# Patient Record
Sex: Female | Born: 1990 | Race: Black or African American | Hispanic: No | Marital: Married | Smoking: Current every day smoker
Health system: Southern US, Community
[De-identification: ages and names within clinical notes are randomized; demographics above are authoritative.]

## PROBLEM LIST (undated history)

## (undated) ENCOUNTER — Inpatient Hospital Stay (HOSPITAL_COMMUNITY): Payer: Self-pay

## (undated) DIAGNOSIS — R7689 Other specified abnormal immunological findings in serum: Secondary | ICD-10-CM

## (undated) DIAGNOSIS — O24419 Gestational diabetes mellitus in pregnancy, unspecified control: Secondary | ICD-10-CM

## (undated) DIAGNOSIS — Z8759 Personal history of other complications of pregnancy, childbirth and the puerperium: Secondary | ICD-10-CM

## (undated) DIAGNOSIS — R51 Headache: Secondary | ICD-10-CM

## (undated) DIAGNOSIS — Z8619 Personal history of other infectious and parasitic diseases: Secondary | ICD-10-CM

## (undated) HISTORY — PX: WISDOM TOOTH EXTRACTION: SHX21

---
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
## 2012-03-23 ENCOUNTER — Encounter (HOSPITAL_COMMUNITY): Payer: Self-pay

## 2012-03-23 ENCOUNTER — Inpatient Hospital Stay (HOSPITAL_COMMUNITY)
Admission: AD | Admit: 2012-03-23 | Discharge: 2012-03-23 | Disposition: A | Discharge status: Home / Self Care | Payer: Medicaid Other | Source: Ambulatory Visit | Attending: Obstetrics & Gynecology | Admitting: Obstetrics & Gynecology

## 2012-03-23 DIAGNOSIS — O99891 Other specified diseases and conditions complicating pregnancy: Secondary | ICD-10-CM | POA: Insufficient documentation

## 2012-03-23 DIAGNOSIS — Z3201 Encounter for pregnancy test, result positive: Secondary | ICD-10-CM

## 2012-03-23 NOTE — Discharge Instructions (Signed)
Prenatal Care  WHAT IS PRENATAL CARE?  Prenatal care means health care during your pregnancy, before your baby is born. Take care of yourself and your baby by:   Getting early prenatal care. If you know you are pregnant, or think you might be pregnant, call your caregiver as soon as possible. Schedule a visit for a general/prenatal examination.   Getting regular prenatal care. Follow your caregiver's schedule for blood and other necessary tests. Do not miss appointments.   Do everything you can to keep yourself and your baby healthy during your pregnancy.   Prenatal care should include evaluation of medical, dietary, educational, psychological, and social needs for the couple and the medical, surgical, and genetic history of the family of the mother and father.   Discuss with your caregiver:   Your medicines, prescription, over-the-counter, and herbal medicines.   Substance abuse, alcohol, smoking, and illegal drugs.   Domestic abuse and violence, if present.   Your immunizations.   Nutrition and diet.   Exercising.   Environment and occupational hazards, at home and at work.   History of sexually transmitted disease, both you and your partner.   Previous pregnancies.  WHY IS PRENATAL CARE SO IMPORTANT?  By seeing you regularly, your caregiver has the chance to find problems early, so that they can be treated as soon as possible. Other problems might be prevented. Many studies have shown that early and regular prenatal care is important for the health of both mothers and their babies.  I AM THINKING ABOUT GETTING PREGNANT. HOW CAN I TAKE CARE OF MYSELF?  Taking care of yourself before you get pregnant helps you to have a healthy pregnancy. It also lowers your chances of having a baby born with a birth defect. Here are ways to take care of yourself before you get pregnant:   Eat healthy foods, exercise regularly (30 minutes per day for most days of the week is best), and get enough  rest and sleep. Talk to your caregiver about what kinds of foods and exercises are best for you.   Take 400 micrograms (mcg) of folic acid (one of the B vitamins) every day. The best way to do this is to take a daily multivitamin pill that contains this amount of folic acid. Getting enough of the synthetic (manufactured) form of folic acid every day before you get pregnant and during early pregnancy can help prevent certain birth defects. Many breakfast cereals and other grain products have folic acid added to them, but only certain cereals contain 400 mcg of folic acid per serving. Check the label on your multivitamin or cereal to find the amount of folic acid in the food.   See your caregiver for a complete check up before getting pregnant. Make sure that you have had all your immunization shots, especially for rubella (German measles). Rubella can cause serious birth defects. Chickenpox is another illness you want to avoid during pregnancy. If you have had chickenpox and rubella in the past, you should be immune to them.   Tell your caregiver about any prescription or non-prescription medicines (including herbal remedies) you are taking. Some medicines are not safe to take during pregnancy.   Stop smoking cigarettes, drinking alcohol, or taking illegal drugs. Ask your caregiver for help, if you need it. You can also get help with alcohol and drugs by talking with a member of your faith community, a counselor, or a trusted friend.   Discuss and treat any medical, social, or psychological   problems before getting pregnant.   Discuss any history of genetic problems in the mother, father, and their families. Do genetic testing before getting pregnant, when possible.   Discuss any physical or emotional abuse with your caregiver.   Discuss with your caregiver if you might be exposed to harmful chemicals on your job or where you live.   Discuss with your caregiver if you think your job or the hours you  work may be harmful and should be changed.   The father should be involved with the decision making and with all aspects of the pregnancy, labor, and delivery.   If you have medical insurance, make sure you are covered for pregnancy.  I JUST FOUND OUT THAT I AM PREGNANT. HOW CAN I TAKE CARE OF MYSELF?  Here are ways to take care of yourself and the precious new life growing inside you:   Continue taking your multivitamin with 400 micrograms (mcg) of folic acid every day.   Get early and regular prenatal care. It does not matter if this is your first pregnancy or if you already have children. It is very important to see a caregiver during your pregnancy. Your caregiver will check at each visit to make sure that you and the baby are healthy. If there are any problems, action can be taken right away to help you and the baby.   Eat a healthy diet that includes:   Fruits.   Vegetables.   Foods low in saturated fat.   Grains.   Calcium-rich foods.   Drink 6 to 8 glasses of liquids a day.   Unless your caregiver tells you not to, try to be physically active for 30 minutes, most days of the week. If you are pressed for time, you can get your activity in through 10 minute segments, three times a day.   If you smoke, drink alcohol, or use drugs, STOP. These can cause long-term damage to your baby. Talk with your caregiver about steps to take to stop smoking. Talk with a member of your faith community, a counselor, a trusted friend, or your caregiver if you are concerned about your alcohol or drug use.   Ask your caregiver before taking any medicine, even over-the-counter medicines. Some medicines are not safe to take during pregnancy.   Get plenty of rest and sleep.   Avoid hot tubs and saunas during pregnancy.   Do not have X-rays taken, unless absolutely necessary and with the recommendation of your caregiver. A lead shield can be placed on your abdomen, to protect the baby when X-rays  are taken in other parts of the body.   Do not empty the cat litter when you are pregnant. It may contain a parasite that causes an infection called toxoplasmosis, which can cause birth defects. Also, use gloves when working in garden areas used by cats.   Do not eat uncooked or undercooked cheese, meats, or fish.   Stay away from toxic chemicals like:   Insecticides.   Solvents (some cleaners or paint thinners).   Lead.   Mercury.   Sexual relations may continue until the end of the pregnancy, unless you have a medical problem or there is a problem with the pregnancy and your caregiver tells you not to.   Do not wear high heel shoes, especially during the second half of the pregnancy. You can lose your balance and fall.   Do not take long trips, unless absolutely necessary. Be sure to see your caregiver before   going on the trip.   Do not sit in one position for more than 2 hours, when on a trip.   Take a copy of your medical records when going on a trip.   Know where there is a hospital in the city you are visiting, in case of an emergency.   Most dangerous household products will have pregnancy warnings on their labels. Ask your caregiver about products if you are unsure.   Limit or eliminate your caffeine intake from coffee, tea, sodas, medicines, and chocolate.   Many women continue working through pregnancy. Staying active might help you stay healthier. If you have a question about the safety or the hours you work at your particular job, talk with your caregiver.   Get informed:   Read books.   Watch videos.   Go to childbirth classes for you and the father.   Talk with experienced moms.   Ask your caregiver about childbirth education classes for you and your partner. Classes can help you and your partner prepare for the birth of your baby.   Ask about a pediatrician (baby doctor) and methods and pain medicine for labor, delivery, and possible Cesarean delivery  (C-section).  I AM NOT THINKING ABOUT GETTING PREGNANT RIGHT NOW, BUT HEARD THAT ALL WOMEN SHOULD TAKE FOLIC ACID EVERY DAY?  All women of childbearing age, with even a remote chance of getting pregnant, should try to make sure they get enough folic acid. Many pregnancies are not planned. Many women do not know they are actually pregnant early in their pregnancies, and certain birth defects happen in the very early part of pregnancy. Taking 400 micrograms (mcg) of folic acid every day will help prevent certain birth defects that happen in the early part of pregnancy. If a woman begins taking vitamin pills in the second or third month of pregnancy, it may be too late to prevent birth defects. Folic acid may also have other health benefits for women, besides preventing birth defects.  HOW OFTEN SHOULD I SEE MY CAREGIVER DURING PREGNANCY?  Your caregiver will give you a schedule for your prenatal visits. You will have visits more often as you get closer to the end of your pregnancy. An average pregnancy lasts about 40 weeks.  A typical schedule includes visiting your caregiver:   About once each month, during your first 6 months of pregnancy.   Every 2 weeks, during the next 2 months.   Weekly in the last month, until the delivery date.  Your caregiver will probably want to see you more often if:  You are over 35.   Your pregnancy is high risk, because you have certain health problems or problems with the pregnancy, such as:   Diabetes.   High blood pressure.   The baby is not growing on schedule, according to the dates of the pregnancy.  Your caregiver will do special tests, to make sure you and the baby are not having any serious problems. WHAT HAPPENS DURING PRENATAL VISITS?   At your first prenatal visit, your caregiver will talk to you about you and your partner's health history and your family's health history, and will do a physical exam.   On your first visit, a physical exam will  include checks of your blood pressure, height and weight, and an exam of your pelvic organs. Your caregiver will do a Pap test if you have not had one recently, and will do cultures of your cervix to make sure there is no   infection.   At each visit, there will be tests of your blood, urine, blood pressure, weight, and checking the progress of the baby.   Your caregiver will be able to tell you when to expect that your baby will be born.   Each visit is also a chance for you to learn about staying healthy during pregnancy and for asking questions.   Discuss whether you will be breastfeeding.   At your later prenatal visits, your caregiver will check how you are doing and how the baby is developing. You may have a number of tests done as your pregnancy progresses.   Ultrasound exams are often used to check on the baby's growth and health.   You may have more urine and blood tests, as well as special tests, if needed. These may include amniocentesis (examine fluid in the pregnancy sac), stress tests (check how baby responds to contractions), biophysical profile (measures fetus well-being). Your caregiver will explain the tests and why they are necessary.  I AM IN MY LATE THIRTIES, AND I WANT TO HAVE A CHILD NOW. SHOULD I DO ANYTHING SPECIAL?  As you get older, there is more chance of having a medical problem (high blood pressure), pregnancy problem (preeclampsia, problems with the placenta), miscarriage, or a baby born with a birth defect. However, most women in their late thirties and early forties have healthy babies. See your caregiver on a regular basis before you get pregnant and be sure to go for exams throughout your pregnancy. Your caregiver probably will want to do some special tests to check on you and your baby's health when you are pregnant.  Women today are often delaying having children until later in life, when they are in their thirties and forties. While many women in their thirties  and forties have no difficulty getting pregnant, fertility does decline with age. For women over 40 who cannot get pregnant after 6 months of trying, it is recommended that they see their caregiver for a fertility evaluation. It is not uncommon to have trouble becoming pregnant or experience infertility (inability to become pregnant after trying for one year). If you think that you or your partner may be infertile, you can discuss this with your caregiver. He or she can recommend treatments such as drugs, surgery, or assisted reproductive technology.  Document Released: 10/13/2003 Document Revised: 09/29/2011 Document Reviewed: 09/09/2009 [employer] Patient Information 2012 [employer] 

## 2012-03-23 NOTE — ED Provider Notes (Signed)
This is a 21 y.o. female at [redacted]w[redacted]d who presents for Proof of Pregnancy letter, in order to get her Medicaid.  She has no complaints other than breast tenderness.   She has not chosen a prenatal provider yet.   Letter was provided.

## 2012-03-23 NOTE — MAU Note (Signed)
Patient states she has done a home pregnancy test that was positive. Wants proof of pregnancy. Denies any pain or bleeding. Does have tender breasts.

## 2012-04-28 ENCOUNTER — Emergency Department (HOSPITAL_COMMUNITY)
Admission: EM | Admit: 2012-04-28 | Discharge: 2012-04-28 | Disposition: A | Discharge status: Home / Self Care | Payer: Medicaid Other | Attending: Emergency Medicine | Admitting: Emergency Medicine

## 2012-04-28 ENCOUNTER — Encounter (HOSPITAL_COMMUNITY): Payer: Self-pay | Admitting: *Deleted

## 2012-04-28 DIAGNOSIS — O99891 Other specified diseases and conditions complicating pregnancy: Secondary | ICD-10-CM | POA: Insufficient documentation

## 2012-04-28 DIAGNOSIS — L03317 Cellulitis of buttock: Secondary | ICD-10-CM | POA: Insufficient documentation

## 2012-04-28 DIAGNOSIS — L0231 Cutaneous abscess of buttock: Secondary | ICD-10-CM | POA: Insufficient documentation

## 2012-04-28 DIAGNOSIS — O99330 Smoking (tobacco) complicating pregnancy, unspecified trimester: Secondary | ICD-10-CM | POA: Insufficient documentation

## 2012-04-28 DIAGNOSIS — Z3490 Encounter for supervision of normal pregnancy, unspecified, unspecified trimester: Secondary | ICD-10-CM

## 2012-04-28 MED ORDER — LIDOCAINE HCL (PF) 1 % IJ SOLN
5.0000 mL | Freq: Once | INTRAMUSCULAR | Status: AC
  Administered 2012-04-28: 5 mL
  Filled 2012-04-28: qty 5

## 2012-04-28 MED ORDER — OXYCODONE-ACETAMINOPHEN 5-325 MG PO TABS
1.0000 | ORAL_TABLET | Freq: Once | ORAL | Status: AC
  Administered 2012-04-28: 1 via ORAL
  Filled 2012-04-28: qty 1

## 2012-04-28 MED ORDER — SULFAMETHOXAZOLE-TMP DS 800-160 MG PO TABS
1.0000 | ORAL_TABLET | Freq: Once | ORAL | Status: AC
  Administered 2012-04-28: 1 via ORAL
  Filled 2012-04-28: qty 1

## 2012-04-28 MED ORDER — SULFAMETHOXAZOLE-TRIMETHOPRIM 800-160 MG PO TABS: 1.0000 | ORAL_TABLET | Freq: Two times a day (BID) | ORAL | Status: DC

## 2012-04-28 MED ORDER — OXYCODONE-ACETAMINOPHEN 5-325 MG PO TABS: 1.0000 | ORAL_TABLET | ORAL | Status: DC | PRN

## 2012-04-28 NOTE — ED Provider Notes (Signed)
History     CSN: [id number]  Arrival date & time 04/28/12  1305   First MD Initiated Contact with Patient 04/28/12 1511      Chief Complaint  Patient presents with  . Recurrent Skin Infections    (Consider location/radiation/quality/duration/timing/severity/associated sxs/prior treatment) The history is provided by the patient.   old Kokesh female is currently pregnant and has noted an area of pain in her right buttock. Pain is has been getting progressively worse. It is worse with palpation worse with sitting on it. Pain is severe and rated at 10/10. She denies fever, chills, sweats. She thinks it is a boil and she has been applying  Boil-Eze with no relief.  Past Medical History  Diagnosis Date  . No pertinent past medical history     Past Surgical History  Procedure Date  . Wisdom tooth extraction     Family History  Problem Relation Age of Onset  . Hypotension Neg Hx     History  Substance Use Topics  . Smoking status: Current Everyday Smoker -- 0.5 packs/day    Types: Cigarettes  . Smokeless tobacco: Not on file  . Alcohol Use: No    OB History    Grav Para Term Preterm Abortions TAB SAB Ect Mult Living   1               Review of Systems  All other systems reviewed and are negative.    Allergies  Review of patient's allergies indicates no known allergies.  Home Medications   Current Outpatient Rx  Name Route Sig Dispense Refill  . ONDANSETRON HCL 8 MG PO TABS Oral Take 8 mg by mouth every 8 (eight) hours as needed. For nausea    . PRENATAL MULTIVITAMIN CH Oral Take 1 tablet by mouth daily.      BP 119/67  Pulse 118  Temp 99 F (37.2 C)  Resp 12  SpO2 100%  LMP 02/16/2012  Physical Exam  Nursing note and vitals reviewed.  Demir female appears uncomfortable. Vital signs are significant for tachycardia with heart rate 118. Oxygen saturation is 100% which is normal. Head is normocephalic and atraumatic. PERRLA, EOMI. Oropharynx is clear. Neck  is nontender. Back is nontender. Lungs are clear without rales, wheezes, rhonchi. Heart has regular rhythm without murmur. Abdomen is soft, flat, nontender without masses or hepatosplenomegaly. Extremities have no cyanosis or edema, full range of motion is present. Skin: There is mild erythema of the skin over the inferior aspect of the right buttock. Area is very tender, but I cannot detect any induration or fluctuance. Neurologic: Mental status is normal, cranial nerves are intact, there are no motor or sensory deficits.  ED Course  Procedures (including critical care time)    1. Abscess of right buttock   2. Pregnancy       MDM  Buttock pain which may represent a gluteal abscess. Limited bedside ultrasound will be done to see if there is an abscess cavity that can be incised and drained.    Ultrasound confirms abscess cavity and I will proceed with incision and drainage. Images were saved on ultrasound machine.   INCISION AND DRAINAGE Performed by: NWGNF,AOZHY Consent: Verbal consent obtained. Risks and benefits: risks, benefits and alternatives were discussed Type: abscess  Body area: right buttock  Anesthesia: local infiltration  Local anesthetic: lidocaine 2% without epinephrine  Anesthetic total: 5 ml  Complexity: complex Blunt dissection to break up loculations  Drainage: purulent  Drainage amount: moderate  Packing material: 1/4 in iodoform gauze  Patient tolerance: Patient tolerated the procedure well with no immediate complications.    She is sent home with prescriptions for Percocet and Bactrim DS. Bactrim is acceptable for use in first trimester of pregnancy, and doxycycline is contraindicated.   Dione Booze, MD 04/28/12 [phone]

## 2012-04-28 NOTE — ED Notes (Signed)
Patient with abcess to her right buttock area.  Wound is closed and not draining

## 2012-04-30 ENCOUNTER — Encounter (HOSPITAL_COMMUNITY): Payer: Self-pay | Admitting: Emergency Medicine

## 2012-04-30 ENCOUNTER — Emergency Department (HOSPITAL_COMMUNITY)
Admission: EM | Admit: 2012-04-30 | Discharge: 2012-04-30 | Disposition: A | Discharge status: Home / Self Care | Payer: Medicaid Other | Attending: Emergency Medicine | Admitting: Emergency Medicine

## 2012-04-30 DIAGNOSIS — Z4801 Encounter for change or removal of surgical wound dressing: Secondary | ICD-10-CM | POA: Insufficient documentation

## 2012-04-30 DIAGNOSIS — Z4800 Encounter for change or removal of nonsurgical wound dressing: Secondary | ICD-10-CM

## 2012-04-30 DIAGNOSIS — F17200 Nicotine dependence, unspecified, uncomplicated: Secondary | ICD-10-CM | POA: Insufficient documentation

## 2012-04-30 LAB — OB RESULTS CONSOLE HIV ANTIBODY (ROUTINE TESTING): HIV: NONREACTIVE

## 2012-04-30 LAB — OB RESULTS CONSOLE RUBELLA ANTIBODY, IGM: Rubella: IMMUNE

## 2012-04-30 LAB — OB RESULTS CONSOLE RPR: RPR: NONREACTIVE

## 2012-04-30 LAB — OB RESULTS CONSOLE HEPATITIS B SURFACE ANTIGEN: Hepatitis B Surface Ag: NEGATIVE

## 2012-04-30 NOTE — ED Notes (Signed)
Pt here for wound recheck; pt had I/D of abscess to buttocks 2 days ago

## 2012-04-30 NOTE — ED Provider Notes (Signed)
History     CSN: [id number]  Arrival date & time 04/30/12  1240   First MD Initiated Contact with Patient 04/30/12 1556      Chief Complaint  Patient presents with  . Wound Check    (Consider location/radiation/quality/duration/timing/severity/associated sxs/prior treatment) HPI 21 year old female returns for removal of packing from a gluteal abscess. Abscess was incised and drained 2 days ago. She states pain is significantly improved. Past Medical History  Diagnosis Date  . No pertinent past medical history     Past Surgical History  Procedure Date  . Wisdom tooth extraction     Family History  Problem Relation Age of Onset  . Hypotension Neg Hx     History  Substance Use Topics  . Smoking status: Current Everyday Smoker -- 0.5 packs/day    Types: Cigarettes  . Smokeless tobacco: Not on file  . Alcohol Use: No    OB History    Grav Para Term Preterm Abortions TAB SAB Ect Mult Living   1               Review of Systems  Allergies  Review of patient's allergies indicates no known allergies.  Home Medications   Current Outpatient Rx  Name Route Sig Dispense Refill  . ONDANSETRON HCL 8 MG PO TABS Oral Take 8 mg by mouth every 8 (eight) hours as needed. For nausea    . PRENATAL MULTIVITAMIN CH Oral Take 1 tablet by mouth daily.      BP 108/62  Pulse 102  Temp 99.1 F (37.3 C) (Oral)  SpO2 100%  LMP 02/16/2012  Physical Exam 21 oh female resting comfortably in no acute distress. Vital signs are significant for borderline tachycardia with heart rate 102. Oxygen saturation is 100% which is normal. Head is normocephalic and atraumatic. PERRLA, EOMI. Her Chalmers Guest is clear. Neck is nontender and supple. Back is nontender. Lungs are clear. Heart has regular rate rhythm without murmur. Abdomen is soft, flat, nontender. The abscess in the right buttock appears to be healing appropriately. There is no erythema and no active drainage. Packing is removed without  difficulty. Extremities have no cyanosis or edema. Skin is warm and dry without rash. Neurologic: Mental status is normal, cranial nerves are intact, there are no motor or sensory deficits. ED Course  Procedures (including critical care time)  Labs Reviewed - No data to display No results found.   1. Abscess packing removal       MDM  Right gluteal abscess with packing removed today. Incision site is remaining patent so it should continue to train. She is advised to start using warm soaks. She is to followup with her PCP in 4 days.        Dione Booze, MD 04/30/12 [phone]

## 2012-04-30 NOTE — ED Notes (Signed)
MD at bedside. 

## 2012-07-08 ENCOUNTER — Encounter (HOSPITAL_COMMUNITY): Payer: Self-pay | Admitting: *Deleted

## 2012-07-08 ENCOUNTER — Inpatient Hospital Stay (HOSPITAL_COMMUNITY)
Admission: AD | Admit: 2012-07-08 | Discharge: 2012-07-08 | Disposition: A | Discharge status: Home / Self Care | Payer: Medicaid Other | Source: Ambulatory Visit | Attending: Obstetrics and Gynecology | Admitting: Obstetrics and Gynecology

## 2012-07-08 DIAGNOSIS — M7918 Myalgia, other site: Secondary | ICD-10-CM

## 2012-07-08 DIAGNOSIS — O99891 Other specified diseases and conditions complicating pregnancy: Secondary | ICD-10-CM | POA: Insufficient documentation

## 2012-07-08 DIAGNOSIS — IMO0001 Reserved for inherently not codable concepts without codable children: Secondary | ICD-10-CM

## 2012-07-08 DIAGNOSIS — M549 Dorsalgia, unspecified: Secondary | ICD-10-CM | POA: Insufficient documentation

## 2012-07-08 NOTE — MAU Provider Note (Signed)
  History     CSN: [id number]  Arrival date and time: 07/08/12 1155   None     Chief Complaint  Patient presents with  . Back Pain   HPI Angela Henson 21 y.o. [redacted]w[redacted]d  After walking yesterday in the park, had pain in upper right back.  Noticed this morning when turning to the right side in bed, had pain again in back just under her shoulder blade.  After walking around at home, the pain went away.  Came in to make sure everything is OK.  Did not call MD before coming to hospital.  OB History    Grav Para Term Preterm Abortions TAB SAB Ect Mult Living   1               Past Medical History  Diagnosis Date  . No pertinent past medical history     Past Surgical History  Procedure Date  . Wisdom tooth extraction     Family History  Problem Relation Age of Onset  . Hypotension Neg Hx     History  Substance Use Topics  . Smoking status: Current Every Day Smoker -- 0.5 packs/day    Types: Cigarettes  . Smokeless tobacco: Not on file  . Alcohol Use: No    Allergies: No Known Allergies  Prescriptions prior to admission  Medication Sig Dispense Refill  . acetaminophen (TYLENOL) 500 MG tablet Take 500 mg by mouth daily as needed. For headache pain      . Prenatal Vit-Fe Fumarate-FA (PRENATAL MULTIVITAMIN) TABS Take 1 tablet by mouth daily.        Review of Systems  Constitutional: Negative for fever.  Gastrointestinal: Negative for nausea, vomiting, abdominal pain, diarrhea and constipation.  Genitourinary:       No vaginal discharge. No vaginal bleeding. No dysuria.   Physical Exam   Blood pressure 99/53, pulse 110, temperature 98.6 F (37 C), temperature source Oral, resp. rate 18, height 5\' 2"  (1.575 m), weight 46.72 kg (103 lb), last menstrual period 02/16/2012.  Physical Exam  Nursing note and vitals reviewed. Constitutional: She is oriented to person, place, and time. She appears well-developed and well-nourished.  HENT:  Head: Normocephalic.  Eyes: EOM  are normal.  Neck: Neck supple.  GI: Soft. There is no tenderness.       FHT 148 by doppler  Musculoskeletal: Normal range of motion.       No pain in back at present.  Nontender to palpation.  Pain previously was in upper right back under shoulder blade.  No CVA tenderness bilaterally.  Neurological: She is alert and oriented to person, place, and time.  Skin: Skin is warm and dry.  Psychiatric: She has a normal mood and affect.    MAU Course  Procedures  MDM 1255  Reviewed plan of care with Dr. Ambrose Mantle  Assessment and Plan  Musculoskeletal pain  Plan Take Tylenol 325 mg 2 tablets by mouth every 4 hours if needed for pain. Drink at least 8 8-oz glasses of water every day. Call your doctor if you have questions or concerns. Joram Venson 07/08/2012, 12:51 PM

## 2012-07-08 NOTE — MAU Note (Signed)
Pt present for back pain that started yesterday and is "off and on" that started yesterday.  She describes the pain as a muscle spasm.

## 2012-07-10 LAB — URINE CULTURE

## 2012-08-23 LAB — OB RESULTS CONSOLE GBS: GBS: NEGATIVE

## 2012-10-17 ENCOUNTER — Encounter (HOSPITAL_COMMUNITY): Payer: Self-pay

## 2012-10-17 ENCOUNTER — Inpatient Hospital Stay (HOSPITAL_COMMUNITY)
Admission: AD | Admit: 2012-10-17 | Discharge: 2012-10-17 | Disposition: A | Discharge status: Home / Self Care | Payer: Medicaid Other | Source: Ambulatory Visit | Attending: Obstetrics and Gynecology | Admitting: Obstetrics and Gynecology

## 2012-10-17 DIAGNOSIS — O479 False labor, unspecified: Secondary | ICD-10-CM | POA: Insufficient documentation

## 2012-10-17 DIAGNOSIS — O4700 False labor before 37 completed weeks of gestation, unspecified trimester: Secondary | ICD-10-CM

## 2012-10-17 DIAGNOSIS — O4702 False labor before 37 completed weeks of gestation, second trimester: Secondary | ICD-10-CM

## 2012-10-17 DIAGNOSIS — R109 Unspecified abdominal pain: Secondary | ICD-10-CM | POA: Insufficient documentation

## 2012-10-17 DIAGNOSIS — M545 Low back pain, unspecified: Secondary | ICD-10-CM | POA: Insufficient documentation

## 2012-10-17 LAB — URINALYSIS, ROUTINE W REFLEX MICROSCOPIC
Ketones, ur: NEGATIVE mg/dL
Nitrite: NEGATIVE
Specific Gravity, Urine: 1.015 (ref 1.005–1.030)
pH: 8 (ref 5.0–8.0)

## 2012-10-17 LAB — URINE MICROSCOPIC-ADD ON

## 2012-10-17 NOTE — MAU Provider Note (Signed)
History     CSN: [id number]  Arrival date and time: 10/17/12 1737   First Provider Initiated Contact with Patient 10/17/12 1820      Chief Complaint  Patient presents with  . Back Pain  . Abdominal Pain   HPI Angela Henson is a 21 y.o. @ [redacted]w[redacted]d gestation who presents to MAU with abdominal pain. The pain radiates to the lower back. The pain started today. The history was provided by the patient.  OB History    Grav Para Term Preterm Abortions TAB SAB Ect Mult Living   1               Past Medical History  Diagnosis Date  . No pertinent past medical history     Past Surgical History  Procedure Date  . Wisdom tooth extraction     Family History  Problem Relation Age of Onset  . Hypotension Neg Hx     History  Substance Use Topics  . Smoking status: Former Smoker -- 0.5 packs/day    Types: Cigarettes  . Smokeless tobacco: Not on file  . Alcohol Use: No    Allergies: No Known Allergies  Prescriptions prior to admission  Medication Sig Dispense Refill  . acetaminophen (TYLENOL) 500 MG tablet Take 500 mg by mouth daily as needed. For headache pain      . benzocaine-resorcinol (VAGISIL) 5-2 % vaginal cream Place 1 application vaginally as needed. For itchiness      . Prenatal Vit-Fe Fumarate-FA (PRENATAL MULTIVITAMIN) TABS Take 1 tablet by mouth daily.        Review of Systems  Constitutional: Negative for fever and chills.  Eyes: Negative for blurred vision.  Gastrointestinal: Positive for abdominal pain (cramping). Negative for nausea and vomiting.  Genitourinary: Positive for frequency. Negative for dysuria and urgency.  Musculoskeletal: Positive for back pain.  Neurological: Negative for headaches.  Psychiatric/Behavioral: Negative for depression. The patient is not nervous/anxious.    Physical Exam   Blood pressure 128/73, pulse 108, temperature 98.1 F (36.7 C), temperature source Oral, resp. rate 16, height 5\' 1"  (1.549 m), weight 121 lb 6.4 oz  (55.067 kg), last menstrual period 02/16/2012, SpO2 100.00%.  Physical Exam  Nursing note and vitals reviewed. Constitutional: She is oriented to person, place, and time. She appears well-developed and well-nourished. No distress.  HENT:  Head: Normocephalic and atraumatic.  Eyes: EOM are normal.  Neck: Neck supple.  Cardiovascular: Normal rate.   Respiratory: Effort normal.  GI:       Gravid consistent with dates  Genitourinary:       Dilation: Closed Effacement (%): Thick Cervical Position: Posterior Exam by:: Peace, rn and Hope, np   Musculoskeletal: Normal range of motion.  Neurological: She is alert and oriented to person, place, and time.  Skin: Skin is warm and dry.  Psychiatric: She has a normal mood and affect. Her behavior is normal. Judgment and thought content normal.    Results for orders placed during the hospital encounter of 10/17/12 (from the past 24 hour(s))  URINALYSIS, ROUTINE W REFLEX MICROSCOPIC     Status: Abnormal   Collection Time   10/17/12  5:55 PM      Component Value Range   Color, Urine YELLOW  YELLOW   APPearance CLEAR  CLEAR   Specific Gravity, Urine 1.015  1.005 - 1.030   pH 8.0  5.0 - 8.0   Glucose, UA 100 (*) NEGATIVE mg/dL   Hgb urine dipstick NEGATIVE  NEGATIVE  Bilirubin Urine NEGATIVE  NEGATIVE   Ketones, ur NEGATIVE  NEGATIVE mg/dL   Protein, ur NEGATIVE  NEGATIVE mg/dL   Urobilinogen, UA 0.2  0.0 - 1.0 mg/dL   Nitrite NEGATIVE  NEGATIVE   Leukocytes, UA SMALL (*) NEGATIVE  URINE MICROSCOPIC-ADD ON     Status: Abnormal   Collection Time   10/17/12  5:55 PM      Component Value Range   Squamous Epithelial / LPF FEW (*) RARE   WBC, UA 3-6  <3 WBC/hpf   Bacteria, UA FEW (*) RARE   MAU Course: discussed with Dr. Jackelyn Knife and will d/c home to follow up in the office  Procedures EFM baseline FH 140, reactive, 2 contractions in 30 minutes  Assessment: 21 y.o. female @ [redacted]w[redacted]d with Braxton Hicks contractions  Plan:  PO  hydration   Follow up in the office, return as needed Discussed with the patient and all questioned fully answered. She will return if any problems arise. NEESE,HOPE, RN, FNP, [facility] 10/17/2012, 6:20 PM

## 2012-10-17 NOTE — MAU Note (Signed)
Patient is in with c/o intermittent of lower back and abdominal tightening since morning that have gotten more painful. She denies any vaginal bleeding or lof. She reports good fetal movement.

## 2012-10-17 NOTE — MAU Note (Signed)
Patient states she has been having lower back pain and lower abdominal pain since this morning, getting worse around 1400. Denies any bleeding or leaking and reports good fetal movement.

## 2012-10-19 LAB — URINE CULTURE

## 2012-10-19 NOTE — Progress Notes (Signed)
FHT from 12-25 reviewed.  Reactive NST, occ ctx.

## 2012-10-23 ENCOUNTER — Inpatient Hospital Stay (HOSPITAL_COMMUNITY)
Admission: AD | Admit: 2012-10-23 | Discharge: 2012-10-23 | Disposition: A | Discharge status: Home / Self Care | Payer: Medicaid Other | Source: Ambulatory Visit | Attending: Obstetrics and Gynecology | Admitting: Obstetrics and Gynecology

## 2012-10-23 ENCOUNTER — Encounter (HOSPITAL_COMMUNITY): Payer: Self-pay

## 2012-10-23 DIAGNOSIS — O2390 Unspecified genitourinary tract infection in pregnancy, unspecified trimester: Secondary | ICD-10-CM | POA: Insufficient documentation

## 2012-10-23 DIAGNOSIS — O4700 False labor before 37 completed weeks of gestation, unspecified trimester: Secondary | ICD-10-CM | POA: Insufficient documentation

## 2012-10-23 DIAGNOSIS — B3731 Acute candidiasis of vulva and vagina: Secondary | ICD-10-CM | POA: Insufficient documentation

## 2012-10-23 DIAGNOSIS — B373 Candidiasis of vulva and vagina: Secondary | ICD-10-CM

## 2012-10-23 DIAGNOSIS — O479 False labor, unspecified: Secondary | ICD-10-CM

## 2012-10-23 LAB — URINALYSIS, ROUTINE W REFLEX MICROSCOPIC
Glucose, UA: 100 mg/dL — AB
Nitrite: NEGATIVE
Protein, ur: NEGATIVE mg/dL
Urobilinogen, UA: 0.2 mg/dL (ref 0.0–1.0)

## 2012-10-23 LAB — WET PREP, GENITAL: Yeast Wet Prep HPF POC: NONE SEEN

## 2012-10-23 LAB — URINE MICROSCOPIC-ADD ON

## 2012-10-23 MED ORDER — FLUCONAZOLE 150 MG PO TABS
150.0000 mg | ORAL_TABLET | Freq: Once | ORAL | Status: AC
  Administered 2012-10-23: 150 mg via ORAL
  Filled 2012-10-23: qty 1

## 2012-10-23 NOTE — MAU Provider Note (Signed)
History     CSN: [id number]  Arrival date and time: 10/23/12 0230   None     Chief Complaint  Patient presents with  . Contractions   HPI 21 y.o. G1P0 at [redacted]w[redacted]d with ctx since 7:30 PM - tightening of abdomen - able to walk and talk through these comfortably. Woke at 2 PM with pressure - felt like had to have a BM.  Seen in MAU on 12/25 with same, but not as frequent.  Were back to back last evening now q 7 minutes or so. No vaginal bleeding, no loss of fluid. Baby moving well. Pt does state that she has thick Reiswig discharge like yeast and vaginal itching.  PNC with Dr. Jackelyn Knife or Ambrose Mantle, last visit a week ago. No complications with this pregnancy.   OB History    Grav Para Term Preterm Abortions TAB SAB Ect Mult Living   1               Past Medical History  Diagnosis Date  . No pertinent past medical history     Past Surgical History  Procedure Date  . Wisdom tooth extraction     Family History  Problem Relation Age of Onset  . Hypotension Neg Hx     History  Substance Use Topics  . Smoking status: Former Smoker -- 0.5 packs/day    Types: Cigarettes  . Smokeless tobacco: Not on file  . Alcohol Use: No    Allergies: No Known Allergies  Prescriptions prior to admission  Medication Sig Dispense Refill  . acetaminophen (TYLENOL) 500 MG tablet Take 500 mg by mouth daily as needed. For headache pain      . benzocaine-resorcinol (VAGISIL) 5-2 % vaginal cream Place 1 application vaginally as needed. For itchiness      . Prenatal Vit-Fe Fumarate-FA (PRENATAL MULTIVITAMIN) TABS Take 1 tablet by mouth daily.        Review of Systems  Constitutional: Negative for fever and chills.  Eyes: Negative for blurred vision and double vision.  Gastrointestinal: Negative for nausea, vomiting, diarrhea and constipation.  Genitourinary: Negative for dysuria and urgency.  Neurological: Negative for headaches.   Physical Exam   Blood pressure 129/81, pulse 115, temperature  98.4 F (36.9 C), temperature source Oral, resp. rate 20, height 5\' 1"  (1.549 m), weight 54.704 kg (120 lb 9.6 oz), last menstrual period 02/16/2012, SpO2 100.00%.  Physical Exam  Constitutional: She is oriented to person, place, and time. She appears well-developed and well-nourished. No distress.  HENT:  Head: Normocephalic and atraumatic.  Eyes: Conjunctivae normal and EOM are normal.  Neck: Normal range of motion. Neck supple.  Cardiovascular: Regular rhythm and normal heart sounds.        tachycardic  Respiratory: Effort normal and breath sounds normal. No respiratory distress.  GI: Soft. There is no tenderness. There is no rebound and no guarding.  Genitourinary:       Exam limited by patient anxiety - very tense, unable to relax.  External genitalia normal. Normal vagina. Thick, curdy Clary discharge in vaginal vault. Difficult to visualize cervix. Digital cervical exam limited by pt lack of cooperation. Cervix feels long, high, fingertip external os.   Musculoskeletal: Normal range of motion. She exhibits no edema and no tenderness.  Neurological: She is alert and oriented to person, place, and time.  Skin: Skin is warm and dry.  Psychiatric: She has a normal mood and affect.   Results for orders placed during the hospital encounter of  10/23/12 (from the past 24 hour(s))  URINALYSIS, ROUTINE W REFLEX MICROSCOPIC     Status: Abnormal   Collection Time   10/23/12  3:30 AM      Component Value Range   Color, Urine YELLOW  YELLOW   APPearance CLEAR  CLEAR   Specific Gravity, Urine 1.015  1.005 - 1.030   pH 8.5 (*) 5.0 - 8.0   Glucose, UA 100 (*) NEGATIVE mg/dL   Hgb urine dipstick NEGATIVE  NEGATIVE   Bilirubin Urine NEGATIVE  NEGATIVE   Ketones, ur NEGATIVE  NEGATIVE mg/dL   Protein, ur NEGATIVE  NEGATIVE mg/dL   Urobilinogen, UA 0.2  0.0 - 1.0 mg/dL   Nitrite NEGATIVE  NEGATIVE   Leukocytes, UA TRACE (*) NEGATIVE  WET PREP, GENITAL     Status: Abnormal   Collection Time    10/23/12  3:30 AM      Component Value Range   Yeast Wet Prep HPF POC NONE SEEN  NONE SEEN   Trich, Wet Prep NONE SEEN  NONE SEEN   Clue Cells Wet Prep HPF POC NONE SEEN  NONE SEEN   WBC, Wet Prep HPF POC FEW (*) NONE SEEN  URINE MICROSCOPIC-ADD ON     Status: Abnormal   Collection Time   10/23/12  3:30 AM      Component Value Range   Squamous Epithelial / LPF FEW (*) RARE   WBC, UA 3-6  <3 WBC/hpf   Bacteria, UA RARE  RARE    FHTs: 135, moderate variability, accels present, no decels TOCO:  Ctx q 6-7 minutes  MAU Course  Procedures   Assessment and Plan  21 y.o. G1P0 at [redacted]w[redacted]d with Braxton Hicks contractions - Contractions are regular but mild, no cervical dilation - Yeast by clinical exam with symptoms (itching) though negative on wet prep. Will treat with diflucan x 1 given symptoms. - Encouraged good hydration. F/U with OB as scheduled. - Discussed with Dr. Jackelyn Knife - home with labor precautions.  Napoleon Form 10/23/2012, 3:18 AM

## 2012-10-23 NOTE — MAU Note (Signed)
Pt states that the pressure and contractions started around 730pm. Pt states it felt as if she had to have a bowel movement but could not go.

## 2012-10-23 NOTE — Progress Notes (Signed)
FHT from this am reviewed.  Reactive NST, irreg ctx. 

## 2012-10-31 ENCOUNTER — Inpatient Hospital Stay (HOSPITAL_COMMUNITY)
Admission: AD | Admit: 2012-10-31 | Discharge: 2012-11-01 | Disposition: A | Discharge status: Home / Self Care | Payer: Medicaid Other | Source: Ambulatory Visit | Attending: Obstetrics and Gynecology | Admitting: Obstetrics and Gynecology

## 2012-10-31 ENCOUNTER — Encounter (HOSPITAL_COMMUNITY): Payer: Self-pay

## 2012-10-31 DIAGNOSIS — O99891 Other specified diseases and conditions complicating pregnancy: Secondary | ICD-10-CM | POA: Insufficient documentation

## 2012-10-31 DIAGNOSIS — M545 Low back pain, unspecified: Secondary | ICD-10-CM | POA: Insufficient documentation

## 2012-10-31 DIAGNOSIS — R109 Unspecified abdominal pain: Secondary | ICD-10-CM | POA: Insufficient documentation

## 2012-10-31 NOTE — MAU Note (Signed)
Pt reports pain in lower abd and lower back x 21 hours, denies ROM, brownish discharge.

## 2012-11-01 ENCOUNTER — Encounter (HOSPITAL_COMMUNITY): Payer: Self-pay | Admitting: *Deleted

## 2012-11-01 ENCOUNTER — Inpatient Hospital Stay (HOSPITAL_COMMUNITY)
Admission: AD | Admit: 2012-11-01 | Discharge: 2012-11-05 | DRG: 765 | Disposition: A | Discharge status: Home / Self Care | Payer: Medicaid Other | Source: Ambulatory Visit | Attending: Obstetrics and Gynecology | Admitting: Obstetrics and Gynecology

## 2012-11-01 DIAGNOSIS — O42919 Preterm premature rupture of membranes, unspecified as to length of time between rupture and onset of labor, unspecified trimester: Secondary | ICD-10-CM | POA: Diagnosis present

## 2012-11-01 DIAGNOSIS — O4290 Premature rupture of membranes, unspecified as to length of time between rupture and onset of labor, unspecified weeks of gestation: Principal | ICD-10-CM | POA: Diagnosis present

## 2012-11-01 DIAGNOSIS — IMO0002 Reserved for concepts with insufficient information to code with codable children: Secondary | ICD-10-CM | POA: Diagnosis present

## 2012-11-01 HISTORY — DX: Headache: R51

## 2012-11-01 LAB — CBC
MCHC: 33.2 g/dL (ref 30.0–36.0)
RDW: 12.9 % (ref 11.5–15.5)

## 2012-11-01 LAB — RPR: RPR Ser Ql: NONREACTIVE

## 2012-11-01 MED ORDER — TERBUTALINE SULFATE 1 MG/ML IJ SOLN: 0.2500 mg | Freq: Once | INTRAMUSCULAR | Status: AC | PRN

## 2012-11-01 MED ORDER — IBUPROFEN 600 MG PO TABS: 600.0000 mg | ORAL_TABLET | Freq: Four times a day (QID) | ORAL | Status: DC | PRN

## 2012-11-01 MED ORDER — EPHEDRINE 5 MG/ML INJ
10.0000 mg | INTRAVENOUS | Status: DC | PRN
  Filled 2012-11-01: qty 4

## 2012-11-01 MED ORDER — PHENYLEPHRINE 40 MCG/ML (10ML) SYRINGE FOR IV PUSH (FOR BLOOD PRESSURE SUPPORT): 80.0000 ug | PREFILLED_SYRINGE | INTRAVENOUS | Status: DC | PRN

## 2012-11-01 MED ORDER — EPHEDRINE 5 MG/ML INJ: 10.0000 mg | INTRAVENOUS | Status: DC | PRN

## 2012-11-01 MED ORDER — OXYCODONE-ACETAMINOPHEN 5-325 MG PO TABS: 1.0000 | ORAL_TABLET | ORAL | Status: DC | PRN

## 2012-11-01 MED ORDER — DIPHENHYDRAMINE HCL 50 MG/ML IJ SOLN: 12.5000 mg | INTRAMUSCULAR | Status: DC | PRN

## 2012-11-01 MED ORDER — OXYTOCIN BOLUS FROM INFUSION: 500.0000 mL | INTRAVENOUS | Status: DC

## 2012-11-01 MED ORDER — LACTATED RINGERS IV SOLN: 500.0000 mL | Freq: Once | INTRAVENOUS | Status: DC

## 2012-11-01 MED ORDER — FENTANYL 2.5 MCG/ML BUPIVACAINE 1/10 % EPIDURAL INFUSION (WH - ANES)
14.0000 mL/h | INTRAMUSCULAR | Status: DC
  Administered 2012-11-02 – 2012-11-03 (×2): 14 mL/h via EPIDURAL
  Filled 2012-11-01 (×3): qty 125

## 2012-11-01 MED ORDER — LIDOCAINE HCL (PF) 1 % IJ SOLN: 30.0000 mL | INTRAMUSCULAR | Status: DC | PRN

## 2012-11-01 MED ORDER — OXYTOCIN 40 UNITS IN LACTATED RINGERS INFUSION - SIMPLE MED
1.0000 m[IU]/min | INTRAVENOUS | Status: DC
  Administered 2012-11-01: 1 m[IU]/min via INTRAVENOUS
  Administered 2012-11-02: 9 m[IU]/min via INTRAVENOUS
  Administered 2012-11-02: 7 m[IU]/min via INTRAVENOUS
  Filled 2012-11-01: qty 1000

## 2012-11-01 MED ORDER — ACETAMINOPHEN 325 MG PO TABS: 650.0000 mg | ORAL_TABLET | ORAL | Status: DC | PRN

## 2012-11-01 MED ORDER — LACTATED RINGERS IV SOLN
INTRAVENOUS | Status: DC
  Administered 2012-11-01: 125 mL/h via INTRAVENOUS
  Administered 2012-11-02 – 2012-11-03 (×4): via INTRAVENOUS

## 2012-11-01 MED ORDER — CITRIC ACID-SODIUM CITRATE 334-500 MG/5ML PO SOLN: 30.0000 mL | ORAL | Status: DC | PRN

## 2012-11-01 MED ORDER — PHENYLEPHRINE 40 MCG/ML (10ML) SYRINGE FOR IV PUSH (FOR BLOOD PRESSURE SUPPORT)
80.0000 ug | PREFILLED_SYRINGE | INTRAVENOUS | Status: DC | PRN
  Filled 2012-11-01: qty 5

## 2012-11-01 MED ORDER — LACTATED RINGERS IV SOLN
500.0000 mL | INTRAVENOUS | Status: DC | PRN
  Administered 2012-11-02: 500 mL via INTRAVENOUS

## 2012-11-01 MED ORDER — ONDANSETRON HCL 4 MG/2ML IJ SOLN: 4.0000 mg | Freq: Four times a day (QID) | INTRAMUSCULAR | Status: DC | PRN

## 2012-11-01 MED ORDER — OXYTOCIN 40 UNITS IN LACTATED RINGERS INFUSION - SIMPLE MED: 62.5000 mL/h | INTRAVENOUS | Status: DC

## 2012-11-01 NOTE — H&P (Signed)
Angela Henson is a 22 y.o. female, G1 P0, EGA 36+ weeks with EDC 2-5 presenting for PPROM.  Leaking fluid this am, seen in the office and ruptured membranes confirmed.  Prenatal care essentially uncomplicated, see prenatal records for complete history.  Maternal Medical History:  Reason for admission: Reason for admission: rupture of membranes.  Contractions: Frequency: irregular.   Perceived severity is moderate.    Fetal activity: Perceived fetal activity is normal.    Prenatal complications: no prenatal complications   OB History    Grav Para Term Preterm Abortions TAB SAB Ect Mult Living   1              Past Medical History  Diagnosis Date  . No pertinent past medical history    Past Surgical History  Procedure Date  . Wisdom tooth extraction    Family History: family history is negative for Hypotension. Social History:  reports that she has quit smoking. Her smoking use included Cigarettes. She smoked .5 packs per day. She does not have any smokeless tobacco history on file. She reports that she uses illicit drugs (Marijuana). She reports that she does not drink alcohol.   Prenatal Transfer Tool  Maternal Diabetes: No Genetic Screening: Declined Maternal Ultrasounds/Referrals: Normal Fetal Ultrasounds or other Referrals:  None Maternal Substance Abuse:  No Significant Maternal Medications:  None Significant Maternal Lab Results:  Lab values include: Group B Strep negative Other Comments:  PPROM  Review of Systems  Respiratory: Negative.   Cardiovascular: Negative.       Blood pressure 140/81, pulse 110, temperature 98.3 F (36.8 C), temperature source Oral, resp. rate 18, height 5\' 1"  (1.549 m), weight 58.514 kg (129 lb), last menstrual period 02/16/2012. Maternal Exam:  Uterine Assessment: Contraction strength is moderate.  Contraction frequency is irregular.   Abdomen: Patient reports no abdominal tenderness. Estimated fetal weight is 6 lbs.   Fetal  presentation: vertex  Introitus: Normal vulva. Normal vagina.  Ferning test: positive.  Nitrazine test: positive. Amniotic fluid character: clear.  Pelvis: adequate for delivery.   Cervix: Cervix evaluated by digital exam.    VE- closed, 50/-2, vtx in the office Physical Exam  Constitutional: She appears well-developed and well-nourished.  Cardiovascular: Normal rate, regular rhythm and normal heart sounds.   No murmur heard. Respiratory: Effort normal and breath sounds normal. No respiratory distress. She has no wheezes.  GI: Soft.       Gravid     Prenatal labs: ABO, Rh:  B pos Antibody:  neg Rubella:  Imm RPR:   NR HBsAg:   Neg HIV:   Neg  GBS:   neg  Assessment/Plan: IUP at 36+ weeks with PPROM, cervix closed.  Will admit, start pitocin augmentation, monitor progress.   Kojo Liby D 11/01/2012, 1:19 PM

## 2012-11-02 MED ORDER — LIDOCAINE HCL (PF) 1 % IJ SOLN
INTRAMUSCULAR | Status: DC | PRN
  Administered 2012-11-02 (×2): 5 mL

## 2012-11-02 MED ORDER — BUTORPHANOL TARTRATE 1 MG/ML IJ SOLN
1.0000 mg | INTRAMUSCULAR | Status: DC | PRN
  Administered 2012-11-02 (×3): 1 mg via INTRAVENOUS
  Filled 2012-11-02 (×3): qty 1

## 2012-11-02 NOTE — Anesthesia Procedure Notes (Signed)
Epidural Patient location during procedure: OB Start time: 11/02/2012 5:14 PM  Staffing Anesthesiologist: Angus Seller., Harrell Gave. Performed by: anesthesiologist   Preanesthetic Checklist Completed: patient identified, site marked, surgical consent, pre-op evaluation, timeout performed, IV checked, risks and benefits discussed and monitors and equipment checked  Epidural Patient position: sitting Prep: site prepped and draped and DuraPrep Patient monitoring: continuous pulse ox and blood pressure Approach: midline Injection technique: LOR air and LOR saline  Needle:  Needle type: Tuohy  Needle gauge: 17 G Needle length: 9 cm and 9 Needle insertion depth: 4 cm Catheter type: closed end flexible Catheter size: 19 Gauge Catheter at skin depth: 10 cm Test dose: negative  Assessment Events: blood not aspirated, injection not painful, no injection resistance, negative IV test and no paresthesia  Additional Notes Patient identified.  Risk benefits discussed including failed block, incomplete pain control, headache, nerve damage, paralysis, blood pressure changes, nausea, vomiting, reactions to medication both toxic or allergic, and postpartum back pain.  Patient expressed understanding and wished to proceed.  All questions were answered.  Sterile technique used throughout procedure and epidural site dressed with sterile barrier dressing. No paresthesia or other complications noted.The patient did not experience any signs of intravascular injection such as tinnitus or metallic taste in mouth nor signs of intrathecal spread such as rapid motor block. Please see nursing notes for vital signs.

## 2012-11-02 NOTE — Progress Notes (Signed)
Feeling ctx Afeb, VSS FHT- Cat I, ctx q 1-2 min VE- closed/70/-2, vtx Continue pitocin, monitor progress

## 2012-11-02 NOTE — Progress Notes (Signed)
Patient ID: Angela Henson, female   DOB: 05/21/1991, 22 y.o.   MRN: [id number] The pitocin is at 8 mu/ minute. The pt has minimal pain with her contractions. The cervix is almost closed and the vertex is high. I spoke to Dr. Richardean Sale from MFM and he feels it is adviseable to insert a Foley into the lower uterine segment. That was done under sterile conditions and 60 cc's was placed in the balloon.

## 2012-11-02 NOTE — Progress Notes (Signed)
Patient ID: Angela Henson, female   DOB: 1990/11/18, 22 y.o.   MRN: [id number] Pt states she had SROM at 10AM yesterday. She has been on pitocin since admission but at present is on 5 mu/ minute and the cervix has remained closed, long and the vertex is at - 4 station. Her contractions are q 3 and 3/4 minutes apart. Will try to increase pitocin

## 2012-11-02 NOTE — Progress Notes (Signed)
Patient ID: Angela Henson, female   DOB: 1991-01-22, 22 y.o.   MRN: [id number] Pt has received an epidural and is on 9 mu/ minute of pitocin The contractions are q 2-3 minutes. The cervix is 1 cm dilated Because of the foley balloon I cannot feel the vertex and cannot determine effacement.

## 2012-11-02 NOTE — Anesthesia Preprocedure Evaluation (Addendum)
Anesthesia Evaluation  Patient identified by MRN, date of birth, ID band Patient awake    Reviewed: Allergy & Precautions, H&P , Patient's Chart, lab work & pertinent test results  Airway Mallampati: II TM Distance: >3 FB Neck ROM: full    Dental No notable dental hx.    Pulmonary neg pulmonary ROS,  breath sounds clear to auscultation  Pulmonary exam normal       Cardiovascular negative cardio ROS  Rhythm:regular Rate:Normal     Neuro/Psych negative neurological ROS  negative psych ROS   GI/Hepatic negative GI ROS, Neg liver ROS,   Endo/Other  negative endocrine ROS  Renal/GU negative Renal ROS     Musculoskeletal   Abdominal   Peds  Hematology negative hematology ROS (+)   Anesthesia Other Findings   Reproductive/Obstetrics (+) Pregnancy                           Anesthesia Physical Anesthesia Plan  ASA: II and emergent  Anesthesia Plan: Epidural   Post-op Pain Management:    Induction:   Airway Management Planned:   Additional Equipment:   Intra-op Plan:   Post-operative Plan:   Informed Consent: I have reviewed the patients History and Physical, chart, labs and discussed the procedure including the risks, benefits and alternatives for the proposed anesthesia with the patient or authorized representative who has indicated his/her understanding and acceptance.     Plan Discussed with: Anesthesiologist, Surgeon and CRNA  Anesthesia Plan Comments: (Patient for C/Section for failure to progress.)       Anesthesia Quick Evaluation

## 2012-11-02 NOTE — Progress Notes (Signed)
Patient ID: Angela Henson, female   DOB: 01/15/1991, 22 y.o.   MRN: [id number] The pitocin is at 14 mu/ minute and the contractions are regular The RN has examined the pt and states the cervix is dilated only the diameter of the catheter tubing. She was unable to determine the effacement because of the bulb.

## 2012-11-03 ENCOUNTER — Encounter (HOSPITAL_COMMUNITY): Payer: Self-pay | Admitting: Anesthesiology

## 2012-11-03 ENCOUNTER — Encounter (HOSPITAL_COMMUNITY)
Admission: AD | Disposition: A | Discharge status: Home / Self Care | Payer: Self-pay | Source: Ambulatory Visit | Attending: Obstetrics and Gynecology

## 2012-11-03 ENCOUNTER — Inpatient Hospital Stay (HOSPITAL_COMMUNITY): Payer: Medicaid Other | Admitting: Anesthesiology

## 2012-11-03 ENCOUNTER — Encounter (HOSPITAL_COMMUNITY): Payer: Self-pay | Admitting: *Deleted

## 2012-11-03 LAB — CBC WITH DIFFERENTIAL/PLATELET
Basophils Absolute: 0 10*3/uL (ref 0.0–0.1)
Basophils Absolute: 0 10*3/uL (ref 0.0–0.1)
Basophils Relative: 0 % (ref 0–1)
Basophils Relative: 0 % (ref 0–1)
Eosinophils Absolute: 0 10*3/uL (ref 0.0–0.7)
Hemoglobin: 9.7 g/dL — ABNORMAL LOW (ref 12.0–15.0)
Lymphocytes Relative: 14 % (ref 12–46)
Lymphs Abs: 2 10*3/uL (ref 0.7–4.0)
MCH: 26 pg (ref 26.0–34.0)
MCHC: 32 g/dL (ref 30.0–36.0)
MCHC: 31.8 g/dL (ref 30.0–36.0)
Monocytes Absolute: 1.4 10*3/uL — ABNORMAL HIGH (ref 0.1–1.0)
Neutro Abs: 11.7 10*3/uL — ABNORMAL HIGH (ref 1.7–7.7)
Neutrophils Relative %: 77 % (ref 43–77)
Neutrophils Relative %: 77 % (ref 43–77)
Platelets: 198 10*3/uL (ref 150–400)
RBC: 3.62 MIL/uL — ABNORMAL LOW (ref 3.87–5.11)
RBC: 3.76 MIL/uL — ABNORMAL LOW (ref 3.87–5.11)
RDW: 12.9 % (ref 11.5–15.5)
RDW: 12.9 % (ref 11.5–15.5)

## 2012-11-03 LAB — COMPREHENSIVE METABOLIC PANEL
ALT: 9 U/L (ref 0–35)
ALT: 9 U/L (ref 0–35)
AST: 19 U/L (ref 0–37)
Albumin: 2.1 g/dL — ABNORMAL LOW (ref 3.5–5.2)
Albumin: 2.2 g/dL — ABNORMAL LOW (ref 3.5–5.2)
Alkaline Phosphatase: 231 U/L — ABNORMAL HIGH (ref 39–117)
Alkaline Phosphatase: 251 U/L — ABNORMAL HIGH (ref 39–117)
CO2: 22 mEq/L (ref 19–32)
Calcium: 8.2 mg/dL — ABNORMAL LOW (ref 8.4–10.5)
Chloride: 103 mEq/L (ref 96–112)
Creatinine, Ser: 1.32 mg/dL — ABNORMAL HIGH (ref 0.50–1.10)
GFR calc non Af Amer: 58 mL/min — ABNORMAL LOW (ref >90)
Potassium: 4 mEq/L (ref 3.5–5.1)
Potassium: 4.3 mEq/L (ref 3.5–5.1)
Sodium: 137 mEq/L (ref 135–145)
Sodium: 135 mEq/L (ref 135–145)
Total Bilirubin: 0.4 mg/dL (ref 0.3–1.2)
Total Protein: 5.2 g/dL — ABNORMAL LOW (ref 6.0–8.3)

## 2012-11-03 LAB — MAGNESIUM
Magnesium: 2.3 mg/dL (ref 1.5–2.5)
Magnesium: 4.1 mg/dL — ABNORMAL HIGH (ref 1.5–2.5)

## 2012-11-03 LAB — PREPARE RBC (CROSSMATCH)

## 2012-11-03 LAB — URIC ACID: Uric Acid, Serum: 6.2 mg/dL (ref 2.4–7.0)

## 2012-11-03 SURGERY — Surgical Case
Anesthesia: Epidural | Site: Abdomen | Wound class: Clean Contaminated

## 2012-11-03 MED ORDER — TETANUS-DIPHTH-ACELL PERTUSSIS 5-2.5-18.5 LF-MCG/0.5 IM SUSP
0.5000 mL | Freq: Once | INTRAMUSCULAR | Status: DC
  Filled 2012-11-03: qty 0.5

## 2012-11-03 MED ORDER — DIPHENHYDRAMINE HCL 50 MG/ML IJ SOLN: 25.0000 mg | INTRAMUSCULAR | Status: DC | PRN

## 2012-11-03 MED ORDER — METOCLOPRAMIDE HCL 5 MG/ML IJ SOLN: 10.0000 mg | Freq: Three times a day (TID) | INTRAMUSCULAR | Status: DC | PRN

## 2012-11-03 MED ORDER — SIMETHICONE 80 MG PO CHEW: 80.0000 mg | CHEWABLE_TABLET | ORAL | Status: DC | PRN

## 2012-11-03 MED ORDER — ONDANSETRON HCL 4 MG/2ML IJ SOLN: 4.0000 mg | INTRAMUSCULAR | Status: DC | PRN

## 2012-11-03 MED ORDER — LANOLIN HYDROUS EX OINT: 1.0000 "application " | TOPICAL_OINTMENT | CUTANEOUS | Status: DC | PRN

## 2012-11-03 MED ORDER — CEFAZOLIN SODIUM-DEXTROSE 2-3 GM-% IV SOLR
INTRAVENOUS | Status: AC
  Filled 2012-11-03: qty 50

## 2012-11-03 MED ORDER — MEPERIDINE HCL 25 MG/ML IJ SOLN: 6.2500 mg | INTRAMUSCULAR | Status: DC | PRN

## 2012-11-03 MED ORDER — SODIUM BICARBONATE 8.4 % IV SOLN
INTRAVENOUS | Status: AC
  Filled 2012-11-03: qty 50

## 2012-11-03 MED ORDER — KETOROLAC TROMETHAMINE 30 MG/ML IJ SOLN
30.0000 mg | Freq: Four times a day (QID) | INTRAMUSCULAR | Status: AC | PRN
  Administered 2012-11-03: 30 mg via INTRAVENOUS

## 2012-11-03 MED ORDER — ZOLPIDEM TARTRATE 5 MG PO TABS: 5.0000 mg | ORAL_TABLET | Freq: Every evening | ORAL | Status: DC | PRN

## 2012-11-03 MED ORDER — MORPHINE SULFATE (PF) 0.5 MG/ML IJ SOLN
INTRAMUSCULAR | Status: DC | PRN
  Administered 2012-11-03: 4 mg via EPIDURAL

## 2012-11-03 MED ORDER — ONDANSETRON HCL 4 MG/2ML IJ SOLN
INTRAMUSCULAR | Status: DC | PRN
  Administered 2012-11-03: 4 mg via INTRAVENOUS

## 2012-11-03 MED ORDER — OXYTOCIN 10 UNIT/ML IJ SOLN
40.0000 [IU] | INTRAVENOUS | Status: DC | PRN
  Administered 2012-11-03: 40 [IU] via INTRAVENOUS

## 2012-11-03 MED ORDER — MEPERIDINE HCL 25 MG/ML IJ SOLN
INTRAMUSCULAR | Status: AC
  Filled 2012-11-03: qty 1

## 2012-11-03 MED ORDER — CHLOROPROCAINE HCL 3 % IJ SOLN
INTRAMUSCULAR | Status: AC
  Filled 2012-11-03: qty 20

## 2012-11-03 MED ORDER — IBUPROFEN 600 MG PO TABS: 600.0000 mg | ORAL_TABLET | Freq: Four times a day (QID) | ORAL | Status: DC | PRN

## 2012-11-03 MED ORDER — NALOXONE HCL 1 MG/ML IJ SOLN: 1.0000 ug/kg/h | INTRAVENOUS | Status: DC | PRN

## 2012-11-03 MED ORDER — SCOPOLAMINE 1 MG/3DAYS TD PT72
MEDICATED_PATCH | TRANSDERMAL | Status: AC
  Administered 2012-11-03: 1.5 mg via TRANSDERMAL
  Filled 2012-11-03: qty 1

## 2012-11-03 MED ORDER — MAGNESIUM SULFATE 40 MG/ML IJ SOLN: 2.0000 g | Freq: Once | INTRAMUSCULAR | Status: DC

## 2012-11-03 MED ORDER — FENTANYL CITRATE 0.05 MG/ML IJ SOLN
25.0000 ug | INTRAMUSCULAR | Status: DC | PRN
  Administered 2012-11-03: 25 ug via INTRAVENOUS
  Administered 2012-11-03: 50 ug via INTRAVENOUS
  Administered 2012-11-03: 25 ug via INTRAVENOUS

## 2012-11-03 MED ORDER — CITRIC ACID-SODIUM CITRATE 334-500 MG/5ML PO SOLN
ORAL | Status: AC
  Administered 2012-11-03: 30 mL
  Filled 2012-11-03: qty 15

## 2012-11-03 MED ORDER — FENTANYL CITRATE 0.05 MG/ML IJ SOLN
INTRAMUSCULAR | Status: AC
  Administered 2012-11-03: 25 ug via INTRAVENOUS
  Filled 2012-11-03: qty 2

## 2012-11-03 MED ORDER — DIPHENHYDRAMINE HCL 50 MG/ML IJ SOLN: 12.5000 mg | INTRAMUSCULAR | Status: DC | PRN

## 2012-11-03 MED ORDER — SENNOSIDES-DOCUSATE SODIUM 8.6-50 MG PO TABS
2.0000 | ORAL_TABLET | Freq: Every day | ORAL | Status: DC
  Administered 2012-11-03 – 2012-11-04 (×2): 2 via ORAL

## 2012-11-03 MED ORDER — MAGNESIUM SULFATE 40 G IN LACTATED RINGERS - SIMPLE
2.0000 g/h | INTRAVENOUS | Status: AC
  Administered 2012-11-03: 1 g/h via INTRAVENOUS
  Administered 2012-11-03 (×2): 2 g/h via INTRAVENOUS
  Filled 2012-11-03: qty 500

## 2012-11-03 MED ORDER — SODIUM CHLORIDE 0.9 % IJ SOLN: 3.0000 mL | INTRAMUSCULAR | Status: DC | PRN

## 2012-11-03 MED ORDER — MORPHINE SULFATE 0.5 MG/ML IJ SOLN
INTRAMUSCULAR | Status: AC
  Filled 2012-11-03: qty 10

## 2012-11-03 MED ORDER — CEFAZOLIN SODIUM 1-5 GM-% IV SOLN
1.0000 g | Freq: Three times a day (TID) | INTRAVENOUS | Status: AC
  Administered 2012-11-03 – 2012-11-04 (×2): 1 g via INTRAVENOUS
  Filled 2012-11-03 (×2): qty 50

## 2012-11-03 MED ORDER — MORPHINE SULFATE (PF) 0.5 MG/ML IJ SOLN
INTRAMUSCULAR | Status: DC | PRN
  Administered 2012-11-03: 1 mg via INTRAVENOUS

## 2012-11-03 MED ORDER — OXYTOCIN 10 UNIT/ML IJ SOLN
INTRAMUSCULAR | Status: AC
  Filled 2012-11-03: qty 4

## 2012-11-03 MED ORDER — SCOPOLAMINE 1 MG/3DAYS TD PT72
1.0000 | MEDICATED_PATCH | Freq: Once | TRANSDERMAL | Status: DC
  Administered 2012-11-03: 1.5 mg via TRANSDERMAL

## 2012-11-03 MED ORDER — IBUPROFEN 600 MG PO TABS
600.0000 mg | ORAL_TABLET | Freq: Four times a day (QID) | ORAL | Status: DC
  Administered 2012-11-03 – 2012-11-05 (×7): 600 mg via ORAL
  Filled 2012-11-03 (×6): qty 1

## 2012-11-03 MED ORDER — DIBUCAINE 1 % RE OINT: 1.0000 "application " | TOPICAL_OINTMENT | RECTAL | Status: DC | PRN

## 2012-11-03 MED ORDER — NALOXONE HCL 0.4 MG/ML IJ SOLN: 0.4000 mg | INTRAMUSCULAR | Status: DC | PRN

## 2012-11-03 MED ORDER — OXYTOCIN 40 UNITS IN LACTATED RINGERS INFUSION - SIMPLE MED
62.5000 mL/h | INTRAVENOUS | Status: DC
  Administered 2012-11-03: 62.5 mL/h via INTRAVENOUS
  Filled 2012-11-03: qty 1000

## 2012-11-03 MED ORDER — MENTHOL 3 MG MT LOZG: 1.0000 | LOZENGE | OROMUCOSAL | Status: DC | PRN

## 2012-11-03 MED ORDER — SODIUM CHLORIDE 0.9 % IR SOLN
Status: DC | PRN
  Administered 2012-11-03: 1000 mL

## 2012-11-03 MED ORDER — SIMETHICONE 80 MG PO CHEW
80.0000 mg | CHEWABLE_TABLET | Freq: Three times a day (TID) | ORAL | Status: DC
  Administered 2012-11-03 – 2012-11-05 (×6): 80 mg via ORAL

## 2012-11-03 MED ORDER — DIPHENHYDRAMINE HCL 25 MG PO CAPS: 25.0000 mg | ORAL_CAPSULE | ORAL | Status: DC | PRN

## 2012-11-03 MED ORDER — CEFAZOLIN SODIUM 1-5 GM-% IV SOLN
INTRAVENOUS | Status: DC | PRN
  Administered 2012-11-03: 2 g via INTRAVENOUS

## 2012-11-03 MED ORDER — MAGNESIUM SULFATE BOLUS VIA INFUSION
2.0000 g | Freq: Once | INTRAVENOUS | Status: DC
  Filled 2012-11-03: qty 500

## 2012-11-03 MED ORDER — DIPHENHYDRAMINE HCL 25 MG PO CAPS: 25.0000 mg | ORAL_CAPSULE | Freq: Four times a day (QID) | ORAL | Status: DC | PRN

## 2012-11-03 MED ORDER — FENTANYL CITRATE 0.05 MG/ML IJ SOLN
INTRAMUSCULAR | Status: AC
  Filled 2012-11-03: qty 2

## 2012-11-03 MED ORDER — LIDOCAINE-EPINEPHRINE (PF) 2 %-1:200000 IJ SOLN
INTRAMUSCULAR | Status: AC
  Filled 2012-11-03: qty 20

## 2012-11-03 MED ORDER — WITCH HAZEL-GLYCERIN EX PADS: 1.0000 "application " | MEDICATED_PAD | CUTANEOUS | Status: DC | PRN

## 2012-11-03 MED ORDER — ONDANSETRON HCL 4 MG/2ML IJ SOLN: 4.0000 mg | Freq: Three times a day (TID) | INTRAMUSCULAR | Status: DC | PRN

## 2012-11-03 MED ORDER — ONDANSETRON HCL 4 MG PO TABS: 4.0000 mg | ORAL_TABLET | ORAL | Status: DC | PRN

## 2012-11-03 MED ORDER — NALBUPHINE HCL 10 MG/ML IJ SOLN: 5.0000 mg | INTRAMUSCULAR | Status: DC | PRN

## 2012-11-03 MED ORDER — LACTATED RINGERS IV SOLN
INTRAVENOUS | Status: AC
  Administered 2012-11-04: 06:00:00 via INTRAVENOUS

## 2012-11-03 MED ORDER — KETOROLAC TROMETHAMINE 30 MG/ML IJ SOLN
INTRAMUSCULAR | Status: AC
  Administered 2012-11-03: 30 mg via INTRAVENOUS
  Filled 2012-11-03: qty 1

## 2012-11-03 MED ORDER — FENTANYL CITRATE 0.05 MG/ML IJ SOLN
INTRAMUSCULAR | Status: DC | PRN
  Administered 2012-11-03 (×2): 50 ug via INTRAVENOUS

## 2012-11-03 MED ORDER — CEFAZOLIN SODIUM-DEXTROSE 2-3 GM-% IV SOLR: 2.0000 g | Freq: Once | INTRAVENOUS | Status: DC

## 2012-11-03 MED ORDER — SODIUM BICARBONATE 8.4 % IV SOLN
INTRAVENOUS | Status: DC | PRN
  Administered 2012-11-03: 5 mL via EPIDURAL

## 2012-11-03 MED ORDER — KETOROLAC TROMETHAMINE 30 MG/ML IJ SOLN: 30.0000 mg | Freq: Four times a day (QID) | INTRAMUSCULAR | Status: AC | PRN

## 2012-11-03 MED ORDER — OXYTOCIN 40 UNITS IN LACTATED RINGERS INFUSION - SIMPLE MED: 1.0000 m[IU]/min | INTRAVENOUS | Status: DC

## 2012-11-03 MED ORDER — MEASLES, MUMPS & RUBELLA VAC ~~LOC~~ INJ: 0.5000 mL | INJECTION | Freq: Once | SUBCUTANEOUS | Status: DC

## 2012-11-03 MED ORDER — LACTATED RINGERS IV SOLN
INTRAVENOUS | Status: DC | PRN
  Administered 2012-11-03 (×2): via INTRAVENOUS

## 2012-11-03 MED ORDER — ONDANSETRON HCL 4 MG/2ML IJ SOLN
INTRAMUSCULAR | Status: AC
  Filled 2012-11-03: qty 2

## 2012-11-03 MED ORDER — NALBUPHINE HCL 10 MG/ML IJ SOLN
5.0000 mg | INTRAMUSCULAR | Status: DC | PRN
  Administered 2012-11-03: 5 mg via INTRAVENOUS
  Filled 2012-11-03: qty 1

## 2012-11-03 MED ORDER — MEPERIDINE HCL 25 MG/ML IJ SOLN
INTRAMUSCULAR | Status: DC | PRN
  Administered 2012-11-03 (×2): 12.5 mg via INTRAVENOUS

## 2012-11-03 MED ORDER — OXYCODONE-ACETAMINOPHEN 5-325 MG PO TABS
1.0000 | ORAL_TABLET | ORAL | Status: DC | PRN
Start: 2012-11-03 — End: 2012-11-05
  Administered 2012-11-04: 1 via ORAL
  Administered 2012-11-04: 2 via ORAL
  Administered 2012-11-04 (×2): 1 via ORAL
  Administered 2012-11-04: 2 via ORAL
  Administered 2012-11-05 (×3): 1 via ORAL
  Filled 2012-11-03 (×2): qty 2
  Filled 2012-11-03 (×2): qty 1
  Filled 2012-11-03: qty 2
  Filled 2012-11-03 (×3): qty 1

## 2012-11-03 SURGICAL SUPPLY — 38 items
CLOTH BEACON ORANGE TIMEOUT ST (SAFETY) ×2 IMPLANT
CONTAINER PREFILL 10% NBF 15ML (MISCELLANEOUS) IMPLANT
DRAPE LG THREE QUARTER DISP (DRAPES) ×2 IMPLANT
DRESSING TELFA 8X3 (GAUZE/BANDAGES/DRESSINGS) IMPLANT
DRSG OPSITE POSTOP 4X10 (GAUZE/BANDAGES/DRESSINGS) ×2 IMPLANT
DRSG VASELINE 3X18 (GAUZE/BANDAGES/DRESSINGS) ×2 IMPLANT
DURAPREP 26ML APPLICATOR (WOUND CARE) ×2 IMPLANT
ELECT REM PT RETURN 9FT ADLT (ELECTROSURGICAL) ×2
ELECTRODE REM PT RTRN 9FT ADLT (ELECTROSURGICAL) ×1 IMPLANT
EXTRACTOR VACUUM KIWI (MISCELLANEOUS) IMPLANT
EXTRACTOR VACUUM M CUP 4 TUBE (SUCTIONS) IMPLANT
GAUZE SPONGE 4X4 12PLY STRL LF (GAUZE/BANDAGES/DRESSINGS) ×2 IMPLANT
GAUZE VASELINE 3X9 (GAUZE/BANDAGES/DRESSINGS) ×1 IMPLANT
GLOVE BIO SURGEON STRL SZ7.5 (GLOVE) ×4 IMPLANT
GLOVE SKINSENSE NS SZ6.5 (GLOVE) ×3
GLOVE SKINSENSE STRL SZ6.5 (GLOVE) IMPLANT
GOWN PREVENTION PLUS LG XLONG (DISPOSABLE) ×4 IMPLANT
GOWN PREVENTION PLUS XLARGE (GOWN DISPOSABLE) ×2 IMPLANT
KIT ABG SYR 3ML LUER SLIP (SYRINGE) IMPLANT
NDL HYPO 25X5/8 SAFETYGLIDE (NEEDLE) IMPLANT
NEEDLE HYPO 25X5/8 SAFETYGLIDE (NEEDLE) IMPLANT
NS IRRIG 1000ML POUR BTL (IV SOLUTION) ×2 IMPLANT
PACK C SECTION WH (CUSTOM PROCEDURE TRAY) ×2 IMPLANT
PAD ABD 7.5X8 STRL (GAUZE/BANDAGES/DRESSINGS) IMPLANT
PAD OB MATERNITY 4.3X12.25 (PERSONAL CARE ITEMS) ×2 IMPLANT
RTRCTR C-SECT PINK 25CM LRG (MISCELLANEOUS) IMPLANT
SLEEVE SCD COMPRESS KNEE MED (MISCELLANEOUS) IMPLANT
STAPLER VISISTAT 35W (STAPLE) IMPLANT
SUT PLAIN 0 NONE (SUTURE) IMPLANT
SUT VIC AB 0 CT1 36 (SUTURE) ×15 IMPLANT
SUT VIC AB 3-0 CTX 36 (SUTURE) ×2 IMPLANT
SUT VIC AB 3-0 SH 27 (SUTURE)
SUT VIC AB 3-0 SH 27X BRD (SUTURE) IMPLANT
SUT VIC AB 4-0 KS 27 (SUTURE) IMPLANT
SUT VICRYL 0 TIES 12 18 (SUTURE) IMPLANT
TOWEL OR 17X24 6PK STRL BLUE (TOWEL DISPOSABLE) ×6 IMPLANT
TRAY FOLEY CATH 14FR (SET/KITS/TRAYS/PACK) ×1 IMPLANT
WATER STERILE IRR 1000ML POUR (IV SOLUTION) ×2 IMPLANT

## 2012-11-03 NOTE — Progress Notes (Signed)
Patient ID: Angela Henson, female   DOB: 01-19-1991, 22 y.o.   MRN: [id number] Pt declines restarting pitocin She wants a c section. The cervix is unchanged.

## 2012-11-03 NOTE — Progress Notes (Signed)
Patient ID: Angela Henson, female   DOB: 04-Mar-1991, 22 y.o.   MRN: [id number] I was called to see the pt because of tenderness in her abdomen and markedly decreased variability of the FHR. I had spoken to Dr. Vincenza Hews and she had advised stopping the pitocin for 2 hours and repeating her labs to check on the creatinine. She felt if the creatinine was the same or higher she should be given Magnesium sulfate. I had asked her if there was a time limit on how long I should try to effect vaginal delivery and she stated there was no absolute rule. The cervix is unchanged

## 2012-11-03 NOTE — Progress Notes (Signed)
Patient ID: Angela Henson, female   DOB: 02-Aug-1991, 22 y.o.   MRN: [id number] Contractions are q 2+ minutes. The cervix is 2 cm 80 % effaced and the vertex is at 0 station.

## 2012-11-03 NOTE — Progress Notes (Signed)
Patient ID: Angela Henson, female   DOB: [birth year], 22 y.o.   MRN: [id number] Contractions continue to occur too frequently so will cut pitocin back again. Per the RN she can get 1 FT in the cervix beside the catheter. She feels the cervix is 90% effaced. The pt is afebrile but the BP is 144/91. Will check PIH labs

## 2012-11-03 NOTE — Op Note (Signed)
Angela Henson, Angela Henson                 ACCOUNT NO.:  [id number]  MEDICAL RECORD NO.:  [id number]  LOCATION:  9373                          FACILITY:  [facility]  PHYSICIAN:  Malachi Pro. Ambrose Mantle, M.D. DATE OF BIRTH:  Jun 09, 1991  DATE OF PROCEDURE:  11/03/2012 DATE OF DISCHARGE:                              OPERATIVE REPORT   PREOPERATIVE DIAGNOSIS:  Intrauterine pregnancy, 36 weeks and 3 days, preterm premature rupture of the membranes, failed induction of labor, probable preeclampsia.  POSTOPERATIVE DIAGNOSIS:  Intrauterine pregnancy, 36 weeks and 3 days, preterm premature rupture of the membranes, failed induction of labor, probable preeclampsia.  OPERATION:  Low-transverse cervical C-section.  SURGEON:  Malachi Pro. Ambrose Mantle, M.D.  ANESTHESIA:  Epidural anesthesia, Dr. Malen Gauze.  PROCEDURE IN DETAIL:  The patient was brought to the operating room and the epidural anesthetic was boosted.  She had a Foley catheter indwelling.  The abdomen was prepped with a DuraPrep and draped as a sterile field.  A time-out was done.  After anesthesia was confirmed, I made a transverse incision through the skin, subcutaneous tissue, and fascia 2 fingerbreadths suprapubically.  Separated the fascia from the rectus muscles superiorly and inferiorly, split the rectus muscle in the midline, opened the peritoneum vertically, exposed the lower uterine segment with an Teacher, early years/pre.  Made a short transverse incision through the superficial layers of the myometrium in the lower uterine segment, went into the amniotic sac with my finger, enlarged the incision by pulling superiorly and inferiorly.  Delivered the vertex into the incisional opening.  Suction the nose and mouth with a bulb and then delivered the rest of the baby, clamped the cord and gave the infant to Dr. Ruben Gottron, who was in attendance.  He assigned the infant Apgars of 8 and 9 at 1 and 5 minutes.  There was vigorous bleeder on the lower edge of the  incision.  The placenta was removed intact. The inside of the uterus was inspected and found to be free of debris. The uterine incision was then closed in 2 layers using a running lock suture of 0 Vicryl and the 1st layer nonlocking suture of the same material on the second layer.  The gutters were blotted free of blood. Both tubes and ovaries and the uterus appeared normal, although the uterus had been somewhat boggy just after delivery and I used manual massage along with Pitocin to help restore the tone.  The gutters were blotted free of blood, liberal irrigation confirmed hemostasis.  The Alexis retractor was removed and the abdominal wall was closed in layers using interrupted sutures of 0 Vicryl to close the rectus muscle and peritoneum in 1 layer, 2 running sutures of 0 Vicryl on the fascia, running 3-0 Vicryl on the subcutaneous tissue, and staples on the skin.  The patient seemed to tolerate the procedure well. Blood loss is about a 1000 mL.  Sponge and needle counts were correct. She returned to recovery in satisfactory condition.     Malachi Pro. Ambrose Mantle, M.D.     TFH/MEDQ  D:  11/03/2012  T:  11/03/2012  Job:  [id number]

## 2012-11-03 NOTE — Anesthesia Postprocedure Evaluation (Signed)
  Anesthesia Post-op Note  Patient: Angela Henson  Procedure(s) Performed: Procedure(s) (LRB) with comments: CESAREAN SECTION (N/A) - Primary cesarean section with delivery of baby boy at 35. Apgars 8/9.  Patient Location: A-ICU  Anesthesia Type:Epidural  Level of Consciousness: awake, alert  and oriented  Airway and Oxygen Therapy: Patient Spontanous Breathing  Post-op Pain: 4 /10  Post-op Assessment: Post-op Vital signs reviewed, Patient's Cardiovascular Status Stable, No headache, No backache, No residual numbness and No residual motor weakness  Post-op Vital Signs: Reviewed and stable  Complications: No apparent anesthesia complications

## 2012-11-03 NOTE — OR Nursing (Addendum)
Uterus massaged by S. Nuria Phebus Charity fundraiser. Two tubes of cord blood sent to lab. Foley catheter in place upon arrival to O.R. Urine color-yellow.  5cc of cord  blood evacuated from uterus during uterine massage.

## 2012-11-03 NOTE — Transfer of Care (Signed)
Immediate Anesthesia Transfer of Care Note  Patient: Angela Henson  Procedure(s) Performed: Procedure(s) (LRB) with comments: CESAREAN SECTION (N/A) - Primary cesarean section with delivery of baby boy at 80. Apgars 8/9.  Patient Location: PACU  Anesthesia Type:Epidural  Level of Consciousness: awake, alert  and oriented  Airway & Oxygen Therapy: Patient Spontanous Breathing  Post-op Assessment: Report given to PACU RN and Post -op Vital signs reviewed and stable  Post vital signs: Reviewed and stable  Complications: No apparent anesthesia complications

## 2012-11-03 NOTE — Addendum Note (Signed)
Addendum  created 11/03/12 1932 by Graciela Husbands, CRNA   Modules edited:Notes Section

## 2012-11-03 NOTE — Progress Notes (Signed)
Patient ID: JOHNNI WUNSCHEL, female   DOB: 02/27/1991, 22 y.o.   MRN: [id number] Contractions are q 1-3 minutes. The foley catheter was removed and the cervix is 2 cm 90% effaced and there seemed to be an intact sac in front of the baby's head. AROM produced clear fluid, a large amount. Will observe for progress now that the forebag has been ruptured. The labs showed normal PIH results but her creatinine had risen so will repeat later.

## 2012-11-03 NOTE — Anesthesia Postprocedure Evaluation (Signed)
  Anesthesia Post-op Note  Patient: Angela Henson  Procedure(s) Performed: Procedure(s) (LRB) with comments: CESAREAN SECTION (N/A) - Primary cesarean section with delivery of baby boy at 33. Apgars 8/9.  Patient Location: PACU  Anesthesia Type:Epidural  Level of Consciousness: awake, alert  and oriented  Airway and Oxygen Therapy: Patient Spontanous Breathing  Post-op Pain: mild  Post-op Assessment: Post-op Vital signs reviewed, Patient's Cardiovascular Status Stable, Respiratory Function Stable, Patent Airway, No signs of Nausea or vomiting, Pain level controlled, No headache, No backache, No residual numbness and No residual motor weakness  Post-op Vital Signs: Reviewed and stable  Complications: No apparent anesthesia complications

## 2012-11-03 NOTE — Progress Notes (Signed)
Patient ID: Angela Henson, female   DOB: Oct 04, 1991, 22 y.o.   MRN: [id number] Creatinine has risen so will begin Mag SO4 I will consult with nephrology

## 2012-11-04 LAB — CBC
Platelets: 189 10*3/uL (ref 150–400)
RBC: 3.05 MIL/uL — ABNORMAL LOW (ref 3.87–5.11)
RDW: 12.8 % (ref 11.5–15.5)
WBC: 18.2 10*3/uL — ABNORMAL HIGH (ref 4.0–10.5)

## 2012-11-04 LAB — MAGNESIUM: Magnesium: 6.5 mg/dL (ref 1.5–2.5)

## 2012-11-04 LAB — URIC ACID: Uric Acid, Serum: 6.5 mg/dL (ref 2.4–7.0)

## 2012-11-04 LAB — COMPREHENSIVE METABOLIC PANEL
AST: 23 U/L (ref 0–37)
Albumin: 1.7 g/dL — ABNORMAL LOW (ref 3.5–5.2)
Alkaline Phosphatase: 182 U/L — ABNORMAL HIGH (ref 39–117)
BUN: 9 mg/dL (ref 6–23)
Chloride: 103 mEq/L (ref 96–112)
Potassium: 4.2 mEq/L (ref 3.5–5.1)
Sodium: 135 mEq/L (ref 135–145)
Total Bilirubin: 0.2 mg/dL — ABNORMAL LOW (ref 0.3–1.2)
Total Protein: 5 g/dL — ABNORMAL LOW (ref 6.0–8.3)

## 2012-11-04 MED ORDER — SODIUM CHLORIDE 0.9 % IJ SOLN
3.0000 mL | Freq: Two times a day (BID) | INTRAMUSCULAR | Status: DC
  Administered 2012-11-04 (×2): 3 mL via INTRAVENOUS

## 2012-11-04 NOTE — Progress Notes (Signed)
Patient ID: Angela Henson, female   DOB: [birth year], 22 y.o.   MRN: [id number] #1 afebrile BP normal Output good. Weight 132. All PIH labs OK and creatinine down slightly. Mg level in range. Will d/c Mag at [facility] Pt is tolerating a regular diet and passing flatus. Has not been ambulated yet.

## 2012-11-05 ENCOUNTER — Encounter (HOSPITAL_COMMUNITY): Payer: Self-pay | Admitting: Obstetrics and Gynecology

## 2012-11-05 LAB — BASIC METABOLIC PANEL
CO2: 26 mEq/L (ref 19–32)
Chloride: 105 mEq/L (ref 96–112)
Creatinine, Ser: 1.02 mg/dL (ref 0.50–1.10)
Glucose, Bld: 93 mg/dL (ref 70–99)

## 2012-11-05 LAB — TYPE AND SCREEN

## 2012-11-05 MED ORDER — CHLOROPROCAINE HCL 1 % IJ SOLN
INTRAMUSCULAR | Status: AC
  Filled 2012-11-05: qty 30

## 2012-11-05 MED ORDER — OXYCODONE-ACETAMINOPHEN 5-325 MG PO TABS: 1.0000 | ORAL_TABLET | ORAL | Status: DC | PRN

## 2012-11-05 MED ORDER — FERROUS SULFATE 300 (60 FE) MG PO TABS: 300.0000 mg | ORAL_TABLET | Freq: Two times a day (BID) | ORAL | Status: DC

## 2012-11-05 NOTE — Progress Notes (Signed)
Patient ID: Angela Henson, female   DOB: [birth year], 22 y.o.   MRN: [id number] #2 afebrile BP acceptable Creatinine improved EGF rate > 90 For d/c

## 2012-11-05 NOTE — Progress Notes (Signed)
Ur chart review completed.  

## 2012-11-05 NOTE — Clinical Social Work Maternal (Signed)
    Clinical Social Work Department PSYCHOSOCIAL ASSESSMENT - MATERNAL/CHILD 11/05/2012  Patient:  CAMELA, WICH  Account Number:  [id number]  Admit Date:  11/01/2012  Marjo Bicker Name:   Hope Pigeon    Clinical Social Worker:  Lulu Riding, LCSW   Date/Time:  11/05/2012 10:00 AM  Date Referred:  11/05/2012   Referral source  CN     Referred reason  Substance Abuse   Other referral source:    I:  FAMILY / HOME ENVIRONMENT Child's legal guardian:  PARENT  Guardian - Name Guardian - Age Guardian - Address  Harryette Shuart [address], [place], [place] [address]  Estevan Ryder     Other household support members/support persons Name Relationship DOB  Baker Janus MOTHER    Other support:   [name] reports good supports    II  PSYCHOSOCIAL DATA Information Source:  Family Interview  Surveyor, quantity and [employer] Employment:   Surveyor, quantity resources:  [employer] If Medicaid - County:  [employer] / Grade:   Maternity Care Coordinator / Child Services Coordination / Early Interventions:  Cultural issues impacting care:   None identified    III  STRENGTHS Strengths  Adequate Resources  Compliance with medical plan  Home prepared for Child (including basic supplies)  Supportive family/friends   Strength comment:    IV  RISK FACTORS AND CURRENT PROBLEMS Current Problem:  YES   Risk Factor & Current Problem Patient Issue Family Issue Risk Factor / Current Problem Comment  Substance Abuse [place] [name]-hx of marijuana    V  SOCIAL WORK ASSESSMENT CSW met with [name] in her first floor room/107 to complete assessment for hx of marijuana use, but [name] had numerous family members present so CSW offered to return at a later time.  [name] stated CSW could stay and two family members stepped out.  MGM stayed and [name] stated we could talk about anything with her present.  CSW asked how she is feeling since she spent some time in AICU.  She states she is feeling much better and reports baby  is doing well.  CSW asked about [name]'s hx of marijuana use.  She states this is was in the past.  CSW asked if she could remember her last use and she said she last used at approximately 3-4 months pregnant.  CSW explained hospital drug screen policy and [name] and MGM were very understanding.  They report having everything they need at home and has no questions or needs at this time.  Baby's UDS is negative.      VI SOCIAL WORK PLAN Social Work Plan  No Further Intervention Required / No Barriers to Discharge   Type of pt/family education:   Hospital drug screen policy   If child protective services report - county:   If child protective services report - date:   Information/referral to community resources comment:   no needs identified at this time.   Other social work plan:   CSW will monitor meconium

## 2012-11-06 NOTE — Discharge Summary (Signed)
Angela Henson, Angela Henson                 ACCOUNT NO.:  [id number]  MEDICAL RECORD NO.:  [id number]  LOCATION:  9107                          FACILITY:  [facility]  PHYSICIAN:  Malachi Pro. Ambrose Mantle, M.D. DATE OF BIRTH:  29-Dec-1990  DATE OF ADMISSION:  11/01/2012 DATE OF DISCHARGE:  11/05/2012                              DISCHARGE SUMMARY   HISTORY OF PRESENT ILLNESS:  This is a 22 year old black female, para 0, gravida 1, EDC November 28, 2012, presented to our office with preterm premature rupture of the membranes.  The patient was leaking fluid on the morning of November 01, 2012, came to our office, was seen by Dr. Jackelyn Knife, he confirmed rupture of membranes.  He admitted the patient to the hospital for induction of labor.  Blood group and type B positive, negative antibody, rubella immune, RPR nonreactive, hepatitis B surface antigen negative, HIV negative, group B strep negative.  On admission, her cervix was closed.  She was placed on Pitocin.  On the morning after admission, her cervix was still closed, vertex is at a -4 station.  Initially, we tried to increase the Pitocin.  At about 1:12 p.m., the cervix had not opened any.  I spoke to Dr. Otho Perl from Maternal Fetal Medicine.  He said it was advisable to go ahead and insert a Foley into the lower uterine segment which was done at 1:12 p.m.  The patient then began to complain of a lot of pain, so she was given an epidural and by 6:10 p.m., her contractions every 2-3 minutes, but the patient's cervix remained no more than a cm dilated.  By 3 a.m. on November 02, 2012, the nurse felt she could get a finger tip beside the catheter.  With the patient's blood pressure had risen to 144/91, we did PIH labs and the [facility] labs were normal, but the creatinine had doubled since admission.  By 7:23 am, I was able to take the Foley out and rupture her membranes with clear fluid.  By 9:23 a.m., the cervix remained 2 cm, 80%, vertex at a 0 station.  At 10:54  a.m., I came to see the patient at the nurse's request because of decreased variability and tenderness in her abdomen.  We had stopped the Pitocin at Dr. Estanislado Pandy suggestion, aiming to restart it after a couple of hours.  At her suggestion, we repeated the creatinine.  It had increased a little more, so she advised giving magnesium sulfate.  The patient then decided that she had had enough and was ready for C-section.  She underwent a low- transverse cervical C-section under epidural anesthesia with delivery of a 6-pound 1-ounce infant with Apgars of 8 and 9 in 1 and 5 minutes.  She was kept in the ICU for 24 hours on magnesium sulfate.  The magnesium sulfate was then discontinued.  Her blood pressures have remained acceptable.  Her creatinine has continued to come down slightly, still 1.02, but estimated glomerular filtration rate is greater than 90.  On the second postpartum day, the patient was ready for discharge.  LABORATORY DATA:  Initial hemoglobin was 9+ and postpartum was 7.9.  A couple of PIH labs were normal.  The creatinine even though is not in the patient's chart, began at 0.57, rose to a high of 1.32, came down to 1.18 and 1.02.  FINAL DIAGNOSES: 1. Intrauterine pregnancy at 36+ weeks with preterm premature rupture     of the membranes. 2. Failed induction of labor. 3. Preeclampsia. 4. Mild renal dysfunction.  OPERATION:  Low-transverse cervical cesarean section.  FINAL CONDITION:  Improved.  DISCHARGE INSTRUCTIONS:  Instructions include our regular discharge instruction booklet and after visit summary, and Percocet 5/325, 30 tablets, 1 every 4-6 hours as needed for pain.  The patient is also advised to take ferrous sulfate 325 mg twice daily.  She is to return within 1 week to have her staples removed.     Malachi Pro. Ambrose Mantle, M.D.     TFH/MEDQ  D:  11/05/2012  T:  11/06/2012  Job:  [id number]

## 2013-01-31 ENCOUNTER — Emergency Department (HOSPITAL_COMMUNITY)
Admission: EM | Admit: 2013-01-31 | Discharge: 2013-01-31 | Disposition: A | Discharge status: Home / Self Care | Payer: Medicaid Other | Attending: Emergency Medicine | Admitting: Emergency Medicine

## 2013-01-31 ENCOUNTER — Encounter (HOSPITAL_COMMUNITY): Payer: Self-pay

## 2013-01-31 DIAGNOSIS — N10 Acute tubulo-interstitial nephritis: Secondary | ICD-10-CM

## 2013-01-31 DIAGNOSIS — Z87891 Personal history of nicotine dependence: Secondary | ICD-10-CM | POA: Insufficient documentation

## 2013-01-31 DIAGNOSIS — Z3202 Encounter for pregnancy test, result negative: Secondary | ICD-10-CM | POA: Insufficient documentation

## 2013-01-31 DIAGNOSIS — R3915 Urgency of urination: Secondary | ICD-10-CM | POA: Insufficient documentation

## 2013-01-31 DIAGNOSIS — Z8679 Personal history of other diseases of the circulatory system: Secondary | ICD-10-CM | POA: Insufficient documentation

## 2013-01-31 DIAGNOSIS — M549 Dorsalgia, unspecified: Secondary | ICD-10-CM | POA: Insufficient documentation

## 2013-01-31 DIAGNOSIS — R509 Fever, unspecified: Secondary | ICD-10-CM | POA: Insufficient documentation

## 2013-01-31 DIAGNOSIS — N12 Tubulo-interstitial nephritis, not specified as acute or chronic: Secondary | ICD-10-CM | POA: Insufficient documentation

## 2013-01-31 LAB — POCT I-STAT, CHEM 8
BUN: 15 mg/dL (ref 6–23)
Chloride: 104 mEq/L (ref 96–112)
Potassium: 3.7 mEq/L (ref 3.5–5.1)
Sodium: 140 mEq/L (ref 135–145)

## 2013-01-31 LAB — URINE MICROSCOPIC-ADD ON

## 2013-01-31 LAB — URINALYSIS, ROUTINE W REFLEX MICROSCOPIC
Glucose, UA: NEGATIVE mg/dL
Ketones, ur: 15 mg/dL — AB
pH: 7 (ref 5.0–8.0)

## 2013-01-31 LAB — CBC WITH DIFFERENTIAL/PLATELET
Basophils Relative: 0 % (ref 0–1)
HCT: 33.2 % — ABNORMAL LOW (ref 36.0–46.0)
Hemoglobin: 10.9 g/dL — ABNORMAL LOW (ref 12.0–15.0)
Lymphocytes Relative: 15 % (ref 12–46)
MCV: 70 fL — ABNORMAL LOW (ref 78.0–100.0)
Monocytes Relative: 8 % (ref 3–12)
Neutro Abs: 10.8 10*3/uL — ABNORMAL HIGH (ref 1.7–7.7)
WBC: 14 10*3/uL — ABNORMAL HIGH (ref 4.0–10.5)

## 2013-01-31 LAB — POCT PREGNANCY, URINE: Preg Test, Ur: NEGATIVE

## 2013-01-31 MED ORDER — ONDANSETRON HCL 8 MG PO TABS: 8.0000 mg | ORAL_TABLET | Freq: Three times a day (TID) | ORAL | Status: DC | PRN

## 2013-01-31 MED ORDER — CEPHALEXIN 500 MG PO CAPS: 500.0000 mg | ORAL_CAPSULE | Freq: Four times a day (QID) | ORAL | Status: DC

## 2013-01-31 MED ORDER — DEXTROSE 5 % IV SOLN
1.0000 g | INTRAVENOUS | Status: DC
  Administered 2013-01-31: 1 g via INTRAVENOUS
  Filled 2013-01-31: qty 10

## 2013-01-31 MED ORDER — HYDROCODONE-ACETAMINOPHEN 5-325 MG PO TABS: 1.0000 | ORAL_TABLET | Freq: Four times a day (QID) | ORAL | Status: DC | PRN

## 2013-01-31 MED ORDER — SODIUM CHLORIDE 0.9 % IV BOLUS (SEPSIS)
1000.0000 mL | Freq: Once | INTRAVENOUS | Status: AC
  Administered 2013-01-31: 1000 mL via INTRAVENOUS

## 2013-01-31 MED ORDER — ACETAMINOPHEN 325 MG PO TABS
650.0000 mg | ORAL_TABLET | Freq: Once | ORAL | Status: AC
  Administered 2013-01-31: 650 mg via ORAL
  Filled 2013-01-31: qty 2

## 2013-01-31 MED ORDER — IBUPROFEN 200 MG PO TABS
400.0000 mg | ORAL_TABLET | Freq: Once | ORAL | Status: AC
  Administered 2013-01-31: 400 mg via ORAL
  Filled 2013-01-31: qty 2

## 2013-01-31 MED ORDER — ONDANSETRON HCL 4 MG/2ML IJ SOLN
4.0000 mg | Freq: Once | INTRAMUSCULAR | Status: AC
  Administered 2013-01-31: 4 mg via INTRAVENOUS
  Filled 2013-01-31: qty 2

## 2013-01-31 MED ORDER — MORPHINE SULFATE 4 MG/ML IJ SOLN
4.0000 mg | Freq: Once | INTRAMUSCULAR | Status: AC
  Administered 2013-01-31: 4 mg via INTRAVENOUS
  Filled 2013-01-31: qty 1

## 2013-01-31 NOTE — ED Notes (Signed)
Discharge instructions reviewed. Pt verbalized understanding.  

## 2013-01-31 NOTE — ED Notes (Signed)
Pt requested apple juice w/ ice

## 2013-01-31 NOTE — ED Notes (Signed)
Pt ambulated to restroom without difficulty

## 2013-01-31 NOTE — ED Provider Notes (Signed)
History     CSN: [id number]  Arrival date & time 01/31/13  1317   First MD Initiated Contact with Patient 01/31/13 1639      Chief Complaint  Patient presents with  . Flank Pain    (Consider location/radiation/quality/duration/timing/severity/associated sxs/prior treatment) Patient is a 22 y.o. female presenting with flank pain. The history is provided by the patient.  Flank Pain Pertinent negatives include no chest pain, no abdominal pain, no headaches and no shortness of breath.  pt c/o urinary urgency, freq onset a week ago, developed right flank pain since, constant, dull, moderate, non radiating, worse w palpation. No hx same. Remote hx uncomplicated uti. On depo, no regular periods. No current vaginal discharge or bleeding. No anterior/abd pain. No nv. Fever in ed. No chills or sweats. No back trauma or fall. No hx kidney stones.     Past Medical History  Diagnosis Date  . No pertinent past medical history   . Headache     Past Surgical History  Procedure Laterality Date  . Wisdom tooth extraction    . Incision and drainage abscess anal  2013    I&D of right buttock  . Cesarean section  11/03/2012    Procedure: CESAREAN SECTION;  Surgeon: Bing Plume, MD;  Location: [facility];  Service: Obstetrics;  Laterality: N/A;  Primary cesarean section with delivery of baby boy at 37. Apgars 8/9.    Family History  Problem Relation Age of Onset  . Hypotension Neg Hx     History  Substance Use Topics  . Smoking status: Former Smoker -- 0.50 packs/day    Types: Cigarettes  . Smokeless tobacco: Not on file  . Alcohol Use: No    OB History   Grav Para Term Preterm Abortions TAB SAB Ect Mult Living   1 1 0 1 0 0 0 0 0 1       Review of Systems  Constitutional: Positive for fever. Negative for chills.  HENT: Negative for neck pain.   Eyes: Negative for redness.  Respiratory: Negative for shortness of breath.   Cardiovascular: Negative for chest pain.   Gastrointestinal: Negative for vomiting and abdominal pain.  Genitourinary: Positive for urgency and flank pain. Negative for vaginal bleeding and vaginal discharge.  Musculoskeletal: Positive for back pain.  Skin: Negative for rash.  Neurological: Negative for headaches.  Hematological: Does not bruise/bleed easily.  Psychiatric/Behavioral: Negative for confusion.    Allergies  Review of patient's allergies indicates no known allergies.  Home Medications   Current Outpatient Rx  Name  Route  Sig  Dispense  Refill  . naproxen sodium (ANAPROX) 220 MG tablet   Oral   Take 440 mg by mouth 2 (two) times daily as needed (for pain).           BP 112/54  Pulse 132  Temp(Src) 100.6 F (38.1 C) (Oral)  SpO2 100%  Breastfeeding? No  Physical Exam  Nursing note and vitals reviewed. Constitutional: She is oriented to person, place, and time. She appears well-developed and well-nourished. No distress.  HENT:  Mouth/Throat: Oropharynx is clear and moist.  Eyes: Conjunctivae are normal. No scleral icterus.  Neck: Neck supple. No tracheal deviation present.  Cardiovascular: Normal rate, regular rhythm, normal heart sounds and intact distal pulses.   Pulmonary/Chest: Effort normal and breath sounds normal. No respiratory distress.  Abdominal: Soft. Normal appearance and bowel sounds are normal. She exhibits no distension and no mass. There is no tenderness. There is no rebound and  no guarding.  Genitourinary:  Right cva tenderness  Musculoskeletal: She exhibits no edema.  Neurological: She is alert and oriented to person, place, and time.  Skin: Skin is warm and dry. No rash noted. She is not diaphoretic.  Psychiatric: She has a normal mood and affect.    ED Course  Procedures (including critical care time)  Results for orders placed during the hospital encounter of 01/31/13  URINALYSIS, ROUTINE W REFLEX MICROSCOPIC      Result Value Range   Color, Urine YELLOW  YELLOW    APPearance TURBID (*) CLEAR   Specific Gravity, Urine 1.020  1.005 - 1.030   pH 7.0  5.0 - 8.0   Glucose, UA NEGATIVE  NEGATIVE mg/dL   Hgb urine dipstick MODERATE (*) NEGATIVE   Bilirubin Urine NEGATIVE  NEGATIVE   Ketones, ur 15 (*) NEGATIVE mg/dL   Protein, ur >161 (*) NEGATIVE mg/dL   Urobilinogen, UA 1.0  0.0 - 1.0 mg/dL   Nitrite POSITIVE (*) NEGATIVE   Leukocytes, UA LARGE (*) NEGATIVE  CBC WITH DIFFERENTIAL      Result Value Range   WBC 14.0 (*) 4.0 - 10.5 K/uL   RBC 4.74  3.87 - 5.11 MIL/uL   Hemoglobin 10.9 (*) 12.0 - 15.0 g/dL   HCT 09.6 (*) 04.5 - 40.9 %   MCV 70.0 (*) 78.0 - 100.0 fL   MCH 23.0 (*) 26.0 - 34.0 pg   MCHC 32.8  30.0 - 36.0 g/dL   RDW 81.1 (*) 91.4 - 78.2 %   Platelets 323  150 - 400 K/uL   Neutrophils Relative 77  43 - 77 %   Lymphocytes Relative 15  12 - 46 %   Monocytes Relative 8  3 - 12 %   Eosinophils Relative 0  0 - 5 %   Basophils Relative 0  0 - 1 %   Neutro Abs 10.8 (*) 1.7 - 7.7 K/uL   Lymphs Abs 2.1  0.7 - 4.0 K/uL   Monocytes Absolute 1.1 (*) 0.1 - 1.0 K/uL   Eosinophils Absolute 0.0  0.0 - 0.7 K/uL   Basophils Absolute 0.0  0.0 - 0.1 K/uL   RBC Morphology ELLIPTOCYTES     WBC Morphology ATYPICAL LYMPHOCYTES    URINE MICROSCOPIC-ADD ON      Result Value Range   Squamous Epithelial / LPF RARE  RARE   WBC, UA TOO NUMEROUS TO COUNT  <3 WBC/hpf   RBC / HPF 7-10  <3 RBC/hpf   Bacteria, UA MANY (*) RARE  POCT PREGNANCY, URINE      Result Value Range   Preg Test, Ur NEGATIVE  NEGATIVE  POCT I-STAT, CHEM 8      Result Value Range   Sodium 140  135 - 145 mEq/L   Potassium 3.7  3.5 - 5.1 mEq/L   Chloride 104  96 - 112 mEq/L   BUN 15  6 - 23 mg/dL   Creatinine, Ser 9.56  0.50 - 1.10 mg/dL   Glucose, Bld 85  70 - 99 mg/dL   Calcium, Ion 2.13  1.12 - 1.23 mmol/L   TCO2 26  0 - 100 mmol/L   Hemoglobin 12.6  12.0 - 15.0 g/dL   HCT 08.6  57.8 - 46.9 %        MDM  Labs/hx/exam c/w pyelo.  Iv ns bolus. Rocephin iv.   Pt took bus  here. Morphine iv for pain. zofran iv.   Pt eating/drinking, feels much improved. No  nv. Pain improved.  Appears stable for d/c.           Suzi Roots, MD 01/31/13 [phone]

## 2013-01-31 NOTE — ED Notes (Signed)
Pt able to keep down sandwich and liquids with no problem. C/o pain rates pain 7/10.

## 2013-01-31 NOTE — ED Notes (Signed)
Pt. Having rt. Flank pain  Began a few days ago.  Pt. Is 3 months post-partum and reported that she had pre-eclampsia.  She denies any n/v/d.  She denies any hematuria or dysuria.  She denies any vaginal bleeding or discharge

## 2013-02-02 LAB — URINE CULTURE

## 2013-02-03 ENCOUNTER — Telehealth (HOSPITAL_COMMUNITY): Payer: Self-pay | Admitting: Emergency Medicine

## 2013-02-03 NOTE — ED Notes (Signed)
Patient has +Urine culture. °

## 2013-02-03 NOTE — ED Notes (Signed)
+  Urine. Patient treated with Keflex. Sensitive to same. Per protocol MD. °

## 2013-03-11 ENCOUNTER — Emergency Department (HOSPITAL_COMMUNITY)
Admission: EM | Admit: 2013-03-11 | Discharge: 2013-03-11 | Disposition: A | Discharge status: Home / Self Care | Payer: Medicaid Other | Attending: Emergency Medicine | Admitting: Emergency Medicine

## 2013-03-11 ENCOUNTER — Encounter (HOSPITAL_COMMUNITY): Payer: Self-pay | Admitting: *Deleted

## 2013-03-11 DIAGNOSIS — G43909 Migraine, unspecified, not intractable, without status migrainosus: Secondary | ICD-10-CM

## 2013-03-11 DIAGNOSIS — M7989 Other specified soft tissue disorders: Secondary | ICD-10-CM | POA: Insufficient documentation

## 2013-03-11 DIAGNOSIS — Z8679 Personal history of other diseases of the circulatory system: Secondary | ICD-10-CM | POA: Insufficient documentation

## 2013-03-11 DIAGNOSIS — Z87448 Personal history of other diseases of urinary system: Secondary | ICD-10-CM | POA: Insufficient documentation

## 2013-03-11 DIAGNOSIS — Z87891 Personal history of nicotine dependence: Secondary | ICD-10-CM | POA: Insufficient documentation

## 2013-03-11 MED ORDER — IBUPROFEN 600 MG PO TABS: 600.0000 mg | ORAL_TABLET | Freq: Four times a day (QID) | ORAL | Status: DC | PRN

## 2013-03-11 MED ORDER — METOCLOPRAMIDE HCL 10 MG PO TABS
10.0000 mg | ORAL_TABLET | Freq: Once | ORAL | Status: AC
  Administered 2013-03-11: 10 mg via ORAL
  Filled 2013-03-11: qty 1

## 2013-03-11 MED ORDER — METOCLOPRAMIDE HCL 10 MG PO TABS: 10.0000 mg | ORAL_TABLET | Freq: Four times a day (QID) | ORAL | Status: DC | PRN

## 2013-03-11 MED ORDER — TRAMADOL HCL 50 MG PO TABS: 50.0000 mg | ORAL_TABLET | Freq: Four times a day (QID) | ORAL | Status: DC | PRN

## 2013-03-11 MED ORDER — DIPHENHYDRAMINE HCL 50 MG/ML IJ SOLN: 25.0000 mg | Freq: Once | INTRAMUSCULAR | Status: DC

## 2013-03-11 MED ORDER — DIPHENHYDRAMINE HCL 25 MG PO CAPS
25.0000 mg | ORAL_CAPSULE | Freq: Once | ORAL | Status: AC
  Administered 2013-03-11: 25 mg via ORAL
  Filled 2013-03-11: qty 1

## 2013-03-11 MED ORDER — KETOROLAC TROMETHAMINE 60 MG/2ML IM SOLN
60.0000 mg | Freq: Once | INTRAMUSCULAR | Status: AC
  Administered 2013-03-11: 60 mg via INTRAMUSCULAR
  Filled 2013-03-11: qty 2

## 2013-03-11 NOTE — ED Notes (Signed)
Here last month for kidney infection and hypotension; after d/c pt. Has been having h/a's and swelling in fingers.

## 2013-03-11 NOTE — ED Provider Notes (Signed)
History    This chart was scribed for Loren Racer, MD by Leone Payor, ED Scribe. This patient was seen in room TR06C/TR06C and the patient's care was started 10:17 PM.   CSN: [id number]  Arrival date & time 03/11/13  2109   None     Chief Complaint  Patient presents with  . Arm Swelling  . Headache     The history is provided by the patient. No language interpreter was used.    HPI Comments: Angela Henson is a 22 y.o. female who presents to the Emergency Department complaining of ongoing HAs and swelling in the fingers starting last month. Pt states she was seen in the ED last month for a kidney infection and hypotension. Since she has been discharged, pt has been having these HA and swelling symptoms. Pt states the latest HA has been ongoing and intermittent for 3 days. Describes the pain as throbbing and is localized to the temples. She has associated photophobia, phonophobia. She has taken aleve for the HA with minimal relief. She denies any nausea.    Past Medical History  Diagnosis Date  . No pertinent past medical history   . Headache     Past Surgical History  Procedure Laterality Date  . Wisdom tooth extraction    . Incision and drainage abscess anal  2013    I&D of right buttock  . Cesarean section  11/03/2012    Procedure: CESAREAN SECTION;  Surgeon: Bing Plume, MD;  Location: [facility];  Service: Obstetrics;  Laterality: N/A;  Primary cesarean section with delivery of baby boy at 6. Apgars 8/9.    Family History  Problem Relation Age of Onset  . Hypotension Neg Hx     History  Substance Use Topics  . Smoking status: Former Smoker -- 0.50 packs/day    Types: Cigarettes  . Smokeless tobacco: Not on file  . Alcohol Use: No    OB History   Grav Para Term Preterm Abortions TAB SAB Ect Mult Living   1 1 0 1 0 0 0 0 0 1       Review of Systems  Eyes: Positive for photophobia.  Gastrointestinal: Negative for nausea.  Musculoskeletal: Positive for  joint swelling (fingers).  Neurological: Positive for headaches.  All other systems reviewed and are negative.    Allergies  Review of patient's allergies indicates no known allergies.  Home Medications   Current Outpatient Rx  Name  Route  Sig  Dispense  Refill  . ibuprofen (ADVIL,MOTRIN) 600 MG tablet   Oral   Take 1 tablet (600 mg total) by mouth every 6 (six) hours as needed for pain.   30 tablet   0   . metoCLOPramide (REGLAN) 10 MG tablet   Oral   Take 1 tablet (10 mg total) by mouth every 6 (six) hours as needed (nausea/headache).   6 tablet   0   . traMADol (ULTRAM) 50 MG tablet   Oral   Take 1 tablet (50 mg total) by mouth every 6 (six) hours as needed for pain.   15 tablet   0     BP 107/72  Pulse 93  Temp(Src) 98.4 F (36.9 C) (Oral)  SpO2 100%  LMP 02/01/2013  Physical Exam  Nursing note and vitals reviewed. Constitutional: She is oriented to person, place, and time. She appears well-developed and well-nourished. No distress.  HENT:  Head: Normocephalic and atraumatic.  Eyes: EOM are normal.  Neck: Neck supple. No tracheal  deviation present.  No meningismus.   Cardiovascular: Normal rate.   Pulmonary/Chest: Effort normal. No respiratory distress.  Musculoskeletal: Normal range of motion.  Neurological: She is alert and oriented to person, place, and time.  5/5 strength in all extremities. Sensation intact.   Skin: Skin is warm and dry.  Mild swelling from a mosquito bite on proximal aspect of R index finger.   Psychiatric: She has a normal mood and affect. Her behavior is normal.    ED Course  Procedures (including critical care time)  DIAGNOSTIC STUDIES: Oxygen Saturation is 100% on room air, normal by my interpretation.    COORDINATION OF CARE: 10:47 PM Discussed treatment plan with pt at bedside and pt agreed to plan.   Labs Reviewed - No data to display No results found.   1. Migraine       MDM  I personally performed the  services described in this documentation, which was scribed in my presence. The recorded information has been reviewed and is accurate.  neurologically intact. No red flags for HA-gradual onset, no neck stiffness, no fever, no visual changes.  Symptoms consistent with migraine.   Loren Racer, MD 03/12/13 [phone]

## 2013-11-06 ENCOUNTER — Emergency Department (HOSPITAL_COMMUNITY)
Admission: EM | Admit: 2013-11-06 | Discharge: 2013-11-06 | Disposition: A | Discharge status: Home / Self Care | Payer: Medicaid Other | Attending: Emergency Medicine | Admitting: Emergency Medicine

## 2013-11-06 ENCOUNTER — Emergency Department (HOSPITAL_COMMUNITY): Payer: Medicaid Other

## 2013-11-06 ENCOUNTER — Encounter (HOSPITAL_COMMUNITY): Payer: Self-pay | Admitting: Emergency Medicine

## 2013-11-06 DIAGNOSIS — M545 Low back pain, unspecified: Secondary | ICD-10-CM

## 2013-11-06 DIAGNOSIS — S99919A Unspecified injury of unspecified ankle, initial encounter: Secondary | ICD-10-CM

## 2013-11-06 DIAGNOSIS — R079 Chest pain, unspecified: Secondary | ICD-10-CM

## 2013-11-06 DIAGNOSIS — Y92410 Unspecified street and highway as the place of occurrence of the external cause: Secondary | ICD-10-CM | POA: Insufficient documentation

## 2013-11-06 DIAGNOSIS — S8990XA Unspecified injury of unspecified lower leg, initial encounter: Secondary | ICD-10-CM | POA: Insufficient documentation

## 2013-11-06 DIAGNOSIS — IMO0002 Reserved for concepts with insufficient information to code with codable children: Secondary | ICD-10-CM | POA: Insufficient documentation

## 2013-11-06 DIAGNOSIS — S99929A Unspecified injury of unspecified foot, initial encounter: Secondary | ICD-10-CM

## 2013-11-06 DIAGNOSIS — Z87891 Personal history of nicotine dependence: Secondary | ICD-10-CM | POA: Insufficient documentation

## 2013-11-06 DIAGNOSIS — Y9389 Activity, other specified: Secondary | ICD-10-CM | POA: Insufficient documentation

## 2013-11-06 MED ORDER — TRAMADOL HCL 50 MG PO TABS
50.0000 mg | ORAL_TABLET | Freq: Once | ORAL | Status: AC
  Administered 2013-11-06: 50 mg via ORAL
  Filled 2013-11-06: qty 1

## 2013-11-06 MED ORDER — IBUPROFEN 800 MG PO TABS: 800.0000 mg | ORAL_TABLET | Freq: Three times a day (TID) | ORAL | Status: DC

## 2013-11-06 MED ORDER — KETOROLAC TROMETHAMINE 60 MG/2ML IM SOLN
60.0000 mg | Freq: Once | INTRAMUSCULAR | Status: AC
  Administered 2013-11-06: 60 mg via INTRAMUSCULAR
  Filled 2013-11-06: qty 2

## 2013-11-06 NOTE — Discharge Instructions (Signed)
Back Exercises  Back exercises help treat and prevent back injuries. The goal is to increase your strength in your belly (abdominal) and back muscles. These exercises can also help with flexibility. Start these exercises when told by your doctor.  HOME CARE  Back exercises include:  Pelvic Tilt.  · Lie on your back with your knees bent. Tilt your pelvis until the lower part of your back is against the floor. Hold this position 5 to 10 sec. Repeat this exercise 5 to 10 times.  Knee to Chest.  · Pull 1 knee up against your chest and hold for 20 to 30 seconds. Repeat this with the other knee. This may be done with the other leg straight or bent, whichever feels better. Then, pull both knees up against your chest.  Sit-Ups or Curl-Ups.  · Bend your knees 90 degrees. Start with tilting your pelvis, and do a partial, slow sit-up. Only lift your upper half 30 to 45 degrees off the floor. Take at least 2 to 3 seonds for each sit-up. Do not do sit-ups with your knees out straight. If partial sit-ups are difficult, simply do the above but with only tightening your belly (abdominal) muscles and holding it as told.  Hip-Lift.  · Lie on your back with your knees flexed 90 degrees. Push down with your feet and shoulders as you raise your hips 2 inches off the floor. Hold for 10 seconds, repeat 5 to 10 times.  Back Arches.  · Lie on your stomach. Prop yourself up on bent elbows. Slowly press on your hands, causing an arch in your low back. Repeat 3 to 5 times.  Shoulder-Lifts.  · Lie face down with arms beside your body. Keep hips and belly pressed to floor as you slowly lift your head and shoulders off the floor.  Do not overdo your exercises. Be careful in the beginning. Exercises may cause you some mild back discomfort. If the pain lasts for more than 15 minutes, stop the exercises until you see your doctor. Improvement with exercise for back problems is slow.   Document Released: 11/12/2010 Document Revised: 01/02/2012  Document Reviewed: 08/11/2011  [employer]® Patient Information ©2014 [employer].

## 2013-11-06 NOTE — ED Provider Notes (Signed)
Medical screening examination/treatment/procedure(s) were performed by non-physician practitioner and as supervising physician I was immediately available for consultation/collaboration.  EKG Interpretation   None        Angela Batonourtney F Homero Hyson, MD 11/06/13 2009
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
## 2014-03-05 ENCOUNTER — Encounter (HOSPITAL_COMMUNITY): Payer: Self-pay | Admitting: Emergency Medicine

## 2014-03-05 ENCOUNTER — Emergency Department (HOSPITAL_COMMUNITY)
Admission: EM | Admit: 2014-03-05 | Discharge: 2014-03-05 | Disposition: A | Discharge status: Home / Self Care | Payer: Medicaid Other | Attending: Emergency Medicine | Admitting: Emergency Medicine

## 2014-03-05 DIAGNOSIS — F1994 Other psychoactive substance use, unspecified with psychoactive substance-induced mood disorder: Secondary | ICD-10-CM | POA: Insufficient documentation

## 2014-03-05 DIAGNOSIS — F329 Major depressive disorder, single episode, unspecified: Secondary | ICD-10-CM | POA: Insufficient documentation

## 2014-03-05 DIAGNOSIS — R45851 Suicidal ideations: Secondary | ICD-10-CM | POA: Insufficient documentation

## 2014-03-05 DIAGNOSIS — F39 Unspecified mood [affective] disorder: Secondary | ICD-10-CM | POA: Diagnosis present

## 2014-03-05 DIAGNOSIS — F1210 Cannabis abuse, uncomplicated: Secondary | ICD-10-CM | POA: Insufficient documentation

## 2014-03-05 DIAGNOSIS — F1910 Other psychoactive substance abuse, uncomplicated: Secondary | ICD-10-CM

## 2014-03-05 DIAGNOSIS — F17200 Nicotine dependence, unspecified, uncomplicated: Secondary | ICD-10-CM | POA: Insufficient documentation

## 2014-03-05 DIAGNOSIS — F3289 Other specified depressive episodes: Secondary | ICD-10-CM | POA: Insufficient documentation

## 2014-03-05 DIAGNOSIS — Z3202 Encounter for pregnancy test, result negative: Secondary | ICD-10-CM | POA: Insufficient documentation

## 2014-03-05 DIAGNOSIS — F32A Depression, unspecified: Secondary | ICD-10-CM

## 2014-03-05 LAB — COMPREHENSIVE METABOLIC PANEL
ALBUMIN: 4.3 g/dL (ref 3.5–5.2)
ALK PHOS: 77 U/L (ref 39–117)
ALT: 11 U/L (ref 0–35)
AST: 18 U/L (ref 0–37)
BILIRUBIN TOTAL: 0.3 mg/dL (ref 0.3–1.2)
BUN: 7 mg/dL (ref 6–23)
CHLORIDE: 100 meq/L (ref 96–112)
CO2: 26 meq/L (ref 19–32)
Calcium: 9.6 mg/dL (ref 8.4–10.5)
Creatinine, Ser: 0.62 mg/dL (ref 0.50–1.10)
GFR calc Af Amer: >90 mL/min (ref >90)
Glucose, Bld: 84 mg/dL (ref 70–99)
POTASSIUM: 3.7 meq/L (ref 3.7–5.3)
Sodium: 142 mEq/L (ref 137–147)
Total Protein: 8.4 g/dL — ABNORMAL HIGH (ref 6.0–8.3)

## 2014-03-05 LAB — CBC
HEMATOCRIT: 40.1 % (ref 36.0–46.0)
HEMOGLOBIN: 13.2 g/dL (ref 12.0–15.0)
MCH: 25.8 pg — ABNORMAL LOW (ref 26.0–34.0)
MCHC: 32.9 g/dL (ref 30.0–36.0)
MCV: 78.3 fL (ref 78.0–100.0)
Platelets: 353 10*3/uL (ref 150–400)
RBC: 5.12 MIL/uL — AB (ref 3.87–5.11)
RDW: 15.2 % (ref 11.5–15.5)
WBC: 6.5 10*3/uL (ref 4.0–10.5)

## 2014-03-05 LAB — RAPID URINE DRUG SCREEN, HOSP PERFORMED
AMPHETAMINES: NOT DETECTED
BARBITURATES: NOT DETECTED
BENZODIAZEPINES: NOT DETECTED
Cocaine: NOT DETECTED
Opiates: NOT DETECTED
Tetrahydrocannabinol: POSITIVE — AB

## 2014-03-05 LAB — POC URINE PREG, ED: Preg Test, Ur: NEGATIVE

## 2014-03-05 LAB — ACETAMINOPHEN LEVEL

## 2014-03-05 LAB — SALICYLATE LEVEL: Salicylate Lvl: <2 mg/dL — ABNORMAL LOW (ref 2.8–20.0)

## 2014-03-05 LAB — ETHANOL: ALCOHOL ETHYL (B): 11 mg/dL (ref 0–11)

## 2014-03-05 NOTE — ED Provider Notes (Signed)
CSN: [id number]     Arrival date & time 03/05/14  1225 History   First MD Initiated Contact with Patient 03/05/14 1240     Chief Complaint  Patient presents with  . Medical Clearance     (Consider location/radiation/quality/duration/timing/severity/associated sxs/prior Treatment) HPI  This a 23 year old female who presents requesting help for "my drug problem." Patient reports that she takes ecstasy and molly. She denies any alcohol, cocaine, or heroin abuse. Patient states that the drug use makes her depressed. She states "I just don't know if I want to be here anymore." She denies any plan for suicide and denies any history of suicide attempts.   She denies any HI.   Past Medical History  Diagnosis Date  . No pertinent past medical history   . [id number](478.2Headache(784.0)    Past Surgical History  Procedure Laterality Date  . Wisdom tooth extraction    . Incision and drainage abscess anal  2013    I&D of right buttock  . Cesarean section  11/03/2012    Procedure: CESAREAN SECTION;  Surgeon: Bing Plumehomas F Henley, MD;  Location: [facility];  Service: Obstetrics;  Laterality: N/A;  Primary cesarean section with delivery of baby boy at [phone]. Apgars 8/9.   Family History  Problem Relation Age of Onset  . Hypotension Neg Hx    History  Substance Use Topics  . Smoking status: Former Smoker -- 0.50 packs/day    Types: Cigarettes  . Smokeless tobacco: Not on file  . Alcohol Use: Yes   OB History   Grav Para Term Preterm Abortions TAB SAB Ect Mult Living   1 1 0 1 0 0 0 0 0 1      Review of Systems  Constitutional: Negative for fever.  Respiratory: Negative for chest tightness and shortness of breath.   Cardiovascular: Negative for chest pain.  Gastrointestinal: Negative for abdominal pain.  Genitourinary: Negative for dysuria.  Neurological: Negative for headaches.  Psychiatric/Behavioral: Positive for suicidal ideas. Negative for self-injury.  All other systems reviewed and are  negative.     Allergies  No known allergies  Home Medications   Prior to Admission medications   Not on File   BP 118/82  Pulse 95  Temp(Src) 98.7 F (37.1 C) (Oral)  Resp 18  SpO2 100% Physical Exam  Nursing note and vitals reviewed. Constitutional: She is oriented to person, place, and time. She appears well-developed and well-nourished. No distress.  HENT:  Head: Normocephalic and atraumatic.  Cardiovascular: Normal rate, regular rhythm and normal heart sounds.   No murmur heard. Pulmonary/Chest: Effort normal and breath sounds normal. No respiratory distress. She has no wheezes.  Abdominal: Soft. There is no tenderness.  Neurological: She is alert and oriented to person, place, and time.  Skin: Skin is warm and dry.  Psychiatric:  Flat affect    ED Course  Procedures (including critical care time) Labs Review Labs Reviewed  CBC - Abnormal; Notable for the following:    RBC 5.12 (*)    MCH 25.8 (*)    All other components within normal limits  COMPREHENSIVE METABOLIC PANEL - Abnormal; Notable for the following:    Total Protein 8.4 (*)    All other components within normal limits  SALICYLATE LEVEL - Abnormal; Notable for the following:    Salicylate Lvl <2.0 (*)    All other components within normal limits  URINE RAPID DRUG SCREEN (HOSP PERFORMED) - Abnormal; Notable for the following:    Tetrahydrocannabinol POSITIVE (*)  All other components within normal limits  ACETAMINOPHEN LEVEL  ETHANOL  POC URINE PREG, ED    Imaging Review No results found.   EKG Interpretation None      MDM   Final diagnoses:  Polysubstance abuse  Substance induced mood disorder  Depressive disorder  substance induced mood disorder    Patient reports a requesting help with "her drug problem." She also reports passive suicidal ideation. She has no formal plan. Will place psych holding orders and have her evaluated by TTS.    Shon Batonourtney F Horton, MD 03/05/14  [phone]

## 2014-03-05 NOTE — ED Notes (Signed)
Spoke with pt's mother who brought her in.  States that she has been telling her mother that she wants to kill herself.  Mother has had her in counseling for SI.  Pt has a 8515 month old child whom she absolutely does not want.  Child is being cared for by pt's mother.  States that the father of the child called saying that pt was on drugs.  Pt called mother and wanted her to come pick her up.  Came in for eval today.  Pt's mother states that she is afraid that pt will hurt the child.

## 2014-03-05 NOTE — Discharge Instructions (Signed)
Depression, Adult °Depression refers to feeling sad, low, down in the dumps, blue, gloomy, or empty. In general, there are two kinds of depression: °1. Depression that we all experience from time to time because of upsetting life experiences, including the loss of a job or the ending of a relationship (normal sadness or normal grief). This kind of depression is considered normal, is short lived, and resolves within a few days to 2 weeks. (Depression experienced after the loss of a loved one is called bereavement. Bereavement often lasts longer than 2 weeks but normally gets better with time.) °2. Clinical depression, which lasts longer than normal sadness or normal grief or interferes with your ability to function at home, at work, and in school. It also interferes with your personal relationships. It affects almost every aspect of your life. Clinical depression is an illness. °Symptoms of depression also can be caused by conditions other than normal sadness and grief or clinical depression. Examples of these conditions are listed as follows: °· Physical illness Some physical illnesses, including underactive thyroid gland (hypothyroidism), severe anemia, specific types of cancer, diabetes, uncontrolled seizures, heart and lung problems, strokes, and chronic pain are commonly associated with symptoms of depression. °· Side effects of some prescription medicine In some people, certain types of prescription medicine can cause symptoms of depression. °· Substance abuse Abuse of alcohol and illicit drugs can cause symptoms of depression. °SYMPTOMS °Symptoms of normal sadness and normal grief include the following: °· Feeling sad or crying for short periods of time. °· Not caring about anything (apathy). °· Difficulty sleeping or sleeping too much. °· No longer able to enjoy the things you used to enjoy. °· Desire to be by oneself all the time (social isolation). °· Lack of energy or motivation. °· Difficulty  concentrating or remembering. °· Change in appetite or weight. °· Restlessness or agitation. °Symptoms of clinical depression include the same symptoms of normal sadness or normal grief and also the following symptoms: °· Feeling sad or crying all the time. °· Feelings of guilt or worthlessness. °· Feelings of hopelessness or helplessness. °· Thoughts of suicide or the desire to harm yourself (suicidal ideation). °· Loss of touch with reality (psychotic symptoms). Seeing or hearing things that are not real (hallucinations) or having false beliefs about your life or the people around you (delusions and paranoia). °DIAGNOSIS  °The diagnosis of clinical depression usually is based on the severity and duration of the symptoms. Your caregiver also will ask you questions about your medical history and substance use to find out if physical illness, use of prescription medicine, or substance abuse is causing your depression. Your caregiver also may order blood tests. °TREATMENT  °Typically, normal sadness and normal grief do not require treatment. However, sometimes antidepressant medicine is prescribed for bereavement to ease the depressive symptoms until they resolve. °The treatment for clinical depression depends on the severity of your symptoms but typically includes antidepressant medicine, counseling with a mental health professional, or a combination of both. Your caregiver will help to determine what treatment is best for you. °Depression caused by physical illness usually goes away with appropriate medical treatment of the illness. If prescription medicine is causing depression, talk with your caregiver about stopping the medicine, decreasing the dose, or substituting another medicine. °Depression caused by abuse of alcohol or illicit drugs abuse goes away with abstinence from these substances. Some adults need professional help in order to stop drinking or using drugs. °SEEK IMMEDIATE CARE IF: °· You have   thoughts  about hurting yourself or others.  You lose touch with reality (have psychotic symptoms).  You are taking medicine for depression and have a serious side effect. FOR MORE INFORMATION National Alliance on Mental Illness: www.nami.[employer]org National Institute of Mental Health: http://www.maynard.net/www.nimh.nih.gov Document Released: 10/07/2000 Document Revised: 04/10/2012 Document Reviewed: 01/09/2012 [facility] Patient Information 2014 [place], [place].  Chemical Dependency Chemical dependency is an addiction to drugs or alcohol. It is characterized by the repeated behavior of seeking out and using drugs and alcohol despite harmful consequences to the health and safety of ones self and others.  RISK FACTORS There are certain situations or behaviors that increase a person's risk for chemical dependency. These include:  A family history of chemical dependency.  A history of mental health issues, including depression and anxiety.  A home environment where drugs and alcohol are easily available to you.  Drug or alcohol use at a young age. SYMPTOMS  The following symptoms can indicate chemical dependency:  Inability to limit the use of drugs or alcohol.  Nausea, sweating, shakiness, and anxiety that occurs when alcohol or drugs are not being used.  An increase in amount of drugs or alcohol that is necessary to get drunk or high. People who experience these symptoms can assess their use of drugs and alcohol by asking themselves the following questions:  Have you been told by friends or family that they are worried about your use of alcohol or drugs?  Do friends and family ever tell you about things you did while drinking alcohol or using drugs that you do not remember?  Do you lie about using alcohol or drugs or about the amounts you use?  Do you have difficulty completing daily tasks unless you use alcohol or drugs?  Is the level of your work or school performance lower because of your drug or alcohol  use?  Do you get sick from using drugs or alcohol but keep using anyway?  Do you feel uncomfortable in social situations unless you use alcohol or drugs?  Do you use drugs or alcohol to help forget problems? An answer of yes to any of these questions may indicate chemical dependency. Professional evaluation is suggested. Document Released: 10/04/2001 Document Revised: 01/02/2012 Document Reviewed: 12/16/2010 [facility] Patient Information 2014 [employer]mediaExitCare, [place]LLC.  Drug Abuse and Addiction in Sports There are many types of drugs that one may become addicted to including illegal drugs (marijuana, cocaine, amphetamines, hallucinogens, and narcotics), prescription drugs (hydrocodone, codeine, and alprazolam), and other chemicals such as alcohol or nicotine. Two types of addiction exist: physical and emotional. Physical addiction usually occurs after prolonged use of a drug. However, some drugs may only take a couple uses before addiction can occur. Physical addiction is marked by withdrawal symptoms, in which the person experiences negative symptoms such as sweat, anxiety, tremors, hallucinations, or cravings in the absence of using the drug. Emotional dependence is the psychological desire for the "high" that the drugs produce when taken. SYMPTOMS   Inattentiveness.  Negligence.  Forgetfulness.  Insomnia.  Mood swings. RISK INCREASES WITH:   Family history of addiction.  Personal history of addictive personality. Studies have shown that risktakers, which many athletes are, have a higher risk of addiction. PREVENTION The only adequate prevention of drug abuse is abstinence from drugs. TREATMENT  The first step in quitting substance abuse is recognizing the problem and realizing that one has the power to change. Quitting requires a plan and support from others. It is often necessary to seek medical  assistance. Caregivers are available to offer counseling, and for certain cases, medicine to  diminish the physical symptoms of withdrawal. Many organizations exist such as Alcoholics Anonymous, Narcotics Anonymous, or the [employer] Council on Alcoholism that offer support for individuals who have chosen to quit their habits. Document Released: 10/10/2005 Document Revised: 01/02/2012 Document Reviewed: 01/22/2009 [facility] Patient Information 2014 [place]ExitCare, [place]LL[place].  Polysubstance Abuse When people abuse more than one drug or type of drug it is called polysubstance or polydrug abuse. For example, many smokers also drink alcohol. This is one form of polydrug abuse. Polydrug abuse also refers to the use of a drug to counteract an unpleasant effect produced by another drug. It may also be used to help with withdrawal from another drug. People who take stimulants may become agitated. Sometimes this agitation is countered with a tranquilizer. This helps protect against the unpleasant side effects. Polydrug abuse also refers to the use of different drugs at the same time.  Anytime drug use is interfering with normal living activities, it has become abuse. This includes problems with family and friends. Psychological dependence has developed when your mind tells you that the drug is needed. This is usually followed by physical dependence which has developed when continuing increases of drug are required to get the same feeling or "high". This is known as addiction or chemical dependency. A person's risk is much higher if there is a history of chemical dependency in the family. SIGNS OF CHEMICAL DEPENDENCY  You have been told by friends or family that drugs have become a problem.  You fight when using drugs.  You are having blackouts (not remembering what you do while using).  You feel sick from using drugs but continue using.  You lie about use or amounts of drugs (chemicals) used.  You need chemicals to get you going.  You are suffering in work performance or in school because of drug use.  You  get sick from use of drugs but continue to use anyway.  You need drugs to relate to people or feel comfortable in social situations.  You use drugs to forget problems. "Yes" answered to any of the above signs of chemical dependency indicates there are problems. The longer the use of drugs continues, the greater the problems will become. If there is a family history of drug or alcohol use, it is best not to experiment with these drugs. Continual use leads to tolerance. After tolerance develops more of the drug is needed to get the same feeling. This is followed by addiction. With addiction, drugs become the most important part of life. It becomes more important to take drugs than participate in the other usual activities of life. This includes relating to friends and family. Addiction is followed by dependency. Dependency is a condition where drugs are now needed not just to get high, but to feel normal. Addiction cannot be cured but it can be stopped. This often requires outside help and the care of professionals. Treatment centers are listed in the yellow pages under: Cocaine, Narcotics, and Alcoholics Anonymous. Most hospitals and clinics can refer you to a specialized care center. Talk to your caregiver if you need help. Document Released: 06/01/2005 Document Revised: 01/02/2012 Document Reviewed: 10/10/2005 [facility]Exit[facility] Patient Information 2014 [place]ExitCare, [place]LLC.

## 2014-03-05 NOTE — ED Notes (Signed)
Pt states that she "forgot why she is here".  Pt falling asleep during triage.  When asked about a possible drug problem, pt states "Oh yeah, that's right".  Pt states that she uses "x pills and molly".  Pt seems annoyed by several of my questions and keeps saying "I don't know" when asked when drugs became a problem.  Also states she has a problem with alcohol.  Pt also SI w/o HI.  No plan.

## 2014-03-05 NOTE — BHH Counselor (Addendum)
TC to [facility] to get auth # for [facility] - [facility] [place], so [facility]. [facility] tells Clinical research associatewriter that [facility]'t provide [facility] # for [facility], they only provide auth # for [facility]. Writer faxed referral to [facility].  Evette Cristalaroline Paige Marylyn Appenzeller, [place] Assessment Counselor    [facility]. [facility] at [facility].  Evette Cristalaroline Paige Hillari Zumwalt, [place] Assessment Counselor

## 2014-03-05 NOTE — BHH Counselor (Signed)
Writer provided pt with following resources:  [facility] [place] [facility]    [facility] [address]     [address] [place],  [place]     [place], [place] [phone]      [phone] Both a day and evening program   *Accepts MCD  [facility]  [facility] [address]     [address] [place]      [place], [place] [address] [phone]      [phone]  [facility]    [facility] - Intensive Outpatient [address]     [address] [place], [place] [address]     [place], [place]  [phone]      [phone] [address] [place], [place] [address] [phone] *Accepts MCD      [facility]    [facility] ([facility] [address]     [address] [place]      [place], [place] [phone]      [phone] or [phone]  [facility]     [facility] (Several in [place]) [address]    [address] [place] [address]     [place], [place] [phone]      [phone]  [facility]   [facility] ([facility]) [address]     [address] [place], [place] [address]     [place], [place] [phone]      [phone] Admissions: 8am-3pm M-F  Self-Help/Support Groups [facility] [place]   Narcotics Anonymous (NA) Variety of support groups    [facility] [phone] (call for more info)    [address]        [place] - 2 meetings at this location  These referrals have been provided to you as appropriate for your clinical needs while taking into account your financial concerns. Please be aware that agencies, practitioners and insurance companies sometimes change contracts. When  calling to make an appointment have your insurance information available so the professional you are going to see can confirm whether they are covered by your plan. Take this form with you in case the person you are seeing needs a copy or to contact us.  __________________________________________ Assessment Counselor  Gladstone Rosas Paige Juana Haralson, LCSWA Assessment Counselor   

## 2014-03-05 NOTE — Consult Note (Signed)
[facility]   Reason for Consult:  Polysubstance abuse Referring Physician:  EDP  Jessy OtoDebra L Pharo is an 23 y.o. female. Total Time spent with patient: 45 minutes  Assessment: AXIS I:  Depressive Disorder NOS, Substance Abuse and Substance Induced Mood Disorder AXIS II:  Deferred AXIS III:   Past Medical History  Diagnosis Date  . No pertinent past medical history   . Headache(784.0)    AXIS IV:  other psychosocial or environmental problems AXIS V:  61-70 mild symptoms  Plan:  No evidence of imminent risk to self or others at present.   Patient does not meet criteria for psychiatric inpatient admission. Supportive therapy provided about ongoing stressors. Discussed crisis plan, support from social network, calling 911, coming to the Emergency Department, and calling Suicide Hotline.  Subjective:   Jessy OtoDebra L Ben is a 23 y.o. female patient.  HPI:  Patient states "I'm here cause I got a drug problem; I do mollies and ex.  Yes I drink to.  No I don't want to kill myself but sometimes feel like I don't want to be here no more either.  Patient appears to be sleep and under the influence of some drug.  Patient mother at bedside and states that patient has been doing some sort of drug since the age of 23 but she is just recently found out that patient started at that young of age.  States that patient lives at home with mom and her 6715 month old child who the mother is caring for.  Patient is unemployed.  Patient denies suicidal/homicidal ideation, psychosis, and paranoia.  HPI Elements:   Location:  Polysubstance abuse. Quality:  Drug use since the age 23. Severity:  daily drug use. Timing:  years. Review of Systems  Constitutional: Negative for malaise/fatigue and diaphoresis.  Gastrointestinal: Negative for nausea, vomiting and abdominal pain.  Musculoskeletal: Negative.   Neurological: Negative for dizziness, seizures and headaches.  Psychiatric/Behavioral:  Positive for substance abuse. Negative for memory loss. Depression: Substance induced. Suicidal ideas: Passive. The patient is not nervous/anxious and does not have insomnia.    Denies family history of substance abuse or mental illness Past Psychiatric History: Past Medical History  Diagnosis Date  . No pertinent past medical history   . Headache(784.0)     reports that she has quit smoking. Her smoking use included Cigarettes. She smoked 0.50 packs per day. She does not have any smokeless tobacco history on file. She reports that she drinks alcohol. She reports that she uses illicit drugs (Marijuana). Family History  Problem Relation Age of Onset  . Hypotension Neg Hx            Allergies:   Allergies  Allergen Reactions  . No Known Allergies     ACT Assessment Complete:  No:   Past Psychiatric History: Diagnosis:  Depressive Disorder NOS, Substance Abuse and Substance Induced Mood Disorder  Hospitalizations:  denies  Outpatient Care:  Denies  Substance Abuse Care:  Alcohol, mollies, THC, ecstasy,   Self-Mutilation:  Denies  Suicidal Attempts:  Denies  Homicidal Behaviors:  Denies   Violent Behaviors:  Denies   [place] Marital Status:  Single Employed/Unemployed:  Unemployed Education:   Family Supports:  Yes (Mother) Objective: Blood pressure 118/82, pulse 95, temperature 98.7 F (37.1 C), temperature source Oral, resp. rate 18, SpO2 100.00%, not currently breastfeeding.There is no weight on file to calculate BMI. Results for orders placed during the hospital encounter of 03/05/14 (  from the past 72 hour(s))  CBC     Status: Abnormal   Collection Time    03/05/14  1:00 PM      Result Value Ref Range   WBC 6.5  4.0 - 10.5 K/uL   RBC 5.12 (*) 3.87 - 5.11 MIL/uL   Hemoglobin 13.2  12.0 - 15.0 g/dL   HCT 40.940.1  81.136.0 - 91.446.0 %   MCV 78.3  78.0 - 100.0 fL   MCH 25.8 (*) 26.0 - 34.0 pg   MCHC 32.9  30.0 - 36.0 g/dL   RDW 78.215.2  95.611.5 - 21.315.5 %    Platelets 353  150 - 400 K/uL  POC URINE PREG, ED     Status: None   Collection Time    03/05/14  1:19 PM      Result Value Ref Range   Preg Test, Ur NEGATIVE  NEGATIVE   Comment:            THE SENSITIVITY OF THIS     METHODOLOGY IS >24 mIU/mL   Labs are reviewed and no critical values.  Medications reviewed and no changes.  No current facility-administered medications for this encounter.   No current outpatient prescriptions on file.    Psychiatric Specialty Exam:     Blood pressure 118/82, pulse 95, temperature 98.7 F (37.1 C), temperature source Oral, resp. rate 18, SpO2 100.00%, not currently breastfeeding.There is no weight on file to calculate BMI.  General Appearance: Casual  Eye Contact::  Minimal  Speech:  Slow  Volume:  Decreased  Mood:  Under the influence     Affect:  Blunt and Under the influence     Thought Process:  Negative  Orientation:  Full (Time, Place, and Person)  Thought Content:  "What ever my mama say"  Suicidal Thoughts:  No  Patient is   Homicidal Thoughts:  No  Memory:  Immediate;   Fair Recent;   Fair Remote;   Fair  Judgement:  Poor  Insight:  Lacking and Shallow  Psychomotor Activity:  Decreased  Concentration:  Fair  Recall:  F[employer]Fair  Fund of Knowledge:Good  Language: Good  Akathisia:  No  Handed:  Right  AIMS (if indicated):     Assets:  Housing Social Support  Sleep:      Musculoskeletal: Strength & Muscle Tone: within normal limits Gait & Station: normal Patient leans: N/A  Treatment Plan Summary: Will refer patient to [facility] if beds available.  If no beds available will discharge home with resource information and patient can continue to look for rehab treatment from home.    Desteni Piscopo, FNP-BC 03/05/2014 1:38 PM

## 2014-03-05 NOTE — BHH Counselor (Addendum)
Carolyn at [facility] - pt declined b/c [facility] doesn't take pt's w/ ecstasy or molly.  Evette Cristalaroline Paige Jasier Calabretta, [place] Assessment Counselor [phone]   TC from [place] at [facility] [phone]  [phone] she is reviewing info w/ second shift RN and will call [facility] as soon as possible.  Evette Cristalaroline Paige Kaedance Magos, [place] Assessment Counselor [phone]

## 2014-03-06 NOTE — Consult Note (Signed)
Face to face evaluation and I agree with this note 

## 2014-08-25 ENCOUNTER — Encounter (HOSPITAL_COMMUNITY): Payer: Self-pay | Admitting: Emergency Medicine

## 2014-09-16 ENCOUNTER — Encounter (HOSPITAL_COMMUNITY): Payer: Self-pay | Admitting: Emergency Medicine

## 2014-09-16 ENCOUNTER — Emergency Department (HOSPITAL_COMMUNITY)
Admission: EM | Admit: 2014-09-16 | Discharge: 2014-09-16 | Disposition: A | Discharge status: Home / Self Care | Payer: Medicaid Other | Attending: Emergency Medicine | Admitting: Emergency Medicine

## 2014-09-16 DIAGNOSIS — Z720 Tobacco use: Secondary | ICD-10-CM | POA: Insufficient documentation

## 2014-09-16 DIAGNOSIS — Z3202 Encounter for pregnancy test, result negative: Secondary | ICD-10-CM | POA: Insufficient documentation

## 2014-09-16 DIAGNOSIS — N390 Urinary tract infection, site not specified: Secondary | ICD-10-CM | POA: Diagnosis not present

## 2014-09-16 DIAGNOSIS — R109 Unspecified abdominal pain: Secondary | ICD-10-CM | POA: Diagnosis present

## 2014-09-16 LAB — CBC WITH DIFFERENTIAL/PLATELET
BASOS ABS: 0 10*3/uL (ref 0.0–0.1)
Basophils Relative: 0 % (ref 0–1)
Eosinophils Absolute: 0.1 10*3/uL (ref 0.0–0.7)
Eosinophils Relative: 1 % (ref 0–5)
HCT: 44.6 % (ref 36.0–46.0)
Hemoglobin: 14.7 g/dL (ref 12.0–15.0)
LYMPHS ABS: 2.6 10*3/uL (ref 0.7–4.0)
Lymphocytes Relative: 37 % (ref 12–46)
MCH: 28.3 pg (ref 26.0–34.0)
MCHC: 33 g/dL (ref 30.0–36.0)
MCV: 85.8 fL (ref 78.0–100.0)
Monocytes Absolute: 0.3 10*3/uL (ref 0.1–1.0)
Monocytes Relative: 4 % (ref 3–12)
NEUTROS ABS: 4 10*3/uL (ref 1.7–7.7)
Neutrophils Relative %: 58 % (ref 43–77)
PLATELETS: 233 10*3/uL (ref 150–400)
RBC: 5.2 MIL/uL — ABNORMAL HIGH (ref 3.87–5.11)
RDW: 15.5 % (ref 11.5–15.5)
WBC: 7 10*3/uL (ref 4.0–10.5)

## 2014-09-16 LAB — URINALYSIS, ROUTINE W REFLEX MICROSCOPIC
Bilirubin Urine: NEGATIVE
GLUCOSE, UA: NEGATIVE mg/dL
Hgb urine dipstick: NEGATIVE
Ketones, ur: 15 mg/dL — AB
Nitrite: NEGATIVE
PROTEIN: 30 mg/dL — AB
SPECIFIC GRAVITY, URINE: 1.025 (ref 1.005–1.030)
UROBILINOGEN UA: 1 mg/dL (ref 0.0–1.0)
pH: 7.5 (ref 5.0–8.0)

## 2014-09-16 LAB — COMPREHENSIVE METABOLIC PANEL
ALK PHOS: 69 U/L (ref 39–117)
ALT: 12 U/L (ref 0–35)
AST: 20 U/L (ref 0–37)
Albumin: 4.4 g/dL (ref 3.5–5.2)
Anion gap: 11 (ref 5–15)
BUN: 17 mg/dL (ref 6–23)
CHLORIDE: 103 meq/L (ref 96–112)
CO2: 29 meq/L (ref 19–32)
Calcium: 9.6 mg/dL (ref 8.4–10.5)
Creatinine, Ser: 0.61 mg/dL (ref 0.50–1.10)
GFR calc Af Amer: >90 mL/min (ref >90)
GLUCOSE: 98 mg/dL (ref 70–99)
POTASSIUM: 4.1 meq/L (ref 3.7–5.3)
Sodium: 143 mEq/L (ref 137–147)
Total Bilirubin: 0.6 mg/dL (ref 0.3–1.2)
Total Protein: 7.9 g/dL (ref 6.0–8.3)

## 2014-09-16 LAB — PREGNANCY, URINE: Preg Test, Ur: NEGATIVE

## 2014-09-16 LAB — URINE MICROSCOPIC-ADD ON

## 2014-09-16 MED ORDER — CIPROFLOXACIN HCL 500 MG PO TABS: 500.0000 mg | ORAL_TABLET | Freq: Two times a day (BID) | ORAL | Status: DC

## 2014-09-16 MED ORDER — MELOXICAM 15 MG PO TABS
15.0000 mg | ORAL_TABLET | Freq: Once | ORAL | Status: AC
  Administered 2014-09-16: 15 mg via ORAL
  Filled 2014-09-16: qty 1

## 2014-09-16 MED ORDER — MELOXICAM 7.5 MG PO TABS: 15.0000 mg | ORAL_TABLET | Freq: Every day | ORAL | Status: DC

## 2014-09-16 MED ORDER — CIPROFLOXACIN HCL 500 MG PO TABS
500.0000 mg | ORAL_TABLET | Freq: Once | ORAL | Status: AC
  Administered 2014-09-16: 500 mg via ORAL
  Filled 2014-09-16: qty 1

## 2014-09-16 NOTE — ED Notes (Signed)
Pt. Provided bus pass.   

## 2014-09-16 NOTE — ED Provider Notes (Signed)
CSN: [id number]     Arrival date & time 09/16/14  1406 History   First MD Initiated Contact with Patient 09/16/14 1529     Chief Complaint  Patient presents with  . Flank Pain     (Consider location/radiation/quality/duration/timing/severity/associated sxs/prior Treatment) HPI Comments: Patient is a 291P1 23 yo F PMHx significant for tobacco abuse, headaches presenting to the ED for two weeks of right sided flank pain with associated dysuria, urinary frequency, and urgency. No alleviating or aggravating factors. No medications PTA. Denies any fevers, chills, nausea, vomiting, diarrhea, constipation, vaginal bleeding or discharge. No abdominal surgical history. Patient has had irregular menstrual cycles since last year, unknown LMP.   Patient is a 23 y.o. female presenting with flank pain.  Flank Pain    Past Medical History  Diagnosis Date  . No pertinent past medical history   . WUJWJXBJ(478.2Headache(784.0)    Past Surgical History  Procedure Laterality Date  . Wisdom tooth extraction    . Incision and drainage abscess anal  2013    I&D of right buttock  . Cesarean section  11/03/2012    Procedure: CESAREAN SECTION;  Surgeon: Bing Plumehomas F Henley, MD;  Location: [facility];  Service: Obstetrics;  Laterality: N/A;  Primary cesarean section with delivery of baby boy at [id number]. Apgars 8/9.   Family History  Problem Relation Age of Onset  . Hypotension Neg Hx    History  Substance Use Topics  . Smoking status: Current Every Day Smoker -- 0.50 packs/day    Types: Cigarettes  . Smokeless tobacco: Not on file  . Alcohol Use: No   OB History    Gravida Para Term Preterm AB TAB SAB Ectopic Multiple Living   1 1 0 1 0 0 0 0 0 1      Review of Systems  Genitourinary: Positive for dysuria, urgency, frequency and flank pain.  All other systems reviewed and are negative.     Allergies  No known allergies  Home Medications   Prior to Admission medications   Medication Sig Start Date End Date  Taking? Authorizing Provider  ciprofloxacin (CIPRO) 500 MG tablet Take 1 tablet (500 mg total) by mouth every 12 (twelve) hours. 09/16/14   Jayland Null L Royelle Hinchman, PA-C  meloxicam (MOBIC) 7.5 MG tablet Take 2 tablets (15 mg total) by mouth daily. 09/16/14   Nason Conradt L Khyli Swaim, PA-C   BP 100/59 mmHg  Pulse 93  Temp(Src) 98.3 F (36.8 C) (Oral)  Resp 16  Ht 5' (1.524 m)  Wt 100 lb (45.36 kg)  BMI 19.53 kg/m2  SpO2 95%  LMP 11/16/2012 Physical Exam  Constitutional: She is oriented to person, place, and time. She appears well-developed and well-nourished. No distress.  HENT:  Head: Normocephalic and atraumatic.  Right Ear: External ear normal.  Left Ear: External ear normal.  Nose: Nose normal.  Mouth/Throat: Oropharynx is clear and moist. No oropharyngeal exudate.  Eyes: Conjunctivae are normal.  Neck: Normal range of motion. Neck supple.  Cardiovascular: Normal rate, regular rhythm and normal heart sounds.   Pulmonary/Chest: Effort normal and breath sounds normal. No respiratory distress.  Abdominal: Soft. Normal appearance and bowel sounds are normal. She exhibits no distension. There is no tenderness. There is CVA tenderness (mild right sided). There is no rigidity, no rebound and no guarding.  Musculoskeletal: Normal range of motion.  Neurological: She is alert and oriented to person, place, and time.  Skin: Skin is warm and dry. She is not diaphoretic.  Psychiatric: She has a  normal mood and affect.  Nursing note and vitals reviewed.   ED Course  Procedures (including critical care time) Medications  ciprofloxacin (CIPRO) tablet 500 mg (500 mg Oral Given 09/16/14 1603)  meloxicam (MOBIC) tablet 15 mg (15 mg Oral Given 09/16/14 1651)    Labs Review Labs Reviewed  URINALYSIS, ROUTINE W REFLEX MICROSCOPIC - Abnormal; Notable for the following:    APPearance CLOUDY (*)    Ketones, ur 15 (*)    Protein, ur 30 (*)    Leukocytes, UA SMALL (*)    All other components  within normal limits  CBC WITH DIFFERENTIAL - Abnormal; Notable for the following:    RBC 5.20 (*)    All other components within normal limits  URINE MICROSCOPIC-ADD ON - Abnormal; Notable for the following:    Squamous Epithelial / LPF FEW (*)    Bacteria, UA MANY (*)    All other components within normal limits  URINE CULTURE  COMPREHENSIVE METABOLIC PANEL  PREGNANCY, URINE    Imaging Review No results found.   EKG Interpretation None      MDM   Final diagnoses:  UTI (lower urinary tract infection)    Filed Vitals:   09/16/14 1709  BP: 100/59  Pulse:   Temp:   Resp:    Afebrile, NAD, non-toxic appearing, AAOx4.    Pt has been diagnosed with a UTI. Pt is afebrile, mild R sided CVA tenderness, normotensive, and denies N/V. No indications for inpatient treatment. Pt to be dc home with antibiotics and instructions to follow up with PCP if symptoms persist. Return precautions discussed. Patient is agreeable to plan. Patient is stable at time of discharge     Jeannetta EllisJennifer L Dineen Conradt, PA-C 09/17/14 0844  Elwin MochaBlair Walden, MD 09/18/14 [phone]

## 2014-09-16 NOTE — ED Notes (Signed)
Patient states has been having flank pain x 2 weeks.   Patient states she has "alot of pressure when she urinates".   Patient denies other symptoms.

## 2014-09-16 NOTE — Discharge Instructions (Signed)
Please follow up with your primary care physician in 1-2 days. If you do not have one please call the [facility] number listed above. Please take your antibiotic until completion. Please take all medication as prescribed. Please read all discharge instructions and return precautions.   Urinary Tract Infection Urinary tract infections (UTIs) can develop anywhere along your urinary tract. Your urinary tract is your body's drainage system for removing wastes and extra water. Your urinary tract includes two kidneys, two ureters, a bladder, and a urethra. Your kidneys are a pair of bean-shaped organs. Each kidney is about the size of your fist. They are located below your ribs, one on each side of your spine. CAUSES Infections are caused by microbes, which are microscopic organisms, including fungi, viruses, and bacteria. These organisms are so small that they can only be seen through a microscope. Bacteria are the microbes that most commonly cause UTIs. SYMPTOMS  Symptoms of UTIs may vary by age and gender of the patient and by the location of the infection. Symptoms in young women typically include a frequent and intense urge to urinate and a painful, burning feeling in the bladder or urethra during urination. Older women and men are more likely to be tired, shaky, and weak and have muscle aches and abdominal pain. A fever may mean the infection is in your kidneys. Other symptoms of a kidney infection include pain in your back or sides below the ribs, nausea, and vomiting. DIAGNOSIS To diagnose a UTI, your caregiver will ask you about your symptoms. Your caregiver also will ask to provide a urine sample. The urine sample will be tested for bacteria and [place] blood cells. Rueda blood cells are made by your body to help fight infection. TREATMENT  Typically, UTIs can be treated with medication. Because most UTIs are caused by a bacterial infection, they usually can be treated with the use  of antibiotics. The choice of antibiotic and length of treatment depend on your symptoms and the type of bacteria causing your infection. HOME CARE INSTRUCTIONS  If you were prescribed antibiotics, take them exactly as your caregiver instructs you. Finish the medication even if you feel better after you have only taken some of the medication.  Drink enough water and fluids to keep your urine clear or pale yellow.  Avoid caffeine, tea, and carbonated beverages. They tend to irritate your bladder.  Empty your bladder often. Avoid holding urine for long periods of time.  Empty your bladder before and after sexual intercourse.  After a bowel movement, women should cleanse from front to back. Use each tissue only once. SEEK MEDICAL CARE IF:   You have back pain.  You develop a fever.  Your symptoms do not begin to resolve within 3 days. SEEK IMMEDIATE MEDICAL CARE IF:   You have severe back pain or lower abdominal pain.  You develop chills.  You have nausea or vomiting.  You have continued burning or discomfort with urination. MAKE SURE YOU:   Understand these instructions.  Will watch your condition.  Will get help right away if you are not doing well or get worse. Document Released: 07/20/2005 Document Revised: 04/10/2012 Document Reviewed: 11/18/2011 [facility] Patient Information 2015 [employer] [place]. This information is not intended to replace advice given to you by your health care provider. Make sure you discuss any questions you have with your health care provider.

## 2014-09-19 LAB — URINE CULTURE

## 2014-09-22 ENCOUNTER — Telehealth (HOSPITAL_COMMUNITY): Payer: Self-pay

## 2014-09-22 NOTE — Telephone Encounter (Signed)
Post ED Visit - Positive Culture Follow-up  Culture report reviewed by antimicrobial stewardship pharmacist: []  Wes Dulaney, Pharm.D., BCPS []  Celedonio MiyamotoJeremy Frens, Pharm.D., BCPS []  Georgina PillionElizabeth Martin, Pharm.D., BCPS [x]  LindenMinh Pham, VermontPharm.D., BCPS, AAHIVP []  Estella HuskMichelle Turner, Pharm.D., BCPS, AAHIVP []  Babs BertinHaley Baird, Pharm.D.   Positive Urine culture Treated with Ciprofloxacin, organism sensitive to the same and no further patient follow-up is required at this time.  Arvid RightClark, Franciso Dierks Dorn 09/22/2014, 6:06 AM

## 2014-10-08 ENCOUNTER — Encounter (HOSPITAL_COMMUNITY): Payer: Self-pay

## 2014-10-08 ENCOUNTER — Emergency Department (HOSPITAL_COMMUNITY)
Admission: EM | Admit: 2014-10-08 | Discharge: 2014-10-08 | Disposition: A | Discharge status: Other Acute Inpatient Hospital | Payer: Medicaid Other | Attending: Emergency Medicine | Admitting: Emergency Medicine

## 2014-10-08 ENCOUNTER — Encounter (HOSPITAL_COMMUNITY): Payer: Self-pay | Admitting: Emergency Medicine

## 2014-10-08 ENCOUNTER — Emergency Department (HOSPITAL_COMMUNITY)
Admission: EM | Admit: 2014-10-08 | Discharge: 2014-10-08 | Disposition: A | Discharge status: Home / Self Care | Payer: Medicaid Other | Attending: Emergency Medicine | Admitting: Emergency Medicine

## 2014-10-08 ENCOUNTER — Encounter (HOSPITAL_COMMUNITY): Payer: Self-pay | Admitting: *Deleted

## 2014-10-08 ENCOUNTER — Emergency Department (HOSPITAL_COMMUNITY): Payer: Medicaid Other

## 2014-10-08 ENCOUNTER — Inpatient Hospital Stay (HOSPITAL_COMMUNITY)
Admission: AD | Admit: 2014-10-08 | Discharge: 2014-10-10 | DRG: 885 | Disposition: A | Discharge status: Home / Self Care | Payer: Medicaid Other | Source: Intra-hospital | Attending: Psychiatry | Admitting: Psychiatry

## 2014-10-08 DIAGNOSIS — R45851 Suicidal ideations: Secondary | ICD-10-CM | POA: Diagnosis present

## 2014-10-08 DIAGNOSIS — F1210 Cannabis abuse, uncomplicated: Secondary | ICD-10-CM

## 2014-10-08 DIAGNOSIS — H109 Unspecified conjunctivitis: Secondary | ICD-10-CM | POA: Diagnosis not present

## 2014-10-08 DIAGNOSIS — H1131 Conjunctival hemorrhage, right eye: Secondary | ICD-10-CM | POA: Diagnosis not present

## 2014-10-08 DIAGNOSIS — Y92410 Unspecified street and highway as the place of occurrence of the external cause: Secondary | ICD-10-CM | POA: Diagnosis not present

## 2014-10-08 DIAGNOSIS — F1010 Alcohol abuse, uncomplicated: Secondary | ICD-10-CM | POA: Diagnosis present

## 2014-10-08 DIAGNOSIS — S0592XA Unspecified injury of left eye and orbit, initial encounter: Secondary | ICD-10-CM | POA: Diagnosis present

## 2014-10-08 DIAGNOSIS — F12288 Cannabis dependence with other cannabis-induced disorder: Secondary | ICD-10-CM | POA: Diagnosis present

## 2014-10-08 DIAGNOSIS — Z720 Tobacco use: Secondary | ICD-10-CM | POA: Insufficient documentation

## 2014-10-08 DIAGNOSIS — Y9389 Activity, other specified: Secondary | ICD-10-CM | POA: Insufficient documentation

## 2014-10-08 DIAGNOSIS — F321 Major depressive disorder, single episode, moderate: Principal | ICD-10-CM | POA: Diagnosis present

## 2014-10-08 DIAGNOSIS — Z791 Long term (current) use of non-steroidal anti-inflammatories (NSAID): Secondary | ICD-10-CM | POA: Insufficient documentation

## 2014-10-08 DIAGNOSIS — Z91040 Latex allergy status: Secondary | ICD-10-CM | POA: Diagnosis not present

## 2014-10-08 DIAGNOSIS — F17210 Nicotine dependence, cigarettes, uncomplicated: Secondary | ICD-10-CM | POA: Diagnosis present

## 2014-10-08 DIAGNOSIS — F1220 Cannabis dependence, uncomplicated: Secondary | ICD-10-CM | POA: Diagnosis present

## 2014-10-08 DIAGNOSIS — Z792 Long term (current) use of antibiotics: Secondary | ICD-10-CM | POA: Insufficient documentation

## 2014-10-08 DIAGNOSIS — F1310 Sedative, hypnotic or anxiolytic abuse, uncomplicated: Secondary | ICD-10-CM | POA: Diagnosis present

## 2014-10-08 DIAGNOSIS — F1920 Other psychoactive substance dependence, uncomplicated: Secondary | ICD-10-CM | POA: Diagnosis present

## 2014-10-08 DIAGNOSIS — F1994 Other psychoactive substance use, unspecified with psychoactive substance-induced mood disorder: Secondary | ICD-10-CM | POA: Diagnosis present

## 2014-10-08 DIAGNOSIS — Y998 Other external cause status: Secondary | ICD-10-CM | POA: Insufficient documentation

## 2014-10-08 DIAGNOSIS — H5712 Ocular pain, left eye: Secondary | ICD-10-CM

## 2014-10-08 LAB — URINALYSIS, ROUTINE W REFLEX MICROSCOPIC
Bilirubin Urine: NEGATIVE
GLUCOSE, UA: NEGATIVE mg/dL
HGB URINE DIPSTICK: NEGATIVE
KETONES UR: NEGATIVE mg/dL
Nitrite: NEGATIVE
PH: 6.5 (ref 5.0–8.0)
Protein, ur: NEGATIVE mg/dL
Specific Gravity, Urine: 1.023 (ref 1.005–1.030)
Urobilinogen, UA: 0.2 mg/dL (ref 0.0–1.0)

## 2014-10-08 LAB — COMPREHENSIVE METABOLIC PANEL
ALBUMIN: 4.1 g/dL (ref 3.5–5.2)
ALT: 16 U/L (ref 0–35)
AST: 19 U/L (ref 0–37)
Alkaline Phosphatase: 76 U/L (ref 39–117)
Anion gap: 16 — ABNORMAL HIGH (ref 5–15)
BUN: 12 mg/dL (ref 6–23)
CALCIUM: 9.7 mg/dL (ref 8.4–10.5)
CO2: 24 mEq/L (ref 19–32)
CREATININE: 0.56 mg/dL (ref 0.50–1.10)
Chloride: 100 mEq/L (ref 96–112)
GFR calc Af Amer: >90 mL/min (ref >90)
GFR calc non Af Amer: >90 mL/min (ref >90)
Glucose, Bld: 81 mg/dL (ref 70–99)
Potassium: 3.3 mEq/L — ABNORMAL LOW (ref 3.7–5.3)
Sodium: 140 mEq/L (ref 137–147)
Total Bilirubin: <0.2 mg/dL — ABNORMAL LOW (ref 0.3–1.2)
Total Protein: 7.9 g/dL (ref 6.0–8.3)

## 2014-10-08 LAB — CBC
HCT: 40.8 % (ref 36.0–46.0)
Hemoglobin: 13.5 g/dL (ref 12.0–15.0)
MCH: 27.3 pg (ref 26.0–34.0)
MCHC: 33.1 g/dL (ref 30.0–36.0)
MCV: 82.4 fL (ref 78.0–100.0)
PLATELETS: 276 10*3/uL (ref 150–400)
RBC: 4.95 MIL/uL (ref 3.87–5.11)
RDW: 14.9 % (ref 11.5–15.5)
WBC: 10.1 10*3/uL (ref 4.0–10.5)

## 2014-10-08 LAB — ETHANOL

## 2014-10-08 LAB — I-STAT CHEM 8, ED
BUN: 15 mg/dL (ref 6–23)
CALCIUM ION: 1.22 mmol/L (ref 1.12–1.23)
CREATININE: 0.5 mg/dL (ref 0.50–1.10)
Chloride: 101 mEq/L (ref 96–112)
Glucose, Bld: 108 mg/dL — ABNORMAL HIGH (ref 70–99)
HCT: 52 % — ABNORMAL HIGH (ref 36.0–46.0)
Hemoglobin: 17.7 g/dL — ABNORMAL HIGH (ref 12.0–15.0)
Potassium: 3.7 mEq/L (ref 3.7–5.3)
Sodium: 141 mEq/L (ref 137–147)
TCO2: 23 mmol/L (ref 0–100)

## 2014-10-08 LAB — ACETAMINOPHEN LEVEL: Acetaminophen (Tylenol), Serum: <15 ug/mL (ref 10–30)

## 2014-10-08 LAB — URINE MICROSCOPIC-ADD ON

## 2014-10-08 LAB — RAPID URINE DRUG SCREEN, HOSP PERFORMED
AMPHETAMINES: NOT DETECTED
Amphetamines: NOT DETECTED
BENZODIAZEPINES: POSITIVE — AB
Barbiturates: NOT DETECTED
Barbiturates: NOT DETECTED
Benzodiazepines: POSITIVE — AB
COCAINE: NOT DETECTED
Cocaine: NOT DETECTED
OPIATES: NOT DETECTED
Opiates: POSITIVE — AB
Tetrahydrocannabinol: POSITIVE — AB
Tetrahydrocannabinol: POSITIVE — AB

## 2014-10-08 LAB — SALICYLATE LEVEL: Salicylate Lvl: <2 mg/dL — ABNORMAL LOW (ref 2.8–20.0)

## 2014-10-08 MED ORDER — ALUM & MAG HYDROXIDE-SIMETH 200-200-20 MG/5ML PO SUSP: 30.0000 mL | ORAL | Status: DC | PRN

## 2014-10-08 MED ORDER — ONDANSETRON HCL 4 MG PO TABS: 4.0000 mg | ORAL_TABLET | Freq: Three times a day (TID) | ORAL | Status: DC | PRN

## 2014-10-08 MED ORDER — NICOTINE 21 MG/24HR TD PT24: 21.0000 mg | MEDICATED_PATCH | Freq: Every day | TRANSDERMAL | Status: DC

## 2014-10-08 MED ORDER — HYDROCODONE-ACETAMINOPHEN 5-325 MG PO TABS
1.0000 | ORAL_TABLET | Freq: Once | ORAL | Status: AC
  Administered 2014-10-08: 1 via ORAL
  Filled 2014-10-08: qty 1

## 2014-10-08 MED ORDER — IBUPROFEN 400 MG PO TABS
600.0000 mg | ORAL_TABLET | Freq: Three times a day (TID) | ORAL | Status: DC | PRN
  Administered 2014-10-08: 600 mg via ORAL
  Filled 2014-10-08 (×2): qty 1

## 2014-10-08 MED ORDER — ACETAMINOPHEN 325 MG PO TABS: 650.0000 mg | ORAL_TABLET | ORAL | Status: DC | PRN

## 2014-10-08 MED ORDER — ZOLPIDEM TARTRATE 5 MG PO TABS: 5.0000 mg | ORAL_TABLET | Freq: Every evening | ORAL | Status: DC | PRN

## 2014-10-08 MED ORDER — LORAZEPAM 1 MG PO TABS
1.0000 mg | ORAL_TABLET | Freq: Three times a day (TID) | ORAL | Status: DC | PRN
  Administered 2014-10-08: 1 mg via ORAL
  Filled 2014-10-08: qty 1

## 2014-10-08 MED ORDER — HYDROCODONE-ACETAMINOPHEN 5-325 MG PO TABS: 1.0000 | ORAL_TABLET | Freq: Four times a day (QID) | ORAL | Status: DC | PRN

## 2014-10-08 MED ORDER — IBUPROFEN 600 MG PO TABS
600.0000 mg | ORAL_TABLET | Freq: Four times a day (QID) | ORAL | Status: DC | PRN
  Administered 2014-10-08: 600 mg via ORAL
  Filled 2014-10-08 (×2): qty 1
  Filled 2014-10-08: qty 10

## 2014-10-08 MED ORDER — IBUPROFEN 400 MG PO TABS: 400.0000 mg | ORAL_TABLET | Freq: Three times a day (TID) | ORAL | Status: DC | PRN

## 2014-10-08 MED ORDER — ACETAMINOPHEN 325 MG PO TABS: 650.0000 mg | ORAL_TABLET | Freq: Four times a day (QID) | ORAL | Status: DC | PRN

## 2014-10-08 MED ORDER — TRAZODONE HCL 50 MG PO TABS
50.0000 mg | ORAL_TABLET | Freq: Every evening | ORAL | Status: DC | PRN
  Administered 2014-10-08 – 2014-10-09 (×2): 50 mg via ORAL
  Filled 2014-10-08 (×4): qty 1
  Filled 2014-10-08: qty 28
  Filled 2014-10-08: qty 1
  Filled 2014-10-08: qty 28
  Filled 2014-10-08 (×2): qty 1

## 2014-10-08 MED ORDER — HYDROXYZINE HCL 25 MG PO TABS
25.0000 mg | ORAL_TABLET | Freq: Four times a day (QID) | ORAL | Status: DC | PRN
  Administered 2014-10-08: 25 mg via ORAL
  Filled 2014-10-08: qty 1

## 2014-10-08 MED ORDER — METRONIDAZOLE 500 MG PO TABS
2000.0000 mg | ORAL_TABLET | Freq: Once | ORAL | Status: AC
  Administered 2014-10-09: 2000 mg via ORAL
  Filled 2014-10-08: qty 4

## 2014-10-08 MED ORDER — METRONIDAZOLE 500 MG PO TABS
2000.0000 mg | ORAL_TABLET | Freq: Once | ORAL | Status: AC
  Administered 2014-10-08: 2000 mg via ORAL
  Filled 2014-10-08: qty 4

## 2014-10-08 MED ORDER — MAGNESIUM HYDROXIDE 400 MG/5ML PO SUSP: 30.0000 mL | Freq: Every day | ORAL | Status: DC | PRN

## 2014-10-08 MED ORDER — POTASSIUM CHLORIDE CRYS ER 20 MEQ PO TBCR
20.0000 meq | EXTENDED_RELEASE_TABLET | Freq: Two times a day (BID) | ORAL | Status: DC
  Administered 2014-10-08 – 2014-10-10 (×3): 20 meq via ORAL
  Filled 2014-10-08 (×6): qty 1

## 2014-10-08 NOTE — ED Notes (Signed)
Pt here with mother c/o drug use and SI; [facility]; pt with poor eye contact and sts supposed to be taking meds for depression
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
## 2014-10-08 NOTE — ED Notes (Signed)
[facility] called for transportation.  

## 2014-10-08 NOTE — ED Notes (Signed)
Patient mother has taken all belongings home with her with pt permission

## 2014-10-08 NOTE — ED Notes (Signed)
PATIENT UP TO PHONE TO CALL HER MOM TO LET HER KNOW SHE IS MOVING TO BEHAVIORAL HEALTH. PT IS TEARFUL AND CURSING THAT SHE WANTS TO JUST GO HOME.

## 2014-10-08 NOTE — ED Notes (Signed)
Pt mother has brought her clothes to take to [facility]

## 2014-10-08 NOTE — ED Notes (Addendum)
[facility] to here to pick up pt at this time. Personal belongings sent with pt.
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
## 2014-10-08 NOTE — ED Notes (Signed)
PATIENT REPORTS THAT SHE THINKS SHE WENT ADS ABOUT HER DRUG USE AND UNDETERMINED AMT OF TIME AGO AND STATES THEY GAVE HER MEDICINE THAT STARTED WITH A "W" TO HELP HER NOT USE DRUGS. STATES MED WAS FOR DEPRESSION. SHE STATES SHE HAS STARTED USING XANAX IN THE PAST WEEK. STATES SHE USES IT ABOUT EVERY OTHER DAY "2 BARS" NOT PRESCRIBED FOR HER. UNSURE OF MG. SHE ALSO REPORTS USING MOLLY EVERY DAY ABOUT 1/2 GRAM. STATES THAT SHE STARTED USING MOLLY AT THE AGE OF 20. STATES SHE ALSO HAS BEEN DRINKING ETOH . STATES SHE DRINKS ABOUT A PINT ON WEEKEND DAYS. STATES USUALLY DOESN'T DRINK DURING THE WEEK. STATES SHE SMOKES MARIJUANA 3-4 TIMES DAILY. DENIES ANY PENDING COURT DATES, STATES SHE IS NOT WORKING. STATES HER FAMILY GIVES HER MONEY WHEN SHE ASKS AND SHE USES IT TO BUY DRUGS.

## 2014-10-08 NOTE — ED Notes (Signed)
StratfordMercedes PA bedside
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
## 2014-10-08 NOTE — ED Notes (Signed)
Pt disposition set, waiting for mother to return with clothes.

## 2014-10-08 NOTE — ED Notes (Signed)
Pt has made her two phone calls for the night.
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
## 2014-10-08 NOTE — BH Assessment (Addendum)
Assessment Note  Angela Henson is a 23 year old female who presented to the ED with suicidal ideation with a plan to overdose. Documentation in the epic chart reports that the patient stated that she would swallow a bottle of pills. Patient states she has no plan to do this.  Patient is also requesting detox from alcohol and Xanax.   During the assessment the patient denied suicidal ideation. Patient did not report why she was experiencing increased depression.  Patient denies any stressful or traumatic recent life events.    Patient reported that she used all of these drugs yesterday and routinely uses them. Patient reports that she drinks a pint of alcohol on the weekends but doesn't drink during the week.   She smokes marijuana 3-4 times daily. She also uses Molly every day about 0.5 g. Patient also recently started using Xanax in the past 2 weeks. She says she uses it every other day.   Patient reports she began using Molly at the age of 23, Xanax two weeks ago and Alcohol since the age of 23.   Patient reports outpatient therapy for substance abuse at [facility] this year.  Patient reports that she stopped going.  Patient reports that she does not remember the month in which she received services.  Patient reports that she was prescribed medication for depression however she stopped taking the medication.    Patient denies prior suicidal attempts. She has no HI, self-harm, hallucinations.   Patient denies physical sexual or emotional abuse.      Axis I: Major Depression, single episode and Polysubstance Abuse  Axis II: Deferred Axis III:  Past Medical History  Diagnosis Date  . No pertinent past medical history   . Headache(784.0)    Axis IV: economic problems, housing problems, occupational problems, other psychosocial or environmental problems, problems related to social environment and problems with primary support group Axis V: 31-40 impairment in reality testing  Past  Medical History:  Past Medical History  Diagnosis Date  . No pertinent past medical history   . ZOXWRUEA(540.9Headache(784.0)     Past Surgical History  Procedure Laterality Date  . Wisdom tooth extraction    . Incision and drainage abscess anal  2013    I&D of right buttock  . Cesarean section  11/03/2012    Procedure: CESAREAN SECTION;  Surgeon: Bing Plumehomas F Henley, MD;  Location: [facility];  Service: Obstetrics;  Laterality: N/A;  Primary cesarean section with delivery of baby boy at [id number]. Apgars 8/9.    Family History:  Family History  Problem Relation Age of Onset  . Hypotension Neg Hx     Social History:  reports that she has been smoking Cigarettes.  She has been smoking about 0.50 packs per day. She does not have any smokeless tobacco history on file. She reports that she uses illicit drugs (Marijuana). She reports that she does not drink alcohol.  Additional Social History:     CIWA: CIWA-Ar BP: 121/77 mmHg Pulse Rate: 100 COWS:    Allergies:  Allergies  Allergen Reactions  . No Known Allergies   . Latex Rash    Yeast infection    Home Medications:  (Not in a hospital admission)  OB/GYN Status:  Patient's last menstrual period was 11/16/2012.  General Assessment Data [facility] Is this a Tele or Face-to-Face Assessment?: Tele Assessment Is this an Initial Assessment or a Re-assessment for this  encounter?: Initial Assessment Living Arrangements: Parent Can pt return to current living arrangement?: Yes Admission Status: Voluntary Is patient capable of signing voluntary admission?: Yes Transfer from: Acute Hospital Referral Source: Self/Family/Friend  Medical Screening Exam [facility] Walk-in ONLY) Medical Exam completed: [facility]  [facility] Living Arrangements: Parent Name of Psychiatrist: NA Name of Therapist: na  Education Status Is patient currently in school?: No Current Grade: NA Highest  grade of school patient has completed: NA Name of school: NA Contact person: NA  Risk to self with the past 6 months Suicidal Ideation: No Suicidal Intent: No Is patient at risk for suicide?: No Suicidal Plan?: No Access to Means: No What has been your use of drugs/alcohol within the last 12 months?: Alcohol, Xanax  (Drinks 1 pint of alcohol 3 times weekly.  ) Previous Attempts/Gestures: No How many times?: 0 Other Self Harm Risks: None Reported Triggers for Past Attempts: Unpredictable Intentional Self Injurious Behavior: None Family Suicide History: No Recent stressful life event(s): Job Loss, Financial Problems Persecutory voices/beliefs?: No Depression: Yes Depression Symptoms: Despondent, Loss of interest in usual pleasures, Feeling worthless/self pity, Fatigue Substance abuse history and/or treatment for substance abuse?: Yes Suicide prevention information given to non-admitted patients: Not applicable  Risk to Others within the past 6 months Homicidal Ideation: No Thoughts of Harm to Others: No Current Homicidal Intent: No Current Homicidal Plan: No Access to Homicidal Means: No Identified Victim: None Reported History of harm to others?: No Assessment of Violence: None Noted Violent Behavior Description: None Reported Does patient have access to weapons?: No Criminal Charges Pending?: No Does patient have a court date: No  Psychosis Hallucinations: None noted Delusions: None noted  Mental Status Report Appear/Hygiene: Disheveled Eye Contact: Fair Motor Activity: Freedom of movement Speech: Logical/coherent Level of Consciousness: Alert Mood: Depressed Affect: Appropriate to circumstance Anxiety Level: None Thought Processes: Coherent, Relevant Judgement: Unimpaired Orientation: Person, Place, Time, Situation Obsessive Compulsive Thoughts/Behaviors: None  Cognitive Functioning Concentration: Decreased Memory: Recent Intact, Remote Intact IQ:  Average Insight: Fair Impulse Control: Fair Appetite: Fair Weight Loss: 0 Weight Gain: 0 Sleep: Decreased Total Hours of Sleep: 4 Vegetative Symptoms: Decreased grooming  ADLScreening ([facility]) Patient's cognitive ability adequate to safely complete daily activities?: Yes Patient able to express need for assistance with ADLs?: Yes Independently performs ADLs?: Yes (appropriate for developmental age)  Prior Inpatient Therapy Prior Inpatient Therapy: No Prior Therapy Dates: NA Prior Therapy Facilty/Provider(s): NA Reason for Treatment: NA  Prior Outpatient Therapy Prior Outpatient Therapy: No Prior Therapy Dates: NA Prior Therapy Facilty/Provider(s): NA Reason for Treatment: NA  ADL Screening (condition at time of admission) Patient's cognitive ability adequate to safely complete daily activities?: Yes Patient able to express need for assistance with ADLs?: Yes Independently performs ADLs?: Yes (appropriate for developmental age)             Advance Directives (For Healthcare) Does patient have an advance directive?: No Would patient like information on creating an advanced directive?: No - patient declined information    Additional Information 1:1 In Past 12 Months?: No CIRT Risk: No Elopement Risk: No Does patient have medical clearance?: No     Disposition: Per Dr. Jama Flavorsobos patient meets criteria for inpatient hospitalization.  [facility]  Disposition Initial Assessment Completed for this Encounter: Yes Disposition of Patient: Other dispositions Other disposition(s): Other (Comment)  On Site Evaluation by:   Reviewed with Physician:    Phillip HealStevenson, Angela Henson 10/08/2014 6:07 PM
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
## 2014-10-08 NOTE — ED Notes (Signed)
Pt ambulates to restroom, instructed on procedure for urine collection, pt returns with cup of clear, cold fluid, sample discarded, ALP made aware.
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
## 2014-10-08 NOTE — ED Provider Notes (Signed)
Patient accepted to behavioral health. Accepting physician is Dr.Cobos  Angela CamelScott T Imogine Carvell, MD 10/08/14 1919

## 2014-10-09 ENCOUNTER — Encounter (HOSPITAL_COMMUNITY): Payer: Self-pay | Admitting: Psychiatry

## 2014-10-09 DIAGNOSIS — F1920 Other psychoactive substance dependence, uncomplicated: Secondary | ICD-10-CM | POA: Diagnosis present

## 2014-10-09 DIAGNOSIS — F1310 Sedative, hypnotic or anxiolytic abuse, uncomplicated: Secondary | ICD-10-CM | POA: Diagnosis present

## 2014-10-09 DIAGNOSIS — F321 Major depressive disorder, single episode, moderate: Secondary | ICD-10-CM | POA: Diagnosis present

## 2014-10-09 DIAGNOSIS — F1099 Alcohol use, unspecified with unspecified alcohol-induced disorder: Secondary | ICD-10-CM

## 2014-10-09 DIAGNOSIS — F1220 Cannabis dependence, uncomplicated: Secondary | ICD-10-CM | POA: Diagnosis present

## 2014-10-09 DIAGNOSIS — F1390 Sedative, hypnotic, or anxiolytic use, unspecified, uncomplicated: Secondary | ICD-10-CM

## 2014-10-09 DIAGNOSIS — F1290 Cannabis use, unspecified, uncomplicated: Secondary | ICD-10-CM

## 2014-10-09 DIAGNOSIS — F1190 Opioid use, unspecified, uncomplicated: Secondary | ICD-10-CM

## 2014-10-09 DIAGNOSIS — F1010 Alcohol abuse, uncomplicated: Secondary | ICD-10-CM | POA: Diagnosis present

## 2014-10-09 LAB — URINE CULTURE: SPECIAL REQUESTS: NORMAL

## 2014-10-09 LAB — TSH: TSH: 0.843 u[IU]/mL (ref 0.350–4.500)

## 2014-10-09 MED ORDER — CHLORDIAZEPOXIDE HCL 25 MG PO CAPS
25.0000 mg | ORAL_CAPSULE | Freq: Four times a day (QID) | ORAL | Status: DC | PRN
  Administered 2014-10-09: 25 mg via ORAL

## 2014-10-09 MED ORDER — ENSURE COMPLETE PO LIQD: 237.0000 mL | Freq: Two times a day (BID) | ORAL | Status: DC

## 2014-10-09 MED ORDER — HYDROXYZINE HCL 25 MG PO TABS
25.0000 mg | ORAL_TABLET | Freq: Four times a day (QID) | ORAL | Status: DC | PRN
  Filled 2014-10-09: qty 10

## 2014-10-09 MED ORDER — CHLORDIAZEPOXIDE HCL 25 MG PO CAPS
ORAL_CAPSULE | ORAL | Status: AC
  Administered 2014-10-09: 25 mg via ORAL
  Filled 2014-10-09: qty 1

## 2014-10-09 MED ORDER — THIAMINE HCL 100 MG/ML IJ SOLN: 100.0000 mg | Freq: Once | INTRAMUSCULAR | Status: DC

## 2014-10-09 MED ORDER — SERTRALINE HCL 25 MG PO TABS
25.0000 mg | ORAL_TABLET | Freq: Every day | ORAL | Status: DC
  Administered 2014-10-10: 25 mg via ORAL
  Filled 2014-10-09 (×2): qty 1
  Filled 2014-10-09: qty 14
  Filled 2014-10-09 (×2): qty 1

## 2014-10-09 MED ORDER — ADULT MULTIVITAMIN W/MINERALS CH
1.0000 | ORAL_TABLET | Freq: Every day | ORAL | Status: DC
  Filled 2014-10-09 (×4): qty 1

## 2014-10-09 MED ORDER — VITAMIN B-1 100 MG PO TABS
100.0000 mg | ORAL_TABLET | Freq: Every day | ORAL | Status: DC
  Administered 2014-10-10: 100 mg via ORAL
  Filled 2014-10-09 (×2): qty 1

## 2014-10-09 MED ORDER — ONDANSETRON 4 MG PO TBDP: 4.0000 mg | ORAL_TABLET | Freq: Four times a day (QID) | ORAL | Status: DC | PRN

## 2014-10-09 MED ORDER — LOPERAMIDE HCL 2 MG PO CAPS: 2.0000 mg | ORAL_CAPSULE | ORAL | Status: DC | PRN

## 2014-10-09 NOTE — Progress Notes (Signed)
D: Patient denies SI/HI/A/V hallucinations; patient reports she has pain around her eyes and face but refuses any compresses or medications at this time  A: Monitored q 15 minutes; patient encouraged to attend groups; patient educated about medications; patient given medications per physician orders; patient encouraged to express feelings and/or concerns;Patient had an unwitnessed outburst that including a loud banging at 1000 hrs after talking with the doctor and discovering that she will not be discharged today; patient is very agitated, yelling, and cursing; patient had expressed earlier during a conversation that she wanted to go home and felt like she could jump out of the window; patient also reported that she had not seen her son in a few days and really wanted to see him patient was redirected and encouraged about her treatment here; patient was informed that she could have visitation and see her son; patient gave verbal permission to be able to talk with her mother Dewayne Hatchnn about bringing her son to visit and to give the code number to her mom and grandmother; patient was informed about the 72 hour discharge but patient reported that she did not want to sign it   R: Patient received some librium for withdrawal but patient refused all other medications; patient has been resting in her bed since this occurrence; this writer will continue to monitor

## 2014-10-09 NOTE — [facility] Suicide Risk Assessment (Signed)
   Nursing information obtained from:    Demographic factors:    Current Mental Status:    Loss Factors:    Historical Factors:    Risk Reduction Factors:    Total Time spent with patient: 30 minutes  CLINICAL FACTORS:   Depression:   Comorbid alcohol abuse/dependence Alcohol/Substance Abuse/Dependencies  Psychiatric Specialty Exam: Physical Exam Please see H&P.   ROS  Blood pressure 92/64, pulse 109, temperature 98.6 F (37 C), temperature source Oral, resp. rate 18, height 4' 11.75" (1.518 m), weight 45.36 kg (100 lb), last menstrual period 11/16/2012, not currently breastfeeding.Body mass index is 19.68 kg/(m^2).   Please see H&P FOR MSE.   SUICIDE RISK:   Mild:  Suicidal ideation of limited frequency, intensity, duration, and specificity.  There are no identifiable plans, no associated intent, mild dysphoria and related symptoms, good self-control (both objective and subjective assessment), few other risk factors, and identifiable protective factors, including available and accessible social support.  PLAN OF CARE:Please see H&P.   I certify that inpatient services furnished can reasonably be expected to improve the patient's condition.  Jovana Rembold MD 10/09/2014, 11:26 AM

## 2014-10-09 NOTE — [facility] Group Notes (Signed)
0900 nursing orientation group   The focus of this group is to educate the patient on the purpose and policies of crisis stabilization and provide a format to answer questions about their admission.  The group details unit policies and expectations of patients while admitted.  Pt did not attend she was with the doctor.
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
## 2014-10-09 NOTE — Progress Notes (Signed)
NUTRITION ASSESSMENT  Pt identified as at risk on the Malnutrition Screen Tool  INTERVENTION: 1. Educated patient on the importance of nutrition and encouraged intake of food and beverages. 2. Discussed weight goals. 3. Supplements: Ensure Complete po BID, each supplement provides 350 kcal and 13 grams of protein  NUTRITION DIAGNOSIS: Unintentional weight loss related to predicted sub-optimal intake as evidenced by per documentation   Goal: Pt to meet >/= 90% of their estimated nutrition needs.  Monitor:  PO intake  Assessment:  Pt admitted with substance and ETOH abuse, SI, and depression.  Per weight documentation history pt has has 3 lb weight loss. The year before pt lost 29 lb. Pt is at nutritional risk, predict sub-optimal intake d/t substance and ETOH abuse.   Pt would benefit from nutritional supplement. RD to order.  Height: Ht Readings from Last 1 Encounters:  10/08/14 4' 11.75" (1.518 m)    Weight: Wt Readings from Last 1 Encounters:  10/08/14 100 lb (45.36 kg)    Weight Hx: Wt Readings from Last 10 Encounters:  10/08/14 100 lb (45.36 kg)  10/08/14 100 lb (45.36 kg)  09/16/14 100 lb (45.36 kg)  11/06/13 103 lb (46.72 kg)  11/05/12 132 lb 6.4 oz (60.056 kg)  10/31/12 124 lb (56.246 kg)  10/23/12 120 lb 9.6 oz (54.704 kg) (35 %*, Z = -0.39)  10/17/12 121 lb 6.4 oz (55.067 kg) (36 %*, Z = -0.35)  07/08/12 103 lb (46.72 kg) (6 %*, Z = -1.56)  03/23/12 96 lb (43.545 kg) (1 %*, Z = -2.17)   * Growth percentiles are based on CDC 2-20 Years data.    BMI:  Body mass index is 19.68 kg/(m^2). Pt meets criteria for normal range based on current BMI.  Estimated Nutritional Needs: Kcal: 25-30 kcal/kg Protein: > 1 gram protein/kg Fluid: 1 ml/kcal  Diet Order: Diet regular Pt is also offered choice of unit snacks mid-morning and mid-afternoon.  Pt is eating as desired.   Lab results and medications reviewed.   Tilda FrancoLindsey Foye Damron, MS, RD, LDN Pager:  [phone] After Hours Pager: [phone]

## 2014-10-09 NOTE — [facility] Group Notes (Signed)
[facility] LCSW Group Therapy  10/09/2014 1:43 PM  Type of Therapy:  Group Therapy  Participation Level:  Invited- Did Not Attend. Pt in room sleeping. Refused to come to group.   Smart, Stanlee Roehrig LCSWA 10/09/2014, 1:43 PM
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
## 2014-10-09 NOTE — BHH Counselor (Signed)
Adult Comprehensive Assessment  Patient ID: Angela Henson, female   DOB: 08/14/1991, 23 y.o.   MRN: [id number]  Information Source: Information source: Patient  Current Stressors:  Educational / Learning stressors: Graduated from [employer] Employment / Job issues: Unemployed at present, used to work as Conservation officer, naturecashier at [employer], lost job and therefore lost day care voucher, got job at [employer] but lost it due to unstable child care Family Relationships: Used to "butt heads" w mother, but now has a reasonable relationship w mother Surveyor, quantityinancial / Lack of resources (include bankruptcy): Receives child support only, limited resources Housing / Lack of housing: Pt and son live w mother, mother willing to take back at discharge Physical health (include injuries & life threatening diseases): No issues reported by patient Social relationships: Has one good friend, baby's father supportive, says "I dont like to talk to people" Substance abuse: Admits to smoking marijuana 3 -4 times/day - says it helps her manage stress and irritability Bereavement / Loss: No issues noted  Living/Environment/Situation:  Living Arrangements: Children, Parent Living conditions (as described by patient or guardian): comfortable home w mother How long has patient lived in current situation?: most of her life What is atmosphere in current home: Comfortable  Family History:  Marital status: Single Does patient have children?: Yes How many children?: 1 How is patient's relationship with their children?: Tomorrea (son, age 73)  Childhood History:  By whom was/is the patient raised?: Mother Description of patient's relationship with caregiver when they were a child: Patient says mother is now supportive, relationship improved when patient had son 2 years ago, had rocky relationship in teen years Patient's description of current relationship with people who raised him/her: "OK", mother is supportive Does patient have  siblings?:  (Unknown) Did patient suffer any verbal/emotional/physical/sexual abuse as a child?: No (patient denies all abuse or neglect, says "I don't want to talk about it") Did patient suffer from severe childhood neglect?: No Has patient ever been sexually abused/assaulted/raped as an adolescent or adult?: No Was the patient ever a victim of a crime or a disaster?: No Witnessed domestic violence?: No Has patient been effected by domestic violence as an adult?: No  Education:  Highest grade of school patient has completed: 12 Currently a student?: No Name of school: [employer] Learning disability?: No  Employment/Work Situation:   Employment situation: Unemployed Patient's job has been impacted by current illness: No What is the longest time patient has a held a job?: less than one year.   Where was the patient employed at that time?: [employer] Has patient ever been in the Eli Lilly and Companymilitary?: No Has patient ever served in combat?: No  Financial Resources:   Financial resources:  (receives child support) Does patient have a Lawyerrepresentative payee or guardian?: No  Alcohol/Substance Abuse:   What has been your use of drugs/alcohol within the last 12 months?:  (marijuana 4 times/day) If attempted suicide, did drugs/alcohol play a role in this?: No Alcohol/Substance Abuse Treatment Hx: Past Tx, Outpatient (Attended [facility]) If yes, describe treatment: SA IOP this year at [facility] Has alcohol/substance abuse ever caused legal problems?: No  Social Support System:   Conservation officer, natureatient's Community Support System: Fair Museum/gallery exhibitions officerDescribe Community Support System: has one good friend, mother supportive Type of faith/religion: none How does patient's faith help to cope with current illness?: na  Leisure/Recreation:   Leisure and Hobbies: could not identify any hobbies/activities; likes to take child to park  Strengths/Needs:   What things does the patient  do well?: could not identify In what areas does patient  struggle / problems for patient: could not identify  Discharge Plan:   Does patient have access to transportation?: No Plan for no access to transportation at discharge: needs bus pass to return home Will patient be returning to same living situation after discharge?: Yes Currently receiving community mental health services: No If no, would patient like referral for services when discharged?: Yes (What county?) ([place], [facility] Does patient have financial barriers related to discharge medications?: Yes Patient description of barriers related to discharge medications: On Medicaid  Summary/Recommendations:   Summary and Recommendations (to be completed by the evaluator):  (Patient is a 23 year old AA female, admitted due to attempted overdose and marijuana use.  Per patient, she uses marijuana to cope w stress and irritability in her life.  Has not found anything else that calms her nerves as well except Xanax  )which she obtains illegally on the street.  Patient says she does not like to talk to people, reluctantly agreed to talk w undersigned, asked "why are people always asking me the same questions."  Was able to be redirected by talking about her young son whom she clearly misses while in the hospital.  Patient wants referral to outpatient therapy for management of irritability and anger - says she gets angry easily and quickly then takes actions that she regrets.  Feels her main problem is her temper, acknowledges that she has damaged relationships by expressions of anger.  Agreeable to medications and perhaps therapy to control her anger.  [facility]  Does not want to return there, [facility] op tx [facility]    Patient will benefit from hospitalization to receive psychoeducation and group therapy services to increase coping skills for and understanding of depression, anxiety and anger, milieu therapy,  medications management, and nursing support.  Patient will develop appropriate coping skills for dealing w overwhelming emotions, stabilize on medications, and develop greater insight into and acceptance of his current illness.  CSW will develop discharge plan to include family support and referral to appropriate after care services  Sallee LangeCunningham, Camile Esters C. 10/09/2014

## 2014-10-09 NOTE — Plan of Care (Signed)
Problem: Ineffective individual coping Goal: LTG: Patient will report a decrease in negative feelings Outcome: Progressing Pt denies SI/HI/AVH at this time     

## 2014-10-10 MED ORDER — SERTRALINE HCL 25 MG PO TABS: 25.0000 mg | ORAL_TABLET | Freq: Every day | ORAL | Status: DC

## 2014-10-10 MED ORDER — IBUPROFEN 400 MG PO TABS: 400.0000 mg | ORAL_TABLET | Freq: Three times a day (TID) | ORAL | Status: DC | PRN

## 2014-10-10 MED ORDER — METRONIDAZOLE 250 MG PO TABS: 250.0000 mg | ORAL_TABLET | Freq: Three times a day (TID) | ORAL | Status: DC

## 2014-10-10 MED ORDER — TRAZODONE HCL 50 MG PO TABS: 50.0000 mg | ORAL_TABLET | Freq: Every evening | ORAL | Status: DC | PRN

## 2014-10-10 MED ORDER — HYDROXYZINE HCL 25 MG PO TABS: 25.0000 mg | ORAL_TABLET | Freq: Four times a day (QID) | ORAL | Status: DC | PRN

## 2014-10-10 MED ORDER — METRONIDAZOLE 250 MG PO TABS
250.0000 mg | ORAL_TABLET | Freq: Three times a day (TID) | ORAL | Status: DC
  Filled 2014-10-10 (×3): qty 1

## 2014-10-10 MED ORDER — METRONIDAZOLE 250 MG PO TABS: 250.0000 mg | ORAL_TABLET | Freq: Two times a day (BID) | ORAL | Status: DC

## 2014-10-10 NOTE — Progress Notes (Addendum)
[facility] :  Will you be returning to the same living situation after discharge: Yes,  returns to mother's home At discharge, do you have transportation home?:Yes,  bus pass given Do you have the ability to pay for your medications:Yes,  on Medicaid  Release of information consent forms completed and submitted to Medical Records by CSW.   Patient to Follow up at: Follow-up Information    [facility] with [facility]. Go on 10/13/2014.   Why:  Please present to [facility] walk in clinic for assessment and intake for services Mon - Fri between 8:30 AM - 12:00 PM   Contact information:   [address] [place]  [phone] - phone    [facility].  Patient denies SI/HI:   Yes,  per MD note    Safety Planning and Suicide Prevention discussed:  Yes,  unable to reach mother, patient given pamphlet and reviewed info w pt.  Patient refused referral  Sallee LangeCunningham, Anne C 10/10/2014, 10:06 AM

## 2014-10-10 NOTE — B[facility] Suicide Risk Assessment (Signed)
[facility] INPATIENT:  Family/Significant Other Suicide Prevention Education  Suicide Prevention Education:  Contact Attempts:  Angela Henson(Carol Soles, mother  of family member/significant other) has been identified by the patient as the family member/significant other with whom the patient will be residing, and identified as the person(s) who will aid the patient in the event of a mental health crisis.  With written consent from the patient, two attempts were made to provide suicide prevention education, prior to and/or following the patient's discharge.  We were unsuccessful in providing suicide prevention education.  A suicide education pamphlet was given to the patient to share with family/significant other.  Date and time of first attempt:10/10/14 10:00 AM  Date and time of second attempt:*10/10/2014 10:25 AM  Patient consented to contact w mother today for SPE, states mother is a Runner, broadcasting/film/videoteacher and cannot be reached during the day.  Patient given SPE pamphlet, pamphlet reviewed.  Patient also given Mobile Crisis Management contact and encouraged to contact in crisis.  Angela Henson, Angela Henson 10/10/2014, 10:09 AM

## 2014-10-10 NOTE — [facility] Suicide Risk Assessment (Signed)
   Demographic Factors:  Unemployed  Total Time spent with patient: 45 minutes  Psychiatric Specialty Exam: Physical Exam  ROS  Blood pressure 110/65, pulse 72, temperature 98.4 F (36.9 C), temperature source Oral, resp. rate 16, height 4' 11.75" (1.518 m), weight 45.36 kg (100 lb), last menstrual period 11/16/2012, not currently breastfeeding.Body mass index is 19.68 kg/(m^2).  General Appearance: Casual  Eye Contact::  Fair  Speech:  Clear and Coherent  Volume:  Normal  Mood:  Euthymic  Affect:  Congruent  Thought Process:  Coherent  Orientation:  Full (Time, Place, and Person)  Thought Content:  WDL  Suicidal Thoughts:  No  Homicidal Thoughts:  No  Memory:  Immediate;   Fair Recent;   Fair Remote;   Fair  Judgement:  Fair  Insight:  Fair  Psychomotor Activity:  Normal  Concentration:  Fair  Recall:  F[employer]Fair  Fund of Knowledge:Fair  Language: Fair  Akathisia:  No  Handed:  Right  AIMS (if indicated):     Assets:  Communication Skills Desire for Improvement  Sleep:  Number of Hours: 6    Musculoskeletal: Strength & Muscle Tone: within normal limits Gait & Station: normal Patient leans: N/A   Mental Status Per Nursing Assessment::   On Admission:     Current Mental Status by Physician: Patient denies SI/HI/AH/VH  Loss Factors: Decrease in vocational status  Historical Factors: Impulsivity  Risk Reduction Factors:   Responsible for children under 21 years of age, Sense of responsibility to family, Living with another person, especially a relative and Positive social support  Continued Clinical Symptoms:  Alcohol/Substance Abuse/Dependencies  Cognitive Features That Contribute To Risk:  Polarized thinking    Suicide Risk:  Minimal: No identifiable suicidal ideation.    Discharge Diagnoses:  Primary Psychiatric Diagnosis: MDD,single episode ,moderate (improving)   Secondary Psychiatric Diagnosis: Cannabis use disorder,severe Alcohol use  disorder,moderate Sedative hypnotic and anxiolytic disorder (xanax) ,mild Opioid use disorder,mild   Non Psychiatric Diagnosis: See pmh   Past Medical History  Diagnosis Date  . No pertinent past medical history   . Headache(784.0)     Plan Of Care/Follow-up recommendations:  Activity:  NO RESTRICTIONS Diet:  REGULAR  Is patient on multiple antipsychotic therapies at discharge:  No   Has Patient had three or more failed trials of antipsychotic monotherapy by history:  No  Recommended Plan for Multiple Antipsychotic Therapies: NA    Leaf Kernodle md 10/10/2014, 8:42 AM
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
## 2014-10-10 NOTE — [facility] Group Notes (Signed)
[facility]   10/10/2014 10:21 AM  Participation Quality:  Invited-DID NOT ATTEND   Smart, Lebron QuamHeather LCSWA
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
## 2014-10-10 NOTE — Progress Notes (Signed)
D: Pt has anxious affect and mood.  Pt reports "I'm ready to go home tomorrow.  They signed me up for treatment classes and stuff."  Pt denies SI/HI, denies hallucinations.  Pt interacts with peers and staff appropriately. A: Medication administered per order.  Pt reported "my face hurts."  Pt was offered PRN medication for pain and pt refused.  Safety maintained.  Encouraged and supported pt.  R: Pt is compliant with scheduled medications.  She verbally contracted for safety.  Will continue to monitor and assess for safety.

## 2014-10-10 NOTE — Plan of Care (Signed)
Problem: Alteration in mood & ability to function due to Goal: LTG-Pt reports reduction in suicidal thoughts (Patient reports reduction in suicidal thoughts and is able to verbalize a safety plan for whenever patient is feeling suicidal)  Outcome: Progressing Pt denied SI this shift.  She verbally contracted for safety.       

## 2014-10-14 NOTE — Progress Notes (Signed)
Patient Discharge Instructions:  After Visit Summary (AVS):   Faxed to:  10/14/14 Discharge Summary Note:   Faxed to:  10/14/14 Psychiatric Admission Assessment Note:   Faxed to:  10/14/14 Suicide Risk Assessment - Discharge Assessment:   Faxed to:  10/14/14 Faxed/Sent to the Next Level Care provider:  10/14/14 Next Level Care Provider Has Access to the EMR, 10/14/14  [facility] @ [phone] Records [facility] to [facility] via CHL/Epic access.   Jerelene ReddenSheena E [place], 10/14/2014, 3:40 PM

## 2015-02-19 ENCOUNTER — Emergency Department (HOSPITAL_COMMUNITY)
Admission: EM | Admit: 2015-02-19 | Discharge: 2015-02-19 | Disposition: A | Discharge status: Home / Self Care | Payer: Medicaid Other | Attending: Emergency Medicine | Admitting: Emergency Medicine

## 2015-02-19 ENCOUNTER — Emergency Department (HOSPITAL_COMMUNITY): Payer: Medicaid Other

## 2015-02-19 ENCOUNTER — Encounter (HOSPITAL_COMMUNITY): Payer: Self-pay | Admitting: Emergency Medicine

## 2015-02-19 DIAGNOSIS — Z720 Tobacco use: Secondary | ICD-10-CM | POA: Diagnosis not present

## 2015-02-19 DIAGNOSIS — S52692B Other fracture of lower end of left ulna, initial encounter for open fracture type I or II: Secondary | ICD-10-CM | POA: Diagnosis not present

## 2015-02-19 DIAGNOSIS — S61502A Unspecified open wound of left wrist, initial encounter: Secondary | ICD-10-CM | POA: Insufficient documentation

## 2015-02-19 DIAGNOSIS — Y999 Unspecified external cause status: Secondary | ICD-10-CM | POA: Diagnosis not present

## 2015-02-19 DIAGNOSIS — Z91040 Latex allergy status: Secondary | ICD-10-CM | POA: Insufficient documentation

## 2015-02-19 DIAGNOSIS — Y939 Activity, unspecified: Secondary | ICD-10-CM | POA: Insufficient documentation

## 2015-02-19 DIAGNOSIS — Y92810 Car as the place of occurrence of the external cause: Secondary | ICD-10-CM | POA: Insufficient documentation

## 2015-02-19 DIAGNOSIS — Z79899 Other long term (current) drug therapy: Secondary | ICD-10-CM | POA: Insufficient documentation

## 2015-02-19 DIAGNOSIS — S52202B Unspecified fracture of shaft of left ulna, initial encounter for open fracture type I or II: Secondary | ICD-10-CM

## 2015-02-19 DIAGNOSIS — W3400XA Accidental discharge from unspecified firearms or gun, initial encounter: Secondary | ICD-10-CM | POA: Diagnosis not present

## 2015-02-19 DIAGNOSIS — S61532A Puncture wound without foreign body of left wrist, initial encounter: Secondary | ICD-10-CM

## 2015-02-19 LAB — COMPREHENSIVE METABOLIC PANEL
ALT: 14 U/L (ref 0–35)
ANION GAP: 11 (ref 5–15)
AST: 25 U/L (ref 0–37)
Albumin: 4.5 g/dL (ref 3.5–5.2)
Alkaline Phosphatase: 63 U/L (ref 39–117)
BILIRUBIN TOTAL: 0.7 mg/dL (ref 0.3–1.2)
BUN: 9 mg/dL (ref 6–23)
CALCIUM: 9.4 mg/dL (ref 8.4–10.5)
CHLORIDE: 103 mmol/L (ref 96–112)
CO2: 23 mmol/L (ref 19–32)
CREATININE: 0.76 mg/dL (ref 0.50–1.10)
Glucose, Bld: 113 mg/dL — ABNORMAL HIGH (ref 70–99)
Potassium: 3.5 mmol/L (ref 3.5–5.1)
Sodium: 137 mmol/L (ref 135–145)
Total Protein: 7.3 g/dL (ref 6.0–8.3)

## 2015-02-19 LAB — I-STAT CG4 LACTIC ACID, ED: LACTIC ACID, VENOUS: 2.1 mmol/L — AB (ref 0.5–2.0)

## 2015-02-19 LAB — CDS SEROLOGY

## 2015-02-19 LAB — CBC WITH DIFFERENTIAL/PLATELET
BASOS PCT: 0 % (ref 0–1)
Basophils Absolute: 0 10*3/uL (ref 0.0–0.1)
EOS ABS: 0 10*3/uL (ref 0.0–0.7)
Eosinophils Relative: 0 % (ref 0–5)
HEMATOCRIT: 40.9 % (ref 36.0–46.0)
Hemoglobin: 13.8 g/dL (ref 12.0–15.0)
LYMPHS ABS: 3.1 10*3/uL (ref 0.7–4.0)
Lymphocytes Relative: 27 % (ref 12–46)
MCH: 28.2 pg (ref 26.0–34.0)
MCHC: 33.7 g/dL (ref 30.0–36.0)
MCV: 83.6 fL (ref 78.0–100.0)
MONO ABS: 0.6 10*3/uL (ref 0.1–1.0)
Monocytes Relative: 5 % (ref 3–12)
NEUTROS ABS: 7.8 10*3/uL — AB (ref 1.7–7.7)
Neutrophils Relative %: 68 % (ref 43–77)
Platelets: 213 10*3/uL (ref 150–400)
RBC: 4.89 MIL/uL (ref 3.87–5.11)
RDW: 13.9 % (ref 11.5–15.5)
WBC: 11.5 10*3/uL — AB (ref 4.0–10.5)

## 2015-02-19 LAB — ETHANOL

## 2015-02-19 LAB — SAMPLE TO BLOOD BANK

## 2015-02-19 MED ORDER — MORPHINE SULFATE 4 MG/ML IJ SOLN
4.0000 mg | Freq: Once | INTRAMUSCULAR | Status: AC
  Administered 2015-02-19: 4 mg via INTRAVENOUS
  Filled 2015-02-19: qty 1

## 2015-02-19 MED ORDER — SODIUM CHLORIDE 0.9 % IV SOLN
INTRAVENOUS | Status: AC | PRN
  Administered 2015-02-19: 125 mL/h via INTRAVENOUS
  Administered 2015-02-19: 1000 mL via INTRAVENOUS

## 2015-02-19 MED ORDER — OXYCODONE-ACETAMINOPHEN 5-325 MG PO TABS: 1.0000 | ORAL_TABLET | ORAL | Status: DC | PRN

## 2015-02-19 MED ORDER — MORPHINE SULFATE 2 MG/ML IJ SOLN
INTRAMUSCULAR | Status: DC
Start: 2015-02-19 — End: 2015-02-19
  Filled 2015-02-19: qty 2

## 2015-02-19 MED ORDER — MORPHINE SULFATE 4 MG/ML IJ SOLN
4.0000 mg | Freq: Once | INTRAMUSCULAR | Status: AC
  Administered 2015-02-19: 4 mg via INTRAVENOUS

## 2015-02-19 NOTE — ED Notes (Signed)
Patient returned from xray.

## 2015-02-19 NOTE — ED Notes (Signed)
Patient to xray.
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
## 2015-02-19 NOTE — Progress Notes (Signed)
Orthopedic Tech Progress Note Patient Details:  Angela RavelingDebra L Henson 06/24/1991 [id number]  Ortho Devices Type of Ortho Device: Volar splint Ortho Device/Splint Interventions: Application   Angela Henson, Angela Henson 02/19/2015, 4:31 AM
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
## 2015-03-01 ENCOUNTER — Emergency Department (HOSPITAL_COMMUNITY)
Admission: EM | Admit: 2015-03-01 | Discharge: 2015-03-01 | Disposition: A | Discharge status: Home / Self Care | Payer: Medicaid Other | Attending: Emergency Medicine | Admitting: Emergency Medicine

## 2015-03-01 ENCOUNTER — Encounter (HOSPITAL_COMMUNITY): Payer: Self-pay | Admitting: *Deleted

## 2015-03-01 DIAGNOSIS — G479 Sleep disorder, unspecified: Secondary | ICD-10-CM | POA: Diagnosis present

## 2015-03-01 DIAGNOSIS — Z792 Long term (current) use of antibiotics: Secondary | ICD-10-CM | POA: Diagnosis not present

## 2015-03-01 DIAGNOSIS — F4310 Post-traumatic stress disorder, unspecified: Secondary | ICD-10-CM | POA: Diagnosis not present

## 2015-03-01 DIAGNOSIS — Z720 Tobacco use: Secondary | ICD-10-CM | POA: Insufficient documentation

## 2015-03-01 DIAGNOSIS — Z91040 Latex allergy status: Secondary | ICD-10-CM | POA: Diagnosis not present

## 2015-03-01 DIAGNOSIS — Z79899 Other long term (current) drug therapy: Secondary | ICD-10-CM | POA: Insufficient documentation

## 2015-03-01 DIAGNOSIS — F419 Anxiety disorder, unspecified: Secondary | ICD-10-CM | POA: Insufficient documentation

## 2015-03-01 DIAGNOSIS — F411 Generalized anxiety disorder: Secondary | ICD-10-CM

## 2015-03-01 MED ORDER — LORAZEPAM 1 MG PO TABS: 1.0000 mg | ORAL_TABLET | Freq: Three times a day (TID) | ORAL | Status: DC | PRN

## 2015-03-01 MED ORDER — LORAZEPAM 1 MG PO TABS
1.0000 mg | ORAL_TABLET | Freq: Once | ORAL | Status: AC
  Administered 2015-03-01: 1 mg via ORAL
  Filled 2015-03-01: qty 1
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
## 2015-03-01 NOTE — Discharge Instructions (Signed)
Posttraumatic Stress Disorder Posttraumatic stress disorder (PTSD) is a mental disorder. It occurs after a traumatic event in your life. The traumatic events that cause PTSD are outside the range of normal human experience. Examples of these events include war, automobile accidents, natural disasters, rape, domestic violence, and violent crimes. Most people who experience these types of events are able to heal on their own. Those who do not heal develop PTSD. PTSD can happen to anyone at any age. However, people with a history of childhood abuse are at increased risk for developing PTSD.  SYMPTOMS  The traumatic event that causes PTSD must be a threat to life, cause serious injury, or involve sexual violence. The traumatic event is usually experienced directly by the person who develops PTSD. Sometimes PTSD occurs in people who witness traumas that occur to others or who hear about a trauma that occurs to a close family member or friend. The following behaviors are characteristic of people with PTSD:  People with PTSD re-experience the traumatic event in one or more of the following ways (intrusion symptoms):  Recurrent, unwanted distressing memories while awake.  Recurrent distressing dreams.  Sensations similar to those felt when the event originally occurred (flashbacks).   Intense or prolonged emotional distress, triggered by reminders of the trauma. This may include fear, horror, intense sadness, or anger.  Marked physical reactions, triggered by reminders of the trauma. This may include racing heart, shortness of breath, sweating, and shaking.  People with PTSD avoid thoughts, conversations, people, or activities that remind them of the traumatic event (avoidance symptoms).  People with PTSD have negative changes in their thinking and mood after the traumatic event. These changes include:  Inability to remember one or more significant aspects of the traumatic event (memory  gaps).  Exaggerated negative perceptions about themselves or others, such as believing that they are bad people or that no one can be trusted.  Unrealistic assignment of blame to themselves or others for the traumatic event.  Persistent negative emotional state, such as fear, horror, anger, sadness, guilt, or shame.  Markedly decreased interest or participation in significant activities.  A loss of connection with other people.  Inability to experience positive emotions, such as happiness or love.  People with PTSD are more sensitive to their environment and react more easily than others (hyperarousal-overreactivity symptoms). These symptoms include:  Irritability, with angry outbursts toward other people or objects. The outbursts are easily triggered and may be verbal or physical.  Careless or self-destructive behavior. This may include reckless driving or drug use.  A feeling of being on edge, with increased alertness (hypervigilance).  Exaggerated reactions to stimuli, such as being easily startled.   Difficulty concentrating.  Difficulty sleeping. PTSD symptoms may start soon after a frightening event or months or years later. They last at least 1 month or longer and can affect one or more areas of functioning, such as social or occupational functioning.  DIAGNOSIS  PTSD is diagnosed through an assessment by a mental health professional. You will be asked questions about the traumatic events in your life. You will also be asked about how these events have changed your thoughts, mood, behavior, and ability to function on a daily basis. You may be asked about your use of alcohol or drugs, which can make PTSD symptoms worse. TREATMENT  Unlike many mental disorders, which require lifelong management, PTSD is a curable condition. The goal of PTSD treatment is to neutralize the negative effects of the traumatic event on daily   functioning, not erase the memory of the event. The  following treatments may be prescribed to reach this goal:  Medicines. Certain medicines can reduce some PTSD symptoms. Intrusion symptoms and hyperarousal-overactivity symptoms respond best to medicines.  Counseling (talk therapy). Talk therapy with a mental health professional who is experienced in treating PTSD can help. Talk therapy can provide education, emotional support, and coping skills. Certain types of talk therapy that specifically target the traumatic events are the most effective treatment for PTSD:  Prolonged exposure therapy, which involves remembering and processing the traumatic event with a therapist in a safe environment until it no longer creates a negative emotional response.  Eye movement desensitization and reprocessing therapy, which involves the use of repetitive physical stimulation of the senses that alternates between the right and left sides of the body. It is believed that this therapy facilitates communication between the two sides of the brain. This communication helps the mind to integrate the fragmented memories of the traumatic event into a whole story that makes sense and no longer creates a negative emotional response. Most people with PTSD benefit from a combination of these treatments.  Document Released: 07/05/2001 Document Revised: 02/24/2014 Document Reviewed: 12/27/2012 [facility] Patient Information 2015 [place], [place]. This information is not intended to replace advice given to you by your health care provider. Make sure you discuss any questions you have with your health care provider.  Lorazepam tablets What is this medicine? LORAZEPAM (lor A ze pam) is a benzodiazepine. It is used to treat anxiety. This medicine may be used for other purposes; ask your health care provider or pharmacist if you have questions. COMMON BRAND NAME(S): Ativan What should I tell my health care provider before I take this medicine? They need to know if you have any of these  conditions: -alcohol or drug abuse problem -bipolar disorder, depression, psychosis or other mental health condition -glaucoma -kidney or liver disease -lung disease or breathing difficulties -myasthenia gravis -Parkinson's disease -seizures or a history of seizures -suicidal thoughts -an unusual or allergic reaction to lorazepam, other benzodiazepines, foods, dyes, or preservatives -pregnant or trying to get pregnant -breast-feeding How should I use this medicine? Take this medicine by mouth with a glass of water. Follow the directions on the prescription label. If it upsets your stomach, take it with food or milk. Take your medicine at regular intervals. Do not take it more often than directed. Do not stop taking except on the advice of your doctor or health care professional. Talk to your pediatrician regarding the use of this medicine in children. Special care may be needed. Overdosage: If you think you have taken too much of this medicine contact a poison control center or emergency room at once. NOTE: This medicine is only for you. Do not share this medicine with others. What if I miss a dose? If you miss a dose, take it as soon as you can. If it is almost time for your next dose, take only that dose. Do not take double or extra doses. What may interact with this medicine? -barbiturate medicines for inducing sleep or treating seizures, like phenobarbital -clozapine -medicines for depression, mental problems or psychiatric disturbances -medicines for sleep -phenytoin -probenecid -theophylline -valproic acid This list may not describe all possible interactions. Give your health care provider a list of all the medicines, herbs, non-prescription drugs, or dietary supplements you use. Also tell them if you smoke, drink alcohol, or use illegal drugs. Some items may interact with your medicine. What  should I watch for while using this medicine? Visit your doctor or health care  professional for regular checks on your progress. Your body may become dependent on this medicine, ask your doctor or health care professional if you still need to take it. However, if you have been taking this medicine regularly for some time, do not suddenly stop taking it. You must gradually reduce the dose or you may get severe side effects. Ask your doctor or health care professional for advice before increasing or decreasing the dose. Even after you stop taking this medicine it can still affect your body for several days. You may get drowsy or dizzy. Do not drive, use machinery, or do anything that needs mental alertness until you know how this medicine affects you. To reduce the risk of dizzy and fainting spells, do not stand or sit up quickly, especially if you are an older patient. Alcohol may increase dizziness and drowsiness. Avoid alcoholic drinks. Do not treat yourself for coughs, colds or allergies without asking your doctor or health care professional for advice. Some ingredients can increase possible side effects. What side effects may I notice from receiving this medicine? Side effects that you should report to your doctor or health care professional as soon as possible: -changes in vision -confusion -depression -mood changes, excitability or aggressive behavior -movement difficulty, staggering or jerky movements -muscle cramps -restlessness -weakness or tiredness Side effects that usually do not require medical attention (report to your doctor or health care professional if they continue or are bothersome): -constipation or diarrhea -difficulty sleeping, nightmares -dizziness, drowsiness -headache -nausea, vomiting This list may not describe all possible side effects. Call your doctor for medical advice about side effects. You may report side effects to FDA at 1-800-FDA-1088. Where should I keep my medicine? Keep out of the reach of children. This medicine can be abused. Keep  your medicine in a safe place to protect it from theft. Do not share this medicine with anyone. Selling or giving away this medicine is dangerous and against the law. Store at room temperature between 20 and 25 degrees C (68 and 77 degrees F). Protect from light. Keep container tightly closed. Throw away any unused medicine after the expiration date. NOTE: This sheet is a summary. It may not cover all possible information. If you have questions about this medicine, talk to your doctor, pharmacist, or health care provider.  2015, Elsevier/Gold Standard. (2008-04-11 14:58:20)   Emergency Department Resource Guide   Behavioral Health Resources in the Community: Intensive Outpatient Programs Organization         Address  Phone  Notes  [facility] [address], [place], [place] [phone]   [facility] [address], [place], [place] [phone]   [facility] [address], [place], [place]  [phone]   [facility] [address],  [place], [place] [phone] or [phone]   Substance Abuse Resources Organization         Address  Phone  Notes  [facility]  [phone]   [facility]  [phone]   [facility]  [phone]   [facility]  [phone]   [facility] Abuse [facility]  [phone]   Psychological Services Organization         Address  Phone  Notes  [facility]  [phone]   [facility]  [phone]- [phone]   [facility] [phone]  [address], [place] [phone] or [phone]
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
## 2015-07-22 ENCOUNTER — Encounter (HOSPITAL_COMMUNITY): Payer: Self-pay | Admitting: *Deleted

## 2015-07-22 ENCOUNTER — Inpatient Hospital Stay (HOSPITAL_COMMUNITY)
Admission: AD | Admit: 2015-07-22 | Discharge: 2015-07-22 | Disposition: A | Discharge status: Home / Self Care | Payer: Medicaid Other | Source: Ambulatory Visit | Attending: Obstetrics & Gynecology | Admitting: Obstetrics & Gynecology

## 2015-07-22 DIAGNOSIS — Z3201 Encounter for pregnancy test, result positive: Secondary | ICD-10-CM | POA: Diagnosis present

## 2015-07-22 LAB — POCT PREGNANCY, URINE
Preg Test, Ur: POSITIVE — AB
Preg Test, Ur: POSITIVE — AB

## 2015-07-22 MED ORDER — PRENATAL VITAMINS PLUS 27-1 MG PO TABS: 1.0000 | ORAL_TABLET | Freq: Every day | ORAL | Status: DC

## 2015-07-22 NOTE — MAU Note (Signed)
Pt presents to MAU for pregnancy test. Denies any vaginal bleeding or pain. 

## 2015-07-22 NOTE — Discharge Instructions (Signed)
Prenatal Care  WHAT IS PRENATAL CARE?  Prenatal care means health care during your pregnancy, before your baby is born. It is very important to take care of yourself and your baby during your pregnancy by:   Getting early prenatal care. If you know you are pregnant, or think you might be pregnant, call your health care provider as soon as possible. Schedule a visit for a prenatal exam.  Getting regular prenatal care. Follow your health care provider's schedule for blood and other necessary tests. Do not miss appointments.  Doing everything you can to keep yourself and your baby healthy during your pregnancy.  Getting complete care. Prenatal care should include evaluation of the medical, dietary, educational, psychological, and social needs of you and your significant other. The medical and genetic history of your family and the family of your baby's father should be discussed with your health care provider.  Discussing with your health care provider:  Prescription, over-the-counter, and herbal medicines that you take.  Any history of substance abuse, alcohol use, smoking, and illegal drug use.  Any history of domestic abuse and violence.  Immunizations you have received.  Your nutrition and diet.  The amount of exercise you do.  Any environmental and occupational hazards to which you are exposed.  History of sexually transmitted infections for both you and your partner.  Previous pregnancies you have had. WHY IS PRENATAL CARE SO IMPORTANT?  By regularly seeing your health care provider, you help ensure that problems can be identified early so that they can be treated as soon as possible. Other problems might be prevented. Many studies have shown that early and regular prenatal care is important for the health of mothers and their babies.  HOW CAN I TAKE CARE OF MYSELF WHILE I AM PREGNANT?  Here are ways to take care of yourself and your baby:   Start or continue taking your  multivitamin with 400 micrograms (mcg) of folic acid every day.  Get early and regular prenatal care. It is very important to see a health care provider during your pregnancy. Your health care provider will check at each visit to make sure that you and your baby are healthy. If there are any problems, action can be taken right away to help you and your baby.  Eat a healthy diet that includes:  Fruits.  Vegetables.  Foods low in saturated fat.  Whole grains.  Calcium-rich foods, such as milk, yogurt, and hard cheeses.  Drink 6-8 glasses of liquids a day.  Unless your health care provider tells you not to, try to be physically active for 30 minutes, most days of the week. If you are pressed for time, you can get your activity in through 10-minute segments, three times a day.  Do not smoke, drink alcohol, or use drugs. These can cause long-term damage to your baby. Talk with your health care provider about steps to take to stop smoking. Talk with a member of your faith community, a counselor, a trusted friend, or your health care provider if you are concerned about your alcohol or drug use.  Ask your health care provider before taking any medicine, even over-the-counter medicines. Some medicines are not safe to take during pregnancy.  Get plenty of rest and sleep.  Avoid hot tubs and saunas during pregnancy.  Do not have X-rays taken unless absolutely necessary and with the recommendation of your health care provider. A lead shield can be placed on your abdomen to protect your baby when   X-rays are taken in other parts of your body.  Do not empty the cat litter when you are pregnant. It may contain a parasite that causes an infection called toxoplasmosis, which can cause birth defects. Also, use gloves when working in garden areas used by cats.  Do not eat uncooked or undercooked meats or fish.  Do not eat soft, mold-ripened cheeses (Brie, Camembert, and chevre) or soft, blue-veined  cheese (Danish blue and Roquefort).  Stay away from toxic chemicals like:  Insecticides.  Solvents (some cleaners or paint thinners).  Lead.  Mercury.  Sexual intercourse may continue until the end of the pregnancy, unless you have a medical problem or there is a problem with the pregnancy and your health care provider tells you not to.  Do not wear high-heel shoes, especially during the second half of the pregnancy. You can lose your balance and fall.  Do not take long trips, unless absolutely necessary. Be sure to see your health care provider before going on the trip.  Do not sit in one position for more than 2 hours when on a trip.  Take a copy of your medical records when going on a trip. Know where a hospital is located in the city you are visiting, in case of an emergency.  Most dangerous household products will have pregnancy warnings on their labels. Ask your health care provider about products if you are unsure.  Limit or eliminate your caffeine intake from coffee, tea, sodas, medicines, and chocolate.  Many women continue working through pregnancy. Staying active might help you stay healthier. If you have a question about the safety or the hours you work at your particular job, talk with your health care provider.  Get informed:  Read books.  Watch videos.  Go to childbirth classes for you and your significant other.  Talk with experienced moms.  Ask your health care provider about childbirth education classes for you and your partner. Classes can help you and your partner prepare for the birth of your baby.  Ask about a baby doctor (pediatrician) and methods and pain medicine for labor, delivery, and possible cesarean delivery. HOW OFTEN SHOULD I SEE MY HEALTH CARE PROVIDER DURING PREGNANCY?  Your health care provider will give you a schedule for your prenatal visits. You will have visits more often as you get closer to the end of your pregnancy. An average  pregnancy lasts about 40 weeks.  A typical schedule includes visiting your health care provider:   About once each month during your first 6 months of pregnancy.  Every 2 weeks during the next 2 months.  Weekly in the last month, until the delivery date. Your health care provider will probably want to see you more often if:  You are older than 35 years.  Your pregnancy is high risk because you have certain health problems or problems with the pregnancy, such as:  Diabetes.  High blood pressure.  The baby is not growing on schedule, according to the dates of the pregnancy. Your health care provider will do special tests to make sure you and your baby are not having any serious problems. WHAT HAPPENS DURING PRENATAL VISITS?   At your first prenatal visit, your health care provider will do a physical exam and talk to you about your health history and the health history of your partner and your family. Your health care provider will be able to tell you what date to expect your baby to be born on.  Your   first physical exam will include checks of your blood pressure, measurements of your height and weight, and an exam of your pelvic organs. Your health care provider will do a Pap test if you have not had one recently and will do cultures of your cervix to make sure there is no infection.  At each prenatal visit, there will be tests of your blood, urine, blood pressure, weight, and the progress of the baby will be checked.  At your later prenatal visits, your health care provider will check how you are doing and how your baby is developing. You may have a number of tests done as your pregnancy progresses.  Ultrasound exams are often used to check on your baby's growth and health.  You may have more urine and blood tests, as well as special tests, if needed. These may include amniocentesis to examine fluid in the pregnancy sac, stress tests to check how the baby responds to contractions, or a  biophysical profile to measure your baby's well-being. Your health care provider will explain the tests and why they are necessary.  You should be tested for high blood sugar (gestational diabetes) between the 24th and 28th weeks of your pregnancy.  You should discuss with your health care provider your plans to breastfeed or bottle-feed your baby.  Each visit is also a chance for you to learn about staying healthy during pregnancy and to ask questions. Document Released: 10/13/2003 Document Revised: 10/15/2013 Document Reviewed: 12/25/2013 Exit[employer] Patient Information 2015 [employer]. This information is not intended to replace advice given to you by your health care provider. Make sure you discuss any questions you have with your health care provider.  

## 2015-07-22 NOTE — MAU Provider Note (Signed)
History     CSN: [id number]  Arrival date and time: 07/22/15 1459   First Provider Initiated Contact with Patient 07/22/15 1551      Chief Complaint  Patient presents with  . Possible Pregnancy   HPI KAYREN HOLCK 24 y.o. [id number]  presents to MAU for pregnancy test only.  She reports no problems whatsoever.  She denies vaginal bleeding, abdominal pain, dysuria, fever.   OB History    Gravida Para Term Preterm AB TAB SAB Ectopic Multiple Living        Past Medical History  Diagnosis Date  . No pertinent past medical history   . FAOZHYQM(578.4)     Past Surgical History  Procedure Laterality Date  . Wisdom tooth extraction    . Incision and drainage abscess anal  2013    I&D of right buttock  . Cesarean section  11/03/2012    Procedure: CESAREAN SECTION;  Surgeon: Bing Plume, MD;  Location: [facility];  Service: Obstetrics;  Laterality: N/A;  Primary cesarean section with delivery of baby boy at 29. Apgars 8/9.    Family History  Problem Relation Age of Onset  . Hypotension Neg Hx   . Mental illness Other     Social History  Substance Use Topics  . Smoking status: Current Every Day Smoker -- 0.50 packs/day for 8 years    Types: Cigarettes  . Smokeless tobacco: Not on file  . Alcohol Use: Yes    Allergies:  Allergies  Allergen Reactions  . No Known Allergies   . Latex Rash    Yeast infection    Prescriptions prior to admission  Medication Sig Dispense Refill Last Dose  . hydrOXYzine (ATARAX/VISTARIL) 25 MG tablet Take 1 tablet (25 mg total) by mouth every 6 (six) hours as needed for anxiety (or CIWA score </= 10). 30 tablet 0   . ibuprofen (ADVIL,MOTRIN) 400 MG tablet Take 1 tablet (400 mg total) by mouth every 8 (eight) hours as needed for fever, headache or moderate pain. 15 tablet 0   . LORazepam (ATIVAN) 1 MG tablet Take 1 tablet (1 mg total) by mouth 3 (three) times daily as needed for anxiety. 15 tablet 0   . metroNIDAZOLE  (FLAGYL) 250 MG tablet Take 1 tablet (250 mg total) by mouth every 8 (eight) hours. 21 tablet 0   . oxyCODONE-acetaminophen (PERCOCET) 5-325 MG per tablet Take 1 tablet by mouth every 4 (four) hours as needed for moderate pain. 20 tablet 0   . sertraline (ZOLOFT) 25 MG tablet Take 1 tablet (25 mg total) by mouth daily. 30 tablet 0   . traZODone (DESYREL) 50 MG tablet Take 1 tablet (50 mg total) by mouth at bedtime and may repeat dose one time if needed. 50 tablet 0     ROS Pertinent ROS in HPI.  All other systems are negative.   Physical Exam   Blood pressure 106/53, pulse 105, temperature 98.2 F (36.8 C), resp. rate 18, last menstrual period 06/20/2015.  Physical Exam  Constitutional: She is oriented to person, place, and time. She appears well-developed and well-nourished. No distress.  HENT:  Head: Normocephalic and atraumatic.  Eyes: EOM are normal.  Neck: Normal range of motion.  Cardiovascular: Normal rate.   Respiratory: No respiratory distress.  Neurological: She is alert and oriented to person, place, and time.  Skin: Skin is warm and dry.  Psychiatric: She has a normal mood and affect.  Results for orders placed or performed during the hospital encounter of 07/22/15 (from the past 24 hour(s))  Pregnancy, urine POC     Status: Abnormal   Collection Time: 07/22/15  3:34 PM  Result Value Ref Range   Preg Test, Ur POSITIVE (A) NEGATIVE    MAU Course  Procedures  MDM Positive pregnancy test No problems  Assessment and Plan  A: Positive pregnancy test - first trim  P: Discharge to home  Daily PNV Obtain [facility] asap - resources given Letter for pregnancy verification provided Patient may return to MAU as needed or if her condition were to change or worsen   Bertram Denver 07/22/2015, 3:52 PM

## 2015-08-17 ENCOUNTER — Encounter (HOSPITAL_COMMUNITY): Payer: Self-pay | Admitting: *Deleted

## 2015-08-17 ENCOUNTER — Inpatient Hospital Stay (HOSPITAL_COMMUNITY)
Admission: EM | Admit: 2015-08-17 | Discharge: 2015-08-17 | Disposition: A | Discharge status: Home / Self Care | Payer: Medicaid Other | Source: Ambulatory Visit | Attending: Family Medicine | Admitting: Family Medicine

## 2015-08-17 DIAGNOSIS — F17210 Nicotine dependence, cigarettes, uncomplicated: Secondary | ICD-10-CM | POA: Insufficient documentation

## 2015-08-17 DIAGNOSIS — Z3A08 8 weeks gestation of pregnancy: Secondary | ICD-10-CM | POA: Diagnosis not present

## 2015-08-17 DIAGNOSIS — O219 Vomiting of pregnancy, unspecified: Secondary | ICD-10-CM | POA: Diagnosis not present

## 2015-08-17 DIAGNOSIS — O218 Other vomiting complicating pregnancy: Secondary | ICD-10-CM | POA: Diagnosis not present

## 2015-08-17 DIAGNOSIS — R112 Nausea with vomiting, unspecified: Secondary | ICD-10-CM | POA: Diagnosis present

## 2015-08-17 LAB — HCG, QUANTITATIVE, PREGNANCY: HCG, BETA CHAIN, QUANT, S: 192430 m[IU]/mL — AB (ref <5)

## 2015-08-17 LAB — URINALYSIS, ROUTINE W REFLEX MICROSCOPIC
Bilirubin Urine: NEGATIVE
Glucose, UA: NEGATIVE mg/dL
Hgb urine dipstick: NEGATIVE
Ketones, ur: 15 mg/dL — AB
LEUKOCYTES UA: NEGATIVE
Nitrite: NEGATIVE
PROTEIN: NEGATIVE mg/dL
Specific Gravity, Urine: 1.02 (ref 1.005–1.030)
Urobilinogen, UA: 0.2 mg/dL (ref 0.0–1.0)
pH: 6.5 (ref 5.0–8.0)

## 2015-08-17 LAB — COMPREHENSIVE METABOLIC PANEL
ALT: 12 U/L — AB (ref 14–54)
AST: 17 U/L (ref 15–41)
Albumin: 4.3 g/dL (ref 3.5–5.0)
Alkaline Phosphatase: 50 U/L (ref 38–126)
Anion gap: 4 — ABNORMAL LOW (ref 5–15)
BUN: 11 mg/dL (ref 6–20)
CHLORIDE: 107 mmol/L (ref 101–111)
CO2: 23 mmol/L (ref 22–32)
CREATININE: 0.51 mg/dL (ref 0.44–1.00)
Calcium: 9.1 mg/dL (ref 8.9–10.3)
GFR calc Af Amer: >60 mL/min (ref >60)
GFR calc non Af Amer: >60 mL/min (ref >60)
Glucose, Bld: 78 mg/dL (ref 65–99)
POTASSIUM: 4.2 mmol/L (ref 3.5–5.1)
SODIUM: 134 mmol/L — AB (ref 135–145)
Total Bilirubin: 0.5 mg/dL (ref 0.3–1.2)
Total Protein: 7.5 g/dL (ref 6.5–8.1)

## 2015-08-17 MED ORDER — ONDANSETRON 4 MG PO TBDP: 4.0000 mg | ORAL_TABLET | Freq: Three times a day (TID) | ORAL | Status: DC | PRN

## 2015-08-17 MED ORDER — ONDANSETRON 4 MG PO TBDP
4.0000 mg | ORAL_TABLET | Freq: Two times a day (BID) | ORAL | Status: DC
  Administered 2015-08-17: 4 mg via ORAL
  Filled 2015-08-17: qty 1

## 2015-08-17 NOTE — Discharge Instructions (Signed)

## 2015-08-17 NOTE — MAU Provider Note (Signed)
History   G2P1 at 8.2 wks in with c/o mild cramping, denies vag bleeding, and states main complaint is bad nausea and vomiting. Presently no in care waiting on medicaid.  CSN: [id number]  Arrival date and time: 08/17/15 1515   None     No chief complaint on file.  HPI  OB History    Gravida Para Term Preterm AB TAB SAB Ectopic Multiple Living   2 1 0 1 0 0 0 0 0 1       Past Medical History  Diagnosis Date  . No pertinent past medical history   . WUJWJXBJ(478.2Headache(784.0)     Past Surgical History  Procedure Laterality Date  . Wisdom tooth extraction    . Incision and drainage abscess anal  2013    I&D of right buttock  . Cesarean section  11/03/2012    Procedure: CESAREAN SECTION;  Surgeon: Bing Plumehomas F Henley, MD;  Location: [facility];  Service: Obstetrics;  Laterality: N/A;  Primary cesarean section with delivery of baby boy at [id number]. Apgars 8/9.    Family History  Problem Relation Age of Onset  . Hypotension Neg Hx   . Mental illness Other     Social History  Substance Use Topics  . Smoking status: Current Every Day Smoker -- 0.50 packs/day for 8 years    Types: Cigarettes  . Smokeless tobacco: Not on file  . Alcohol Use: Yes    Allergies:  Allergies  Allergen Reactions  . No Known Allergies   . Latex Rash    Yeast infection    Prescriptions prior to admission  Medication Sig Dispense Refill Last Dose  . hydrOXYzine (ATARAX/VISTARIL) 25 MG tablet Take 1 tablet (25 mg total) by mouth every 6 (six) hours as needed for anxiety (or CIWA score </= 10). 30 tablet 0   . ibuprofen (ADVIL,MOTRIN) 400 MG tablet Take 1 tablet (400 mg total) by mouth every 8 (eight) hours as needed for fever, headache or moderate pain. 15 tablet 0   . LORazepam (ATIVAN) 1 MG tablet Take 1 tablet (1 mg total) by mouth 3 (three) times daily as needed for anxiety. 15 tablet 0   . metroNIDAZOLE (FLAGYL) 250 MG tablet Take 1 tablet (250 mg total) by mouth every 8 (eight) hours. 21 tablet 0   .  oxyCODONE-acetaminophen (PERCOCET) 5-325 MG per tablet Take 1 tablet by mouth every 4 (four) hours as needed for moderate pain. 20 tablet 0   . Prenatal Vit-Fe Fumarate-FA (PRENATAL VITAMINS PLUS) 27-1 MG TABS Take 1 tablet by mouth daily. 30 tablet 11   . sertraline (ZOLOFT) 25 MG tablet Take 1 tablet (25 mg total) by mouth daily. 30 tablet 0   . traZODone (DESYREL) 50 MG tablet Take 1 tablet (50 mg total) by mouth at bedtime and may repeat dose one time if needed. 50 tablet 0     Review of Systems  Constitutional: Negative.   HENT: Negative.   Eyes: Negative.   Cardiovascular: Negative.   Gastrointestinal: Positive for nausea, vomiting and abdominal pain.  Genitourinary: Negative.   Musculoskeletal: Negative.   Skin: Negative.   Neurological: Negative.   Endo/Heme/Allergies: Negative.   Psychiatric/Behavioral: Negative.    Physical Exam   Last menstrual period 06/20/2015.  Physical Exam  Constitutional: She is oriented to person, place, and time. She appears well-developed and well-nourished.  HENT:  Head: Normocephalic.  Eyes: Pupils are equal, round, and reactive to light.  Neck: Normal range of motion.  Cardiovascular: Normal rate, regular  rhythm, normal heart sounds and intact distal pulses.   Respiratory: Effort normal and breath sounds normal.  GI: Soft. Bowel sounds are normal.  Genitourinary: Vagina normal and uterus normal.  Musculoskeletal: Normal range of motion.  Neurological: She is alert and oriented to person, place, and time. She has normal reflexes.  Skin: Skin is warm and dry.  Psychiatric: She has a normal mood and affect. Her behavior is normal. Judgment and thought content normal.    MAU Course  Procedures  MDM Nausea and vomiting of early pregnancy  Assessment and Plan  Assess labs, zofran for nausea. Pt states med working well. Wants to go home.  Wyvonnia Dusky DARLENE 08/17/2015, 4:04 PM

## 2015-08-17 NOTE — MAU Note (Addendum)
Pt presents to MAU with complaints of lower abdominal pain from vomiting. Denies any vaginal bleeding

## 2015-08-24 ENCOUNTER — Inpatient Hospital Stay (HOSPITAL_COMMUNITY)
Admission: AD | Admit: 2015-08-24 | Discharge: 2015-08-25 | Disposition: A | Discharge status: Home / Self Care | Payer: Medicaid Other | Source: Ambulatory Visit | Attending: Obstetrics and Gynecology | Admitting: Obstetrics and Gynecology

## 2015-08-24 ENCOUNTER — Encounter (HOSPITAL_COMMUNITY): Payer: Self-pay | Admitting: *Deleted

## 2015-08-24 ENCOUNTER — Inpatient Hospital Stay (HOSPITAL_COMMUNITY): Payer: Medicaid Other

## 2015-08-24 DIAGNOSIS — F17210 Nicotine dependence, cigarettes, uncomplicated: Secondary | ICD-10-CM | POA: Diagnosis not present

## 2015-08-24 DIAGNOSIS — O99331 Smoking (tobacco) complicating pregnancy, first trimester: Secondary | ICD-10-CM | POA: Diagnosis not present

## 2015-08-24 DIAGNOSIS — Z3A09 9 weeks gestation of pregnancy: Secondary | ICD-10-CM | POA: Insufficient documentation

## 2015-08-24 DIAGNOSIS — R109 Unspecified abdominal pain: Secondary | ICD-10-CM | POA: Diagnosis not present

## 2015-08-24 DIAGNOSIS — O210 Mild hyperemesis gravidarum: Secondary | ICD-10-CM | POA: Diagnosis not present

## 2015-08-24 DIAGNOSIS — O26899 Other specified pregnancy related conditions, unspecified trimester: Secondary | ICD-10-CM

## 2015-08-24 DIAGNOSIS — K5900 Constipation, unspecified: Secondary | ICD-10-CM | POA: Diagnosis not present

## 2015-08-24 DIAGNOSIS — O9989 Other specified diseases and conditions complicating pregnancy, childbirth and the puerperium: Secondary | ICD-10-CM | POA: Diagnosis not present

## 2015-08-24 LAB — URINALYSIS, ROUTINE W REFLEX MICROSCOPIC
BILIRUBIN URINE: NEGATIVE
Glucose, UA: NEGATIVE mg/dL
Hgb urine dipstick: NEGATIVE
KETONES UR: NEGATIVE mg/dL
Leukocytes, UA: NEGATIVE
NITRITE: NEGATIVE
PROTEIN: NEGATIVE mg/dL
Specific Gravity, Urine: 1.025 (ref 1.005–1.030)
UROBILINOGEN UA: 0.2 mg/dL (ref 0.0–1.0)
pH: 6 (ref 5.0–8.0)

## 2015-08-24 MED ORDER — MECLIZINE HCL 25 MG PO TABS
25.0000 mg | ORAL_TABLET | Freq: Once | ORAL | Status: AC
  Administered 2015-08-24: 25 mg via ORAL
  Filled 2015-08-24: qty 1

## 2015-08-24 MED ORDER — DOCUSATE SODIUM 100 MG PO CAPS: 100.0000 mg | ORAL_CAPSULE | Freq: Two times a day (BID) | ORAL | Status: DC

## 2015-08-24 MED ORDER — PROMETHAZINE HCL 25 MG PO TABS
12.5000 mg | ORAL_TABLET | Freq: Once | ORAL | Status: AC
  Administered 2015-08-24: 25 mg via ORAL
  Filled 2015-08-24: qty 1

## 2015-08-24 MED ORDER — PROMETHAZINE HCL 25 MG PO TABS: 25.0000 mg | ORAL_TABLET | Freq: Four times a day (QID) | ORAL | Status: DC | PRN

## 2015-08-24 NOTE — MAU Note (Signed)
Pt c/o lower abdominal pain all day-dull and achy-rates 6/10. Denies vaginal bleeding or discharge. States she has also felt dizzy all day. Has nausea and some vomiting-nothing different than what she has been feeling.

## 2015-08-24 NOTE — MAU Provider Note (Signed)
History     CSN: [id number]  Arrival date and time: 08/24/15 2055   None     Chief Complaint  Patient presents with  . Abdominal Pain  . Dizziness   HPI  Pt is 24 yo G2P0101 at [redacted]w[redacted]d who presents with nausea and vomiting.  Pt has been taking Diclegis 2 pills at night.  Pt was seen on 10/24 for nausea and vomiting and given Zofran while in MAU.  Pt sees [facility] and had an appointment earlier this week with exam/pap and labs; she was given a prescription for Diclegis, which helps some. HOwever today is worse.  Pt started having lower abdominal cramping on the right side like ache pain. Pt denies spotting or bleeding.  Pt denies pain with urination.  Pt's last bowel movement was 2 days ago- hard balls- no pain . Pt ate yesterday but vomited. Pt has been able to keep fluids down.   Pt is scheduled for confirmation ultrasound and doctor appointment on 11/2 Pt has not had ultrasound with this pregnancy  RN note:      Expand All Collapse All   Pt c/o lower abdominal pain all day-dull and achy-rates 6/10. Denies vaginal bleeding or discharge. States she has also felt dizzy all day. Has nausea and some vomiting-nothing different than what she has been feeling.         Past Medical History  Diagnosis Date  . No pertinent past medical history   . NWGNFAOZ(308.6)     Past Surgical History  Procedure Laterality Date  . Wisdom tooth extraction    . Incision and drainage abscess anal  2013    I&D of right buttock  . Cesarean section  11/03/2012    Procedure: CESAREAN SECTION;  Surgeon: Bing Plume, MD;  Location: [facility];  Service: Obstetrics;  Laterality: N/A;  Primary cesarean section with delivery of baby boy at 38. Apgars 8/9.    Family History  Problem Relation Age of Onset  . Hypotension Neg Hx   . Mental illness Other     Social History  Substance Use Topics  . Smoking status: Current Every Day Smoker -- 0.50 packs/day for 8 years    Types: Cigarettes   . Smokeless tobacco: None  . Alcohol Use: Yes    Allergies:  Allergies  Allergen Reactions  . No Known Allergies   . Latex Rash    Yeast infection    Prescriptions prior to admission  Medication Sig Dispense Refill Last Dose  . Doxylamine-Pyridoxine (DICLEGIS) 10-10 MG TBEC Take 2 tablets by mouth at bedtime.   08/23/2015 at Unknown time  . Prenatal Vit-Fe Fumarate-FA (PRENATAL VITAMINS PLUS) 27-1 MG TABS Take 1 tablet by mouth daily. 30 tablet 11 Past Week at Unknown time  . hydrOXYzine (ATARAX/VISTARIL) 25 MG tablet Take 1 tablet (25 mg total) by mouth every 6 (six) hours as needed for anxiety (or CIWA score </= 10). (Patient not taking: Reported on 08/17/2015) 30 tablet 0   . ondansetron (ZOFRAN-ODT) 4 MG disintegrating tablet Take 1 tablet (4 mg total) by mouth every 8 (eight) hours as needed for nausea or vomiting. 40 tablet 2     Review of Systems  Constitutional: Negative for fever and chills.  Cardiovascular: Negative for chest pain.  Gastrointestinal: Positive for nausea, vomiting, abdominal pain and constipation. Negative for diarrhea.  Genitourinary: Negative for dysuria and urgency.  Musculoskeletal: Negative for back pain.  Neurological: Positive for dizziness. Negative for headaches.   Physical Exam  Blood pressure 95/55, pulse 102, temperature 98.6 F (37 C), resp. rate 18, height 5' (1.524 m), weight 105 lb 9.6 oz (47.9 kg), last menstrual period 06/20/2015, SpO2 99 %.  Physical Exam  Nursing note and vitals reviewed. Constitutional: She is oriented to person, place, and time. She appears well-developed and well-nourished. No distress.  HENT:  Head: Normocephalic.  Eyes: Pupils are equal, round, and reactive to light.  Neck: Normal range of motion. Neck supple.  Cardiovascular: Normal rate.   Respiratory: Effort normal.  GI: Soft. She exhibits no distension. There is no tenderness. There is no rebound and no guarding.  Musculoskeletal: Normal range of  motion.  Neurological: She is alert and oriented to person, place, and time.  Skin: Skin is warm and dry.  Psychiatric: She has a normal mood and affect.    MAU Course  Procedures  Results for orders placed or performed during the hospital encounter of 08/24/15 (from the past 24 hour(s))  Urinalysis, Routine w reflex microscopic (not at [facility])     Status: None   Collection Time: 08/24/15  9:06 PM  Result Value Ref Range   Color, Urine YELLOW YELLOW   APPearance CLEAR CLEAR   Specific Gravity, Urine 1.025 1.005 - 1.030   pH 6.0 5.0 - 8.0   Glucose, UA NEGATIVE NEGATIVE mg/dL   Hgb urine dipstick NEGATIVE NEGATIVE   Bilirubin Urine NEGATIVE NEGATIVE   Ketones, ur NEGATIVE NEGATIVE mg/dL   Protein, ur NEGATIVE NEGATIVE mg/dL   Urobilinogen, UA 0.2 0.0 - 1.0 mg/dL   Nitrite NEGATIVE NEGATIVE   Leukocytes, UA NEGATIVE NEGATIVE   US Ob Comp Less 14 Wks  08/24/2015  CLINICAL DATA:  Low pelvic pain EXAM: OBSTETRIC <14 WK [place] AND TRANSVAGINAL OB US TECHNIQUE: Both transabdominal and transvaginal ultrasound examinations were performed for complete evaluation of the gestation as well as the maternal uterus, adnexal regions, and pelvic cul-de-sac. Transvaginal technique was performed to assess early pregnancy. COMPARISON:  None. FINDINGS: Intrauterine gestational sac: Visualized/normal in shape. Yolk sac:  Present Embryo:  Present Cardiac Activity: Present Heart Rate: 164  bpm CRL:  23.5  mm   9 w   1 d                  [place] EDC: 03/27/2016 Maternal uterus/adnexae: There is some mild decreased echogenicity surrounding the gestational sac consistent with a small subchorionic hemorrhage. The ovaries are within normal limits. The uterus is otherwise unremarkable. IMPRESSION: Small subchorionic hemorrhage. Single live intrauterine gestation at 9 weeks 1 day. Electronically Signed   By: Alcide Clever M.D.   On: 08/24/2015 22:36   US Ob Transvaginal  08/24/2015  CLINICAL DATA:  Low pelvic pain EXAM:  OBSTETRIC <14 WK [place] AND TRANSVAGINAL OB US TECHNIQUE: Both transabdominal and transvaginal ultrasound examinations were performed for complete evaluation of the gestation as well as the maternal uterus, adnexal regions, and pelvic cul-de-sac. Transvaginal technique was performed to assess early pregnancy. COMPARISON:  None. FINDINGS: Intrauterine gestational sac: Visualized/normal in shape. Yolk sac:  Present Embryo:  Present Cardiac Activity: Present Heart Rate: 164  bpm CRL:  23.5  mm   9 w   1 d                  [place] EDC: 03/27/2016 Maternal uterus/adnexae: There is some mild decreased echogenicity surrounding the gestational sac consistent with a small subchorionic hemorrhage. The ovaries are within normal limits. The uterus is otherwise unremarkable. IMPRESSION: Small subchorionic hemorrhage. Single live  intrauterine gestation at 9 weeks 1 day. Electronically Signed   By: Alcide CleverMark  Lukens M.D.   On: 08/24/2015 22:36  pt was given meclezine 25mg  PO before ultrasound and when she returned pt threw up Phenergan 12.5mg  tablet given- pt wanting to go home Discussed with Dr. Senaida Oresichardson   Assessment and Plan  Hyperemesis in pregnancy Rx phenergan; diet discussed Abdominal pain in pregnancy- SLIUP [id number] Constipation- diet reviewed and Rx Colace F/u with [facility] as scheduled Return sooner if needed  [facility],[place] Vincelette 08/24/2015, 9:31 PM

## 2015-08-26 LAB — OB RESULTS CONSOLE HIV ANTIBODY (ROUTINE TESTING): HIV: NONREACTIVE

## 2015-08-26 LAB — OB RESULTS CONSOLE GC/CHLAMYDIA
Chlamydia: NEGATIVE
Gonorrhea: NEGATIVE

## 2015-08-26 LAB — OB RESULTS CONSOLE RPR: RPR: NONREACTIVE

## 2015-08-26 LAB — OB RESULTS CONSOLE RUBELLA ANTIBODY, IGM: RUBELLA: IMMUNE

## 2015-08-26 LAB — OB RESULTS CONSOLE ANTIBODY SCREEN: Antibody Screen: NEGATIVE

## 2015-08-26 LAB — OB RESULTS CONSOLE ABO/RH: RH TYPE: POSITIVE

## 2015-08-26 LAB — OB RESULTS CONSOLE HEPATITIS B SURFACE ANTIGEN: Hepatitis B Surface Ag: NEGATIVE

## 2015-08-26 LAB — OB RESULTS CONSOLE GBS: GBS: NEGATIVE

## 2015-10-07 ENCOUNTER — Inpatient Hospital Stay (HOSPITAL_COMMUNITY)
Admission: AD | Admit: 2015-10-07 | Discharge: 2015-10-07 | Disposition: A | Discharge status: Home / Self Care | Payer: Medicaid Other | Source: Ambulatory Visit | Attending: Obstetrics and Gynecology | Admitting: Obstetrics and Gynecology

## 2015-10-07 ENCOUNTER — Encounter (HOSPITAL_COMMUNITY): Payer: Self-pay

## 2015-10-07 DIAGNOSIS — M549 Dorsalgia, unspecified: Secondary | ICD-10-CM | POA: Diagnosis not present

## 2015-10-07 DIAGNOSIS — F17210 Nicotine dependence, cigarettes, uncomplicated: Secondary | ICD-10-CM | POA: Insufficient documentation

## 2015-10-07 DIAGNOSIS — O9989 Other specified diseases and conditions complicating pregnancy, childbirth and the puerperium: Secondary | ICD-10-CM

## 2015-10-07 DIAGNOSIS — Z3A15 15 weeks gestation of pregnancy: Secondary | ICD-10-CM | POA: Diagnosis not present

## 2015-10-07 DIAGNOSIS — O26892 Other specified pregnancy related conditions, second trimester: Secondary | ICD-10-CM

## 2015-10-07 DIAGNOSIS — O99332 Smoking (tobacco) complicating pregnancy, second trimester: Secondary | ICD-10-CM | POA: Insufficient documentation

## 2015-10-07 LAB — URINALYSIS, ROUTINE W REFLEX MICROSCOPIC
Bilirubin Urine: NEGATIVE
Glucose, UA: NEGATIVE mg/dL
Hgb urine dipstick: NEGATIVE
Ketones, ur: 40 mg/dL — AB
LEUKOCYTES UA: NEGATIVE
Nitrite: NEGATIVE
PROTEIN: NEGATIVE mg/dL
pH: 6 (ref 5.0–8.0)

## 2015-10-07 LAB — CBC
HEMATOCRIT: 33.7 % — AB (ref 36.0–46.0)
HEMOGLOBIN: 11.2 g/dL — AB (ref 12.0–15.0)
MCH: 28 pg (ref 26.0–34.0)
MCHC: 33.2 g/dL (ref 30.0–36.0)
MCV: 84.3 fL (ref 78.0–100.0)
Platelets: 249 10*3/uL (ref 150–400)
RBC: 4 MIL/uL (ref 3.87–5.11)
RDW: 14.1 % (ref 11.5–15.5)
WBC: 12.2 10*3/uL — AB (ref 4.0–10.5)

## 2015-10-07 MED ORDER — IBUPROFEN 600 MG PO TABS
600.0000 mg | ORAL_TABLET | Freq: Once | ORAL | Status: AC
  Administered 2015-10-07: 600 mg via ORAL
  Filled 2015-10-07: qty 1

## 2015-10-07 MED ORDER — IBUPROFEN 600 MG PO TABS: 600.0000 mg | ORAL_TABLET | Freq: Four times a day (QID) | ORAL | Status: DC | PRN

## 2015-10-07 NOTE — Discharge Instructions (Signed)
You may continue to use heating pad on affected area of back    Back Pain, Adult Back pain is very common in adults.The cause of back pain is rarely dangerous and the pain often gets better over time.The cause of your back pain may not be known. Some common causes of back pain include:  Strain of the muscles or ligaments supporting the spine.  Wear and tear (degeneration) of the spinal disks.  Arthritis.  Direct injury to the back. For many people, back pain may return. Since back pain is rarely dangerous, most people can learn to manage this condition on their own. HOME CARE INSTRUCTIONS Watch your back pain for any changes. The following actions may help to lessen any discomfort you are feeling:  Remain active. It is stressful on your back to sit or stand in one place for long periods of time. Do not sit, drive, or stand in one place for more than 30 minutes at a time. Take short walks on even surfaces as soon as you are able.Try to increase the length of time you walk each day.  Exercise regularly as directed by your health care provider. Exercise helps your back heal faster. It also helps avoid future injury by keeping your muscles strong and flexible.  Do not stay in bed.Resting more than 1-2 days can delay your recovery.  Pay attention to your body when you bend and lift. The most comfortable positions are those that put less stress on your recovering back. Always use proper lifting techniques, including:  Bending your knees.  Keeping the load close to your body.  Avoiding twisting.  Find a comfortable position to sleep. Use a firm mattress and lie on your side with your knees slightly bent. If you lie on your back, put a pillow under your knees.  Avoid feeling anxious or stressed.Stress increases muscle tension and can worsen back pain.It is important to recognize when you are anxious or stressed and learn ways to manage it, such as with exercise.  Take medicines only  as directed by your health care provider. Over-the-counter medicines to reduce pain and inflammation are often the most helpful.Your health care provider may prescribe muscle relaxant drugs.These medicines help dull your pain so you can more quickly return to your normal activities and healthy exercise.  Apply ice to the injured area:  Put ice in a plastic bag.  Place a towel between your skin and the bag.  Leave the ice on for 20 minutes, 2-3 times a day for the first 2-3 days. After that, ice and heat may be alternated to reduce pain and spasms.  Maintain a healthy weight. Excess weight puts extra stress on your back and makes it difficult to maintain good posture. SEEK MEDICAL CARE IF:  You have pain that is not relieved with rest or medicine.  You have increasing pain going down into the legs or buttocks.  You have pain that does not improve in one week.  You have night pain.  You lose weight.  You have a fever or chills. SEEK IMMEDIATE MEDICAL CARE IF:   You develop new bowel or bladder control problems.  You have unusual weakness or numbness in your arms or legs.  You develop nausea or vomiting.  You develop abdominal pain.  You feel faint.   This information is not intended to replace advice given to you by your health care provider. Make sure you discuss any questions you have with your health care provider.  Document Released: 10/10/2005 Document Revised: 10/31/2014 Document Reviewed: 02/11/2014 Elsevier Interactive Patient Education [employer].

## 2015-10-07 NOTE — MAU Note (Signed)
Pt dx'd with UTI last week, still taking antibiotic.  Continues to have back pain, has been vomiting for 3 days.  Denies dysuria.

## 2015-10-07 NOTE — MAU Provider Note (Signed)
History     CSN: [id number]  Arrival date and time: 10/07/15 6578   First Provider Initiated Contact with Patient 10/07/15 1850         Chief Complaint  Patient presents with  . Back Pain   HPI  Angela Henson is a 24 y.o. G2P0101 at [redacted]w[redacted]d who presents with back pain.  Reports pain in right lower back x 2 days. Describes as sharp. Pain occurs when lying on right side or on her back with her legs straight. Rates pain as 8/10. Has tried heating pads with some relief. No medication.  Diagnosed with UTI last week and says she's on day 3 of unknown antibiotic.  Denies urinary symptoms, abdominal pain, fever, or chills.  Reports some nausea normal for her throughout pregnancy. Denies vomiting today.  Denies vaginal bleeding.    OB History    Gravida Para Term Preterm AB TAB SAB Ectopic Multiple Living        Past Medical History  Diagnosis Date  . No pertinent past medical history   . IONGEXBM(841.3)     Past Surgical History  Procedure Laterality Date  . Wisdom tooth extraction    . Incision and drainage abscess anal  2013    I&D of right buttock  . Cesarean section  11/03/2012    Procedure: CESAREAN SECTION;  Surgeon: Bing Plume, MD;  Location: [facility];  Service: Obstetrics;  Laterality: N/A;  Primary cesarean section with delivery of baby boy at 84. Apgars 8/9.    Family History  Problem Relation Age of Onset  . Hypotension Neg Hx   . Mental illness Other     Social History  Substance Use Topics  . Smoking status: Current Every Day Smoker -- 0.50 packs/day for 8 years    Types: Cigarettes  . Smokeless tobacco: None  . Alcohol Use: Yes    Allergies:  Allergies  Allergen Reactions  . No Known Allergies   . Latex Rash    Yeast infection    Prescriptions prior to admission  Medication Sig Dispense Refill Last Dose  . docusate sodium (COLACE) 100 MG capsule Take 1 capsule (100 mg total) by mouth every 12 (twelve) hours. (Patient  not taking: Reported on 10/07/2015) 60 capsule 0 Not Taking at Unknown time  . hydrOXYzine (ATARAX/VISTARIL) 25 MG tablet Take 1 tablet (25 mg total) by mouth every 6 (six) hours as needed for anxiety (or CIWA score </= 10). (Patient not taking: Reported on 10/07/2015) 30 tablet 0 Not Taking at Unknown time  . ondansetron (ZOFRAN-ODT) 4 MG disintegrating tablet Take 1 tablet (4 mg total) by mouth every 8 (eight) hours as needed for nausea or vomiting. (Patient not taking: Reported on 10/07/2015) 40 tablet 2 Not Taking at Unknown time  . Prenatal Vit-Fe Fumarate-FA (PRENATAL VITAMINS PLUS) 27-1 MG TABS Take 1 tablet by mouth daily. (Patient not taking: Reported on 10/07/2015) 30 tablet 11 Not Taking at Unknown time  . promethazine (PHENERGAN) 25 MG tablet Take 1 tablet (25 mg total) by mouth every 6 (six) hours as needed for nausea or vomiting. (Patient not taking: Reported on 10/07/2015) 30 tablet 0 Not Taking at Unknown time    Review of Systems  Constitutional: Negative for fever and chills.  Gastrointestinal: Positive for nausea. Negative for vomiting, abdominal pain, diarrhea and constipation.  Genitourinary: Negative for dysuria, urgency, frequency, hematuria and flank pain.       No vaginal  bleeding  Musculoskeletal: Positive for back pain. Negative for falls.   Physical Exam   Blood pressure 97/69, pulse 103, temperature 98.2 F (36.8 C), temperature source Oral, resp. rate 18, last menstrual period 06/20/2015.  Physical Exam  Nursing note and vitals reviewed. Constitutional: She is oriented to person, place, and time. She appears well-developed and well-nourished. No distress.  HENT:  Head: Normocephalic and atraumatic.  Eyes: Conjunctivae are normal. Right eye exhibits no discharge. Left eye exhibits no discharge. No scleral icterus.  Neck: Normal range of motion.  Cardiovascular: Normal rate, regular rhythm and normal heart sounds.   No murmur heard. Respiratory: Effort normal  and breath sounds normal. No respiratory distress. She has no wheezes.  GI: Soft. Bowel sounds are normal. She exhibits no distension. There is no tenderness. There is no rebound, no guarding and no CVA tenderness.  Neurological: She is alert and oriented to person, place, and time.  Skin: Skin is warm and dry. She is not diaphoretic.  Psychiatric: She has a normal mood and affect. Her behavior is normal. Judgment and thought content normal.    MAU Course  Procedures Results for orders placed or performed during the hospital encounter of 10/07/15 (from the past 24 hour(s))  Urinalysis, Routine w reflex microscopic (not [facility])     Status: Abnormal   Collection Time: 10/07/15  5:40 PM  Result Value Ref Range   Color, Urine YELLOW YELLOW   APPearance CLEAR CLEAR   Specific Gravity, Urine >1.030 (H) 1.005 - 1.030   pH 6.0 5.0 - 8.0   Glucose, UA NEGATIVE NEGATIVE mg/dL   Hgb urine dipstick NEGATIVE NEGATIVE   Bilirubin Urine NEGATIVE NEGATIVE   Ketones, ur 40 (A) NEGATIVE mg/dL   Protein, ur NEGATIVE NEGATIVE mg/dL   Nitrite NEGATIVE NEGATIVE   Leukocytes, UA NEGATIVE NEGATIVE  CBC     Status: Abnormal   Collection Time: 10/07/15  6:51 PM  Result Value Ref Range   WBC 12.2 (H) 4.0 - 10.5 K/uL   RBC 4.00 3.87 - 5.11 MIL/uL   Hemoglobin 11.2 (L) 12.0 - 15.0 g/dL   HCT 11.933.7 (L) 14.736.0 - 82.946.0 %   MCV 84.3 78.0 - 100.0 fL   MCH 28.0 26.0 - 34.0 pg   MCHC 33.2 30.0 - 36.0 g/dL   RDW 56.214.1 13.011.5 - 86.515.5 %   Platelets 249 150 - 400 K/uL    MDM FHT 154 by doppler 1930- S/w Dr. Ellyn HackBovard. Ok to discharge home. Can use nsaid & heating pad for back pain.  Assessment and Plan  A:  1. Back pain affecting pregnancy in second trimester    P: Discharge home Continue taking abx as directed Rx motrin 600mg  prn back pain Use heating pad on affected area Discussed reasons to return to MAU Keep scheduled prenatal care  Judeth HornErin Geraldy Akridge, NP  10/07/2015, 6:49 PM

## 2016-01-09 ENCOUNTER — Inpatient Hospital Stay (HOSPITAL_COMMUNITY)
Admission: AD | Admit: 2016-01-09 | Discharge: 2016-01-09 | Disposition: A | Discharge status: Home / Self Care | Payer: Medicaid Other | Source: Ambulatory Visit | Attending: Obstetrics and Gynecology | Admitting: Obstetrics and Gynecology

## 2016-01-09 ENCOUNTER — Encounter (HOSPITAL_COMMUNITY): Payer: Self-pay | Admitting: Certified Nurse Midwife

## 2016-01-09 DIAGNOSIS — Z3A29 29 weeks gestation of pregnancy: Secondary | ICD-10-CM | POA: Insufficient documentation

## 2016-01-09 DIAGNOSIS — F17210 Nicotine dependence, cigarettes, uncomplicated: Secondary | ICD-10-CM | POA: Diagnosis not present

## 2016-01-09 DIAGNOSIS — O99333 Smoking (tobacco) complicating pregnancy, third trimester: Secondary | ICD-10-CM | POA: Insufficient documentation

## 2016-01-09 DIAGNOSIS — O4703 False labor before 37 completed weeks of gestation, third trimester: Secondary | ICD-10-CM

## 2016-01-09 DIAGNOSIS — R109 Unspecified abdominal pain: Secondary | ICD-10-CM | POA: Diagnosis present

## 2016-01-09 DIAGNOSIS — O479 False labor, unspecified: Secondary | ICD-10-CM

## 2016-01-09 LAB — URINALYSIS, ROUTINE W REFLEX MICROSCOPIC
Bilirubin Urine: NEGATIVE
GLUCOSE, UA: NEGATIVE mg/dL
Hgb urine dipstick: NEGATIVE
KETONES UR: 15 mg/dL — AB
NITRITE: NEGATIVE
PROTEIN: NEGATIVE mg/dL
Specific Gravity, Urine: 1.02 (ref 1.005–1.030)
pH: 7 (ref 5.0–8.0)

## 2016-01-09 LAB — URINE MICROSCOPIC-ADD ON: RBC / HPF: NONE SEEN RBC/hpf (ref 0–5)

## 2016-01-09 NOTE — Discharge Instructions (Signed)
Fetal Movement Counts °Patient Name: __________________________________________________ Patient Due Date: ____________________ °Performing a fetal movement count is highly recommended in high-risk pregnancies, but it is good for every pregnant woman to do. Your health care provider may ask you to start counting fetal movements at 28 weeks of the pregnancy. Fetal movements often increase: °· After eating a full meal. °· After physical activity. °· After eating or drinking something sweet or cold. °· At rest. °Pay attention to when you feel the baby is most active. This will help you notice a pattern of your baby's sleep and wake cycles and what factors contribute to an increase in fetal movement. It is important to perform a fetal movement count at the same time each day when your baby is normally most active.  °HOW TO COUNT FETAL MOVEMENTS °· Find a quiet and comfortable area to sit or lie down on your left side. Lying on your left side provides the best blood and oxygen circulation to your baby. °· Write down the day and time on a sheet of paper or in a journal. °· Start counting kicks, flutters, swishes, rolls, or jabs in a 2-hour period. You should feel at least 10 movements within 2 hours. °· If you do not feel 10 movements in 2 hours, wait 2-3 hours and count again. Look for a change in the pattern or not enough counts in 2 hours. °SEEK MEDICAL CARE IF: °· You feel less than 10 counts in 2 hours, tried twice. °· There is no movement in over an hour. °· The pattern is changing or taking longer each day to reach 10 counts in 2 hours. °· You feel the baby is not moving as he or she usually does. °Date: ____________ Movements: ____________ Start time: ____________ Finish time: ____________  °Date: ____________ Movements: ____________ Start time: ____________ Finish time: ____________ °Date: ____________ Movements: ____________ Start time: ____________ Finish time: ____________ °Date: ____________ Movements:  ____________ Start time: ____________ Finish time: ____________ °Date: ____________ Movements: ____________ Start time: ____________ Finish time: ____________ °Date: ____________ Movements: ____________ Start time: ____________ Finish time: ____________ °Date: ____________ Movements: ____________ Start time: ____________ Finish time: ____________ °Date: ____________ Movements: ____________ Start time: ____________ Finish time: ____________  °Date: ____________ Movements: ____________ Start time: ____________ Finish time: ____________ °Date: ____________ Movements: ____________ Start time: ____________ Finish time: ____________ °Date: ____________ Movements: ____________ Start time: ____________ Finish time: ____________ °Date: ____________ Movements: ____________ Start time: ____________ Finish time: ____________ °Date: ____________ Movements: ____________ Start time: ____________ Finish time: ____________ °Date: ____________ Movements: ____________ Start time: ____________ Finish time: ____________ °Date: ____________ Movements: ____________ Start time: ____________ Finish time: ____________  °Date: ____________ Movements: ____________ Start time: ____________ Finish time: ____________ °Date: ____________ Movements: ____________ Start time: ____________ Finish time: ____________ °Date: ____________ Movements: ____________ Start time: ____________ Finish time: ____________ °Date: ____________ Movements: ____________ Start time: ____________ Finish time: ____________ °Date: ____________ Movements: ____________ Start time: ____________ Finish time: ____________ °Date: ____________ Movements: ____________ Start time: ____________ Finish time: ____________ °Date: ____________ Movements: ____________ Start time: ____________ Finish time: ____________  °Date: ____________ Movements: ____________ Start time: ____________ Finish time: ____________ °Date: ____________ Movements: ____________ Start time: ____________ Finish  time: ____________ °Date: ____________ Movements: ____________ Start time: ____________ Finish time: ____________ °Date: ____________ Movements: ____________ Start time: ____________ Finish time: ____________ °Date: ____________ Movements: ____________ Start time: ____________ Finish time: ____________ °Date: ____________ Movements: ____________ Start time: ____________ Finish time: ____________ °Date: ____________ Movements: ____________ Start time: ____________ Finish time: ____________  °Date: ____________ Movements: ____________ Start time: ____________ Finish   time: ____________ Date: ____________ Movements: ____________ Start time: ____________ Doreatha MartinFinish time: ____________ Date: ____________ Movements: ____________ Start time: ____________ Doreatha MartinFinish time: ____________ Date: ____________ Movements: ____________ Start time: ____________ Doreatha MartinFinish time: ____________ Date: ____________ Movements: ____________ Start time: ____________ Doreatha MartinFinish time: ____________ Date: ____________ Movements: ____________ Start time: ____________ Doreatha MartinFinish time: ____________ Date: ____________ Movements: ____________ Start time: ____________ Doreatha MartinFinish time: ____________  Date: ____________ Movements: ____________ Start time: ____________ Doreatha MartinFinish time: ____________ Date: ____________ Movements: ____________ Start time: ____________ Doreatha MartinFinish time: ____________ Date: ____________ Movements: ____________ Start time: ____________ Doreatha MartinFinish time: ____________ Date: ____________ Movements: ____________ Start time: ____________ Doreatha MartinFinish time: ____________ Date: ____________ Movements: ____________ Start time: ____________ Doreatha MartinFinish time: ____________ Date: ____________ Movements: ____________ Start time: ____________ Doreatha MartinFinish time: ____________ Date: ____________ Movements: ____________ Start time: ____________ Doreatha MartinFinish time: ____________  Date: ____________ Movements: ____________ Start time: ____________ Doreatha MartinFinish time: ____________ Date: ____________  Movements: ____________ Start time: ____________ Doreatha MartinFinish time: ____________ Date: ____________ Movements: ____________ Start time: ____________ Doreatha MartinFinish time: ____________ Date: ____________ Movements: ____________ Start time: ____________ Doreatha MartinFinish time: ____________ Date: ____________ Movements: ____________ Start time: ____________ Doreatha MartinFinish time: ____________ Date: ____________ Movements: ____________ Start time: ____________ Doreatha MartinFinish time: ____________ Date: ____________ Movements: ____________ Start time: ____________ Doreatha MartinFinish time: ____________  Date: ____________ Movements: ____________ Start time: ____________ Doreatha MartinFinish time: ____________ Date: ____________ Movements: ____________ Start time: ____________ Doreatha MartinFinish time: ____________ Date: ____________ Movements: ____________ Start time: ____________ Doreatha MartinFinish time: ____________ Date: ____________ Movements: ____________ Start time: ____________ Doreatha MartinFinish time: ____________ Date: ____________ Movements: ____________ Start time: ____________ Doreatha MartinFinish time: ____________ Date: ____________ Movements: ____________ Start time: ____________ Doreatha MartinFinish time: ____________   This information is not intended to replace advice given to you by your health care provider. Make sure you discuss any questions you have with your health care provider.   Document Released: 11/09/2006 Document Revised: 10/31/2014 Document Reviewed: 08/06/2012 Elsevier Interactive Patient Education 2016 [employer] Preterm Labor Information Preterm labor is when labor starts before you are [redacted] weeks pregnant. The normal length of pregnancy is 39 to 41 weeks.  CAUSES  The cause of preterm labor is not often known. The most common known cause is infection. RISK FACTORS  Having a history of preterm labor.  Having your water break before it should.  Having a placenta that covers the opening of the cervix.  Having a placenta that breaks away from the uterus.  Having a cervix that is too weak to  hold the baby in the uterus.  Having too much fluid in the amniotic sac.  Taking drugs or smoking while pregnant.  Not gaining enough weight while pregnant.  Being younger than 7118 and older than 23 years old.  Having a low income.  Being African American. SYMPTOMS  Period-like cramps, belly (abdominal) pain, or back pain.  Contractions that are regular, as often as six in an hour. They may be mild or painful.  Contractions that start at the top of the belly. They then move to the lower belly and back.  Lower belly pressure that seems to get stronger.  Bleeding from the vagina.  Fluid leaking from the vagina. TREATMENT  Treatment depends on:  Your condition.  The condition of your baby.  How many weeks pregnant you are. Your doctor may have you:  Take medicine to stop contractions.  Stay in bed except to use the restroom (bed rest).  Stay in the hospital. WHAT SHOULD YOU DO IF YOU THINK YOU ARE IN PRETERM LABOR? Call your doctor right away. You need to go to the hospital right away.  HOW CAN  YOU PREVENT PRETERM LABOR IN FUTURE PREGNANCIES?  Stop smoking, if you smoke.  Maintain healthy weight gain.  Do not take drugs or be around chemicals that are not needed.  Tell your doctor if you think you have an infection.  Tell your doctor if you had a preterm labor before.   This information is not intended to replace advice given to you by your health care provider. Make sure you discuss any questions you have with your health care provider.   Document Released: 01/06/2009 Document Revised: 02/24/2015 Document Reviewed: 11/12/2012 Elsevier Interactive Patient Education 2016 [employer] Inc[employer]  [employer] of the uterus can occur throughout pregnancy. Contractions are not always a sign that you are in labor.  WHAT ARE BRAXTON HICKS CONTRACTIONS?  Contractions that occur before labor are called Braxton Hicks contractions, or false labor.  Toward the end of pregnancy (32-34 weeks), these contractions can develop more often and may become more forceful. This is not true labor because these contractions do not result in opening (dilatation) and thinning of the cervix. They are sometimes difficult to tell apart from true labor because these contractions can be forceful and people have different pain tolerances. You should not feel embarrassed if you go to the hospital with false labor. Sometimes, the only way to tell if you are in true labor is for your health care provider to look for changes in the cervix. If there are no prenatal problems or other health problems associated with the pregnancy, it is completely safe to be sent home with false labor and await the onset of true labor. HOW CAN YOU TELL THE DIFFERENCE BETWEEN TRUE AND FALSE LABOR? False Labor  The contractions of false labor are usually shorter and not as hard as those of true labor.   The contractions are usually irregular.   The contractions are often felt in the front of the lower abdomen and in the groin.   The contractions may go away when you walk around or change positions while lying down.   The contractions get weaker and are shorter lasting as time goes on.   The contractions do not usually become progressively stronger, regular, and closer together as with true labor.  True Labor  Contractions in true labor last 30-70 seconds, become very regular, usually become more intense, and increase in frequency.   The contractions do not go away with walking.   The discomfort is usually felt in the top of the uterus and spreads to the lower abdomen and low back.   True labor can be determined by your health care provider with an exam. This will show that the cervix is dilating and getting thinner.  WHAT TO REMEMBER  Keep up with your usual exercises and follow other instructions given by your health care provider.   Take medicines as directed by  your health care provider.   Keep your regular prenatal appointments.   Eat and drink lightly if you think you are going into labor.   If Braxton Hicks contractions are making you uncomfortable:   Change your position from lying down or resting to walking, or from walking to resting.   Sit and rest in a tub of warm water.   Drink 2-3 glasses of water. Dehydration may cause these contractions.   Do slow and deep breathing several times an hour.  WHEN SHOULD I SEEK IMMEDIATE MEDICAL CARE? Seek immediate medical care if:  Your contractions become stronger, more regular, and closer together.  You have fluid leaking or gushing from your vagina.   You have a fever.   You pass blood-tinged mucus.   You have vaginal bleeding.   You have continuous abdominal pain.   You have low back pain that you never had before.   You feel your baby's head pushing down and causing pelvic pressure.   Your baby is not moving as much as it used to.    This information is not intended to replace advice given to you by your health care provider. Make sure you discuss any questions you have with your health care provider.   Document Released: 10/10/2005 Document Revised: 10/15/2013 Document Reviewed: 07/22/2013 Elsevier Interactive Patient Education [employer]

## 2016-01-09 NOTE — MAU Provider Note (Signed)
History     CSN: [id number]  Arrival date and time: 01/09/16 [id number]   First Provider Initiated Contact with Patient 01/09/16 1006      Chief Complaint  Patient presents with  . Abdominal Pain    pressure, pain    HPI   Angela Henson Haslip is a 25 y.o. female G2P0101 at 5663w0d that presents with braxton hicks contractions that started last night. The was feeling the tightening of her abdomen every 7-8 mins; the tightening was not painful, she just worried about the  Tightening of her abdomen.   Patient states she does not drink much water; "maybe one bottle per day".   She denies contractions since her arrival She denies pain at this time + fetal movement Denies vaginal bleeding.   OB History    Gravida Para Term Preterm AB TAB SAB Ectopic Multiple Living   2 1 0 1 0 0 0 0 0 1       Past Medical History  Diagnosis Date  . No pertinent past medical history   . JXBJYNWG(956.2Headache(784.0)     Past Surgical History  Procedure Laterality Date  . Wisdom tooth extraction    . Incision and drainage abscess anal  2013    I&D of right buttock  . Cesarean section  11/03/2012    Procedure: CESAREAN SECTION;  Surgeon: Bing Plumehomas F Henley, MD;  Location: [facility];  Service: Obstetrics;  Laterality: N/A;  Primary cesarean section with delivery of baby boy at [id number]. Apgars 8/9.    Family History  Problem Relation Age of Onset  . Hypotension Neg Hx   . Mental illness Other     Social History  Substance Use Topics  . Smoking status: Current Every Day Smoker -- 0.50 packs/day for 8 years    Types: Cigarettes  . Smokeless tobacco: None  . Alcohol Use: No    Allergies: No Known Allergies  Prescriptions prior to admission  Medication Sig Dispense Refill Last Dose  . docusate sodium (COLACE) 100 MG capsule Take 1 capsule (100 mg total) by mouth every 12 (twelve) hours. (Patient not taking: Reported on 10/07/2015) 60 capsule 0 Not Taking at Unknown time  . ibuprofen (ADVIL,MOTRIN) 600 MG tablet Take 1  tablet (600 mg total) by mouth every 6 (six) hours as needed. (Patient not taking: Reported on 01/09/2016) 30 tablet 0 Not Taking at Unknown time  . ondansetron (ZOFRAN-ODT) 4 MG disintegrating tablet Take 1 tablet (4 mg total) by mouth every 8 (eight) hours as needed for nausea or vomiting. (Patient not taking: Reported on 10/07/2015) 40 tablet 2 Not Taking at Unknown time  . Prenatal Vit-Fe Fumarate-FA (PRENATAL VITAMINS PLUS) 27-1 MG TABS Take 1 tablet by mouth daily. (Patient not taking: Reported on 10/07/2015) 30 tablet 11 Not Taking at Unknown time  . promethazine (PHENERGAN) 25 MG tablet Take 1 tablet (25 mg total) by mouth every 6 (six) hours as needed for nausea or vomiting. (Patient not taking: Reported on 10/07/2015) 30 tablet 0 Not Taking at Unknown time   Results for orders placed or performed during the hospital encounter of 01/09/16 (from the past 48 hour(s))  Urinalysis, Routine w reflex microscopic ([facility])     Status: Abnormal   Collection Time: 01/09/16  9:36 AM  Result Value Ref Range   Color, Urine YELLOW YELLOW   APPearance CLEAR CLEAR   Specific Gravity, Urine 1.020 1.005 - 1.030   pH 7.0 5.0 - 8.0   Glucose, UA NEGATIVE NEGATIVE mg/dL  Hgb urine dipstick NEGATIVE NEGATIVE   Bilirubin Urine NEGATIVE NEGATIVE   Ketones, ur 15 (A) NEGATIVE mg/dL   Protein, ur NEGATIVE NEGATIVE mg/dL   Nitrite NEGATIVE NEGATIVE   Leukocytes, UA SMALL (A) NEGATIVE  Urine microscopic-add on     Status: Abnormal   Collection Time: 01/09/16  9:36 AM  Result Value Ref Range   Squamous Epithelial / LPF 6-30 (A) NONE SEEN   WBC, UA TOO NUMEROUS TO COUNT 0 - 5 WBC/hpf   RBC / HPF NONE SEEN 0 - 5 RBC/hpf   Bacteria, UA FEW (A) NONE SEEN   Urine-Other MUCOUS PRESENT     Review of Systems  Constitutional: Negative for fever and chills.  Gastrointestinal: Negative for abdominal pain.  Genitourinary: Negative for dysuria and urgency.   Physical Exam   Blood pressure 101/64, pulse 117,  temperature 98 F (36.7 C), temperature source Oral, resp. rate 18, height 5' (1.524 m), weight 115 lb (52.164 kg), last menstrual period 06/20/2015.  Physical Exam  Constitutional: She is oriented to person, place, and time. She appears well-developed and well-nourished. No distress.  HENT:  Head: Normocephalic.  Eyes: Pupils are equal, round, and reactive to light.  Neck: Neck supple.  Cardiovascular: Normal rate.   Respiratory: Effort normal.  GI: Soft. She exhibits no distension. There is no tenderness.  Genitourinary:  Dilation: Closed Cervical Position: Posterior Exam by:: Venia Carbon NP  Musculoskeletal: Normal range of motion.  Neurological: She is alert and oriented to person, place, and time.  Skin: Skin is warm. She is not diaphoretic.  Psychiatric: Her behavior is normal.   Fetal Tracing: Baseline: 140 bpm  Variability: Moderate  Accelerations: 15x15 Decelerations: quick variables  Toco: quiet.   MAU Course  Procedures  None  MDM  Urine culture pending. Patient asymptomatic for UTI PO hydration encouraged Discussed patient including HPI>history>fetal tracing> and labs with Dr. Senaida Ores.    Assessment and Plan   A:  1. Braxton Hicks contractions    P:  Discharge home in stable condition Discussed the importance of PO hydration Preterm labor precautions. Return to MAU if symptoms worsen Urine culture pending Fetal kick counts.    Duane Lope, NP 01/09/2016 1:14 PM

## 2016-02-23 ENCOUNTER — Inpatient Hospital Stay (HOSPITAL_COMMUNITY): Payer: Medicaid Other

## 2016-02-23 ENCOUNTER — Inpatient Hospital Stay (HOSPITAL_COMMUNITY)
Admission: AD | Admit: 2016-02-23 | Discharge: 2016-02-23 | Disposition: A | Discharge status: Home / Self Care | Payer: Medicaid Other | Source: Ambulatory Visit | Attending: Obstetrics and Gynecology | Admitting: Obstetrics and Gynecology

## 2016-02-23 ENCOUNTER — Encounter (HOSPITAL_COMMUNITY): Payer: Self-pay | Admitting: *Deleted

## 2016-02-23 DIAGNOSIS — O4703 False labor before 37 completed weeks of gestation, third trimester: Secondary | ICD-10-CM | POA: Diagnosis not present

## 2016-02-23 DIAGNOSIS — Z87891 Personal history of nicotine dependence: Secondary | ICD-10-CM | POA: Insufficient documentation

## 2016-02-23 DIAGNOSIS — Z3A35 35 weeks gestation of pregnancy: Secondary | ICD-10-CM | POA: Diagnosis not present

## 2016-02-23 DIAGNOSIS — R109 Unspecified abdominal pain: Secondary | ICD-10-CM | POA: Diagnosis present

## 2016-02-23 DIAGNOSIS — M549 Dorsalgia, unspecified: Secondary | ICD-10-CM | POA: Diagnosis present

## 2016-02-23 LAB — URINALYSIS, ROUTINE W REFLEX MICROSCOPIC
Bilirubin Urine: NEGATIVE
GLUCOSE, UA: NEGATIVE mg/dL
KETONES UR: 15 mg/dL — AB
Nitrite: NEGATIVE
PH: 7 (ref 5.0–8.0)
PROTEIN: 100 mg/dL — AB
SPECIFIC GRAVITY, URINE: 1.025 (ref 1.005–1.030)

## 2016-02-23 LAB — URINE MICROSCOPIC-ADD ON

## 2016-02-23 MED ORDER — LACTATED RINGERS IV SOLN
INTRAVENOUS | Status: DC
  Administered 2016-02-23: 16:00:00 via INTRAVENOUS

## 2016-02-23 MED ORDER — NIFEDIPINE 10 MG PO CAPS
20.0000 mg | ORAL_CAPSULE | Freq: Once | ORAL | Status: AC
  Administered 2016-02-23: 20 mg via ORAL
  Filled 2016-02-23: qty 2

## 2016-02-23 NOTE — MAU Note (Signed)
Attempted SVE, patient very tearful, and pushing my hands away. Able to get to introitus, but stopped when she pushed RNs hand away. MAU NP notified.

## 2016-02-23 NOTE — MAU Note (Signed)
Lower back pain on right for about a week now. Today right lower abdominal pain started as well. Noticed more mucousy discharge. Called MD office but never received a call back.
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
## 2016-02-23 NOTE — Progress Notes (Signed)
NP attempting to perform SVE. Unable to perform due to patient's inability to tolerate.

## 2016-02-23 NOTE — Discharge Instructions (Signed)
Braxton Hicks Contractions °Contractions of the uterus can occur throughout pregnancy. Contractions are not always a sign that you are in labor.  °WHAT ARE BRAXTON HICKS CONTRACTIONS?  °Contractions that occur before labor are called Braxton Hicks contractions, or false labor. Toward the end of pregnancy (32-34 weeks), these contractions can develop more often and may become more forceful. This is not true labor because these contractions do not result in opening (dilatation) and thinning of the cervix. They are sometimes difficult to tell apart from true labor because these contractions can be forceful and people have different pain tolerances. You should not feel embarrassed if you go to the hospital with false labor. Sometimes, the only way to tell if you are in true labor is for your health care provider to look for changes in the cervix. °If there are no prenatal problems or other health problems associated with the pregnancy, it is completely safe to be sent home with false labor and await the onset of true labor. °HOW CAN YOU TELL THE DIFFERENCE BETWEEN TRUE AND FALSE LABOR? °False Labor °· The contractions of false labor are usually shorter and not as hard as those of true labor.   °· The contractions are usually irregular.   °· The contractions are often felt in the front of the lower abdomen and in the groin.   °· The contractions may go away when you walk around or change positions while lying down.   °· The contractions get weaker and are shorter lasting as time goes on.   °· The contractions do not usually become progressively stronger, regular, and closer together as with true labor.   °True Labor °· Contractions in true labor last 30-70 seconds, become very regular, usually become more intense, and increase in frequency.   °· The contractions do not go away with walking.   °· The discomfort is usually felt in the top of the uterus and spreads to the lower abdomen and low back.   °· True labor can be  determined by your health care provider with an exam. This will show that the cervix is dilating and getting thinner.   °WHAT TO REMEMBER °· Keep up with your usual exercises and follow other instructions given by your health care provider.   °· Take medicines as directed by your health care provider.   °· Keep your regular prenatal appointments.   °· Eat and drink lightly if you think you are going into labor.   °· If Braxton Hicks contractions are making you uncomfortable:   °¨ Change your position from lying down or resting to walking, or from walking to resting.   °¨ Sit and rest in a tub of warm water.   °¨ Drink 2-3 glasses of water. Dehydration may cause these contractions.   °¨ Do slow and deep breathing several times an hour.   °WHEN SHOULD I SEEK IMMEDIATE MEDICAL CARE? °Seek immediate medical care if: °· Your contractions become stronger, more regular, and closer together.   °· You have fluid leaking or gushing from your vagina.   °· You have a fever.   °· You pass blood-tinged mucus.   °· You have vaginal bleeding.   °· You have continuous abdominal pain.   °· You have low back pain that you never had before.   °· You feel your baby's head pushing down and causing pelvic pressure.   °· Your baby is not moving as much as it used to.   °  °This information is not intended to replace advice given to you by your health care provider. Make sure you discuss any questions you have with your health care   provider. °  °Document Released: 10/10/2005 Document Revised: 10/15/2013 Document Reviewed: 07/22/2013 °Elsevier Interactive Patient Education ©2016 [employer] ° °

## 2016-02-24 LAB — CULTURE, OB URINE

## 2016-03-11 ENCOUNTER — Encounter (HOSPITAL_COMMUNITY): Payer: Self-pay

## 2016-03-14 ENCOUNTER — Encounter (HOSPITAL_COMMUNITY): Payer: Self-pay

## 2016-03-18 ENCOUNTER — Encounter (HOSPITAL_COMMUNITY)
Admission: RE | Admit: 2016-03-18 | Discharge: 2016-03-18 | Disposition: A | Discharge status: Home / Self Care | Payer: Medicaid Other | Source: Ambulatory Visit | Attending: Obstetrics and Gynecology | Admitting: Obstetrics and Gynecology

## 2016-03-18 DIAGNOSIS — Z029 Encounter for administrative examinations, unspecified: Secondary | ICD-10-CM | POA: Insufficient documentation

## 2016-03-18 HISTORY — DX: Personal history of other infectious and parasitic diseases: Z86.19

## 2016-03-18 NOTE — Patient Instructions (Signed)
20 Jessy OtoDebra L Fiscus  03/18/2016   Your procedure is scheduled on:  03/22/2016  Enter through the Main Entrance of [facility] at 0800 AM.  Pick up the phone at the desk and dial [phone].   Call this number if you have problems the morning of surgery: [phone]   Remember:   Do not eat food:After Midnight.  Do not drink clear liquids: After Midnight.  Take these medicines the morning of surgery with A SIP OF WATER: NONE   Do not wear jewelry, make-up or nail polish.  Do not wear lotions, powders, or perfumes. You may wear deodorant.  Do not shave 48 hours prior to surgery.  Do not bring valuables to the hospital.  [facility] is not   responsible for any belongings or valuables brought to the hospital.  Contacts, dentures or bridgework may not be worn into surgery.  Leave suitcase in the car. After surgery it may be brought to your room.  For patients admitted to the hospital, checkout time is 11:00 AM the day of              discharge.   Patients discharged the day of surgery will not be allowed to drive             home.  Name and phone number of your driver: NA  Special Instructions:   Shower using CHG 2 nights before surgery and the night before surgery.  If you shower the day of surgery use CHG.  Use special wash - you have one bottle of CHG for all showers.  You should use approximately 1/3 of the bottle for each shower.   Please read over the following fact sheets that you were given:   Surgical Site Infection Prevention

## 2016-03-20 DIAGNOSIS — Z029 Encounter for administrative examinations, unspecified: Secondary | ICD-10-CM | POA: Diagnosis not present

## 2016-03-20 LAB — TYPE AND SCREEN
ABO/RH(D): B POS
Antibody Screen: NEGATIVE

## 2016-03-20 LAB — CBC
HEMATOCRIT: 28.8 % — AB (ref 36.0–46.0)
HEMOGLOBIN: 9.1 g/dL — AB (ref 12.0–15.0)
MCH: 24.3 pg — ABNORMAL LOW (ref 26.0–34.0)
MCHC: 31.6 g/dL (ref 30.0–36.0)
MCV: 76.8 fL — ABNORMAL LOW (ref 78.0–100.0)
PLATELETS: 271 10*3/uL (ref 150–400)
RBC: 3.75 MIL/uL — ABNORMAL LOW (ref 3.87–5.11)
RDW: 15.1 % (ref 11.5–15.5)
WBC: 14 10*3/uL — ABNORMAL HIGH (ref 4.0–10.5)

## 2016-03-20 NOTE — H&P (Signed)
Angela Henson is a 25 y.o. female presenting for scheduled repeat c/s. Pt has had a benign prenatal course beginning in first trimester. Dating is based on lmp and confirmed by first trimester [place]. She stopped marijuana and cigarette use when found out was pregnant - no use during pregnancy. She declined genetic screening. She was treated for a uti during the pregnancy. [place] noted a circumvallate placenta but no abnl. Pt decided on repeat c/s rather than attempt TOLAC. FOB not involved. Pt had preeclampsia with last pregnancy but none with this one - baseline 24hr urine collection for protein was done - nl  Maternal Medical History:  Reason for admission: Nausea. Scheduled repeat c/s  Fetal activity: Perceived fetal activity is normal.   Last perceived fetal movement was within the past hour.    Prenatal complications: no prenatal complications Prenatal Complications - Diabetes: none.    OB History    Gravida Para Term Preterm AB TAB SAB Ectopic Multiple Living       Past Medical History  Diagnosis Date  . Headache(784.0)   . Hx of varicella   . Hx of chlamydia infection    Past Surgical History  Procedure Laterality Date  . Wisdom tooth extraction    . Incision and drainage abscess anal  2013    I&D of right buttock  . Cesarean section  11/03/2012    Procedure: CESAREAN SECTION;  Surgeon: Bing Plume, MD;  Location: [facility];  Service: Obstetrics;  Laterality: N/A;  Primary cesarean section with delivery of baby boy at 42. Apgars 8/9.   Family History: family history includes Diabetes in her father. There is no history of Hypotension, Asthma, Arthritis, Alcohol abuse, Birth defects, Cancer, COPD, Depression, Drug abuse, Early death, Hearing loss, Heart disease, Hyperlipidemia, Hypertension, Kidney disease, Mental illness, Learning disabilities, Mental retardation, Miscarriages / Stillbirths, Stroke, Vision loss, or Varicose Veins. Social History:  reports that  she quit smoking about 5 weeks ago. Her smoking use included Cigarettes. She has a 4 pack-year smoking history. She has never used smokeless tobacco. She reports that she does not drink alcohol or use illicit drugs.   Prenatal Transfer Tool  Maternal Diabetes: No Genetic Screening: Declined Maternal Ultrasounds/Referrals: Normal - circumvallate placenta Fetal Ultrasounds or other Referrals:  None Maternal Substance Abuse:  No Significant Maternal Medications:  None Significant Maternal Lab Results:  Lab values include: Group B Strep negative Other Comments:  None  Review of Systems  Constitutional: Negative for fever, chills, weight loss and malaise/fatigue.  Eyes: Negative for blurred vision and double vision.  Respiratory: Negative for shortness of breath.   Cardiovascular: Negative for chest pain.  Gastrointestinal: Negative for heartburn, nausea, vomiting and abdominal pain.  Genitourinary: Negative for dysuria.  Musculoskeletal: Positive for back pain.  Skin: Negative for itching and rash.  Neurological: Negative for dizziness, tingling and headaches.  Psychiatric/Behavioral: Negative for depression, suicidal ideas, hallucinations and substance abuse. The patient is not nervous/anxious.       Last menstrual period 06/20/2015. Maternal Exam:  Abdomen: Patient reports no abdominal tenderness. Surgical scars: low transverse.   Estimated fetal weight is AGA.   Fetal presentation: vertex  Introitus: Normal vulva. Normal vagina.    Physical Exam  Constitutional: She is oriented to person, place, and time. She appears well-developed and well-nourished.  Neck: Normal range of motion.  Respiratory: Effort normal.  GI: Soft.  Genitourinary: Uterus normal.  Musculoskeletal: Normal range of motion.  She exhibits no edema or tenderness.  Neurological: She is alert and oriented to person, place, and time.  Skin: Skin is warm.  Psychiatric: She has a normal mood and affect. Her  behavior is normal. Judgment and thought content normal.    Prenatal labs: ABO, Rh: B/Positive/-- (11/02 0000) Antibody: Negative (11/02 0000) Rubella: Immune (11/02 0000) RPR: Nonreactive (11/02 0000)  HBsAg: Negative (11/02 0000)  HIV: Non-reactive (11/02 0000)  GBS: Negative (11/02 0000)   Assessment/Plan: G2P1001 at 39+ weeks for repeat cesarean delivery-stable Risks/benefits alternatives discussed; including risk of infection, bleeding and injury or organs Consent obtained after all questions answered To OR for repeat c/s   Angela Henson 03/20/2016, 8:23 AM

## 2016-03-21 LAB — RPR: RPR Ser Ql: NONREACTIVE

## 2016-03-22 ENCOUNTER — Inpatient Hospital Stay (HOSPITAL_COMMUNITY): Payer: Medicaid Other | Admitting: Anesthesiology

## 2016-03-22 ENCOUNTER — Encounter (HOSPITAL_COMMUNITY): Payer: Self-pay

## 2016-03-22 ENCOUNTER — Inpatient Hospital Stay (HOSPITAL_COMMUNITY)
Admission: RE | Admit: 2016-03-22 | Discharge: 2016-03-24 | DRG: 766 | Disposition: A | Discharge status: Home / Self Care | Payer: Medicaid Other | Source: Ambulatory Visit | Attending: Obstetrics and Gynecology | Admitting: Obstetrics and Gynecology

## 2016-03-22 ENCOUNTER — Encounter (HOSPITAL_COMMUNITY)
Admission: RE | Disposition: A | Discharge status: Home / Self Care | Payer: Self-pay | Source: Ambulatory Visit | Attending: Obstetrics and Gynecology

## 2016-03-22 DIAGNOSIS — Z833 Family history of diabetes mellitus: Secondary | ICD-10-CM | POA: Diagnosis not present

## 2016-03-22 DIAGNOSIS — Z87891 Personal history of nicotine dependence: Secondary | ICD-10-CM | POA: Diagnosis not present

## 2016-03-22 DIAGNOSIS — O34211 Maternal care for low transverse scar from previous cesarean delivery: Principal | ICD-10-CM | POA: Diagnosis present

## 2016-03-22 DIAGNOSIS — Z3A39 39 weeks gestation of pregnancy: Secondary | ICD-10-CM

## 2016-03-22 DIAGNOSIS — Z029 Encounter for administrative examinations, unspecified: Secondary | ICD-10-CM | POA: Diagnosis not present

## 2016-03-22 DIAGNOSIS — Z3490 Encounter for supervision of normal pregnancy, unspecified, unspecified trimester: Secondary | ICD-10-CM

## 2016-03-22 DIAGNOSIS — Z98891 History of uterine scar from previous surgery: Secondary | ICD-10-CM

## 2016-03-22 SURGERY — Surgical Case
Anesthesia: Spinal

## 2016-03-22 MED ORDER — LACTATED RINGERS IV SOLN
INTRAVENOUS | Status: DC
  Administered 2016-03-22 (×2): via INTRAVENOUS

## 2016-03-22 MED ORDER — ACETAMINOPHEN 500 MG PO TABS
1000.0000 mg | ORAL_TABLET | Freq: Four times a day (QID) | ORAL | Status: AC
Start: 2016-03-22 — End: 2016-03-23
  Administered 2016-03-22 – 2016-03-23 (×4): 1000 mg via ORAL
  Filled 2016-03-22 (×3): qty 2

## 2016-03-22 MED ORDER — COCONUT OIL OIL: 1.0000 "application " | TOPICAL_OIL | Status: DC | PRN

## 2016-03-22 MED ORDER — OXYTOCIN 40 UNITS IN LACTATED RINGERS INFUSION - SIMPLE MED: 2.5000 [IU]/h | INTRAVENOUS | Status: AC

## 2016-03-22 MED ORDER — ONDANSETRON HCL 4 MG/2ML IJ SOLN
INTRAMUSCULAR | Status: DC | PRN
  Administered 2016-03-22: 4 mg via INTRAVENOUS

## 2016-03-22 MED ORDER — IBUPROFEN 600 MG PO TABS
600.0000 mg | ORAL_TABLET | Freq: Four times a day (QID) | ORAL | Status: DC
  Administered 2016-03-22 – 2016-03-24 (×8): 600 mg via ORAL
  Filled 2016-03-22 (×9): qty 1

## 2016-03-22 MED ORDER — ZOLPIDEM TARTRATE 5 MG PO TABS: 5.0000 mg | ORAL_TABLET | Freq: Every evening | ORAL | Status: DC | PRN

## 2016-03-22 MED ORDER — LACTATED RINGERS IV SOLN
INTRAVENOUS | Status: DC
  Administered 2016-03-22: 999 mL via INTRAVENOUS

## 2016-03-22 MED ORDER — PHENYLEPHRINE 8 MG IN D5W 100 ML (0.08MG/ML) PREMIX OPTIME
INJECTION | INTRAVENOUS | Status: DC | PRN
  Administered 2016-03-22: 60 ug/min via INTRAVENOUS

## 2016-03-22 MED ORDER — LACTATED RINGERS IV SOLN
INTRAVENOUS | Status: DC
  Administered 2016-03-22: 09:00:00 via INTRAVENOUS

## 2016-03-22 MED ORDER — SOD CITRATE-CITRIC ACID 500-334 MG/5ML PO SOLN
ORAL | Status: AC
  Filled 2016-03-22: qty 15

## 2016-03-22 MED ORDER — MEPERIDINE HCL 25 MG/ML IJ SOLN
INTRAMUSCULAR | Status: AC
  Filled 2016-03-22: qty 1

## 2016-03-22 MED ORDER — OXYTOCIN 10 UNIT/ML IJ SOLN
INTRAMUSCULAR | Status: AC
  Filled 2016-03-22: qty 4

## 2016-03-22 MED ORDER — CEFAZOLIN SODIUM-DEXTROSE 2-4 GM/100ML-% IV SOLN: 2.0000 g | INTRAVENOUS | Status: DC

## 2016-03-22 MED ORDER — NALBUPHINE HCL 10 MG/ML IJ SOLN: 5.0000 mg | Freq: Once | INTRAMUSCULAR | Status: DC | PRN

## 2016-03-22 MED ORDER — NALOXONE HCL 0.4 MG/ML IJ SOLN: 0.4000 mg | INTRAMUSCULAR | Status: DC | PRN

## 2016-03-22 MED ORDER — NALBUPHINE HCL 10 MG/ML IJ SOLN: 5.0000 mg | INTRAMUSCULAR | Status: DC | PRN

## 2016-03-22 MED ORDER — MORPHINE SULFATE (PF) 0.5 MG/ML IJ SOLN
INTRAMUSCULAR | Status: DC | PRN
  Administered 2016-03-22: .2 mg via INTRATHECAL

## 2016-03-22 MED ORDER — LACTATED RINGERS IV SOLN
INTRAVENOUS | Status: DC | PRN
  Administered 2016-03-22: 10:00:00 via INTRAVENOUS

## 2016-03-22 MED ORDER — SIMETHICONE 80 MG PO CHEW
80.0000 mg | CHEWABLE_TABLET | ORAL | Status: DC
  Administered 2016-03-23 – 2016-03-24 (×2): 80 mg via ORAL
  Filled 2016-03-22 (×2): qty 1

## 2016-03-22 MED ORDER — PRENATAL MULTIVITAMIN CH
1.0000 | ORAL_TABLET | Freq: Every day | ORAL | Status: DC
  Administered 2016-03-23: 1 via ORAL
  Filled 2016-03-22 (×2): qty 1

## 2016-03-22 MED ORDER — SIMETHICONE 80 MG PO CHEW
80.0000 mg | CHEWABLE_TABLET | Freq: Three times a day (TID) | ORAL | Status: DC
  Administered 2016-03-22 – 2016-03-24 (×7): 80 mg via ORAL
  Filled 2016-03-22 (×7): qty 1

## 2016-03-22 MED ORDER — FENTANYL CITRATE (PF) 100 MCG/2ML IJ SOLN
INTRAMUSCULAR | Status: AC
  Administered 2016-03-22: 25 ug
  Filled 2016-03-22: qty 2

## 2016-03-22 MED ORDER — FENTANYL CITRATE (PF) 100 MCG/2ML IJ SOLN
INTRAMUSCULAR | Status: DC | PRN
  Administered 2016-03-22: 25 ug via INTRAVENOUS
  Administered 2016-03-22: 10 ug via INTRATHECAL
  Administered 2016-03-22: 40 ug via INTRAVENOUS
  Administered 2016-03-22: 25 ug via INTRAVENOUS

## 2016-03-22 MED ORDER — OXYCODONE HCL 5 MG PO TABS
10.0000 mg | ORAL_TABLET | ORAL | Status: DC | PRN
  Administered 2016-03-23 – 2016-03-24 (×5): 10 mg via ORAL
  Filled 2016-03-22 (×5): qty 2

## 2016-03-22 MED ORDER — SODIUM CHLORIDE 0.9% FLUSH: 3.0000 mL | INTRAVENOUS | Status: DC | PRN

## 2016-03-22 MED ORDER — DIPHENHYDRAMINE HCL 25 MG PO CAPS
25.0000 mg | ORAL_CAPSULE | ORAL | Status: DC | PRN
  Administered 2016-03-22 – 2016-03-23 (×2): 25 mg via ORAL
  Filled 2016-03-22 (×3): qty 1

## 2016-03-22 MED ORDER — SENNOSIDES-DOCUSATE SODIUM 8.6-50 MG PO TABS
2.0000 | ORAL_TABLET | ORAL | Status: DC
  Administered 2016-03-23 – 2016-03-24 (×2): 2 via ORAL
  Filled 2016-03-22 (×2): qty 2

## 2016-03-22 MED ORDER — MENTHOL 3 MG MT LOZG
1.0000 | LOZENGE | OROMUCOSAL | Status: DC | PRN
Start: 2016-03-22 — End: 2016-03-24

## 2016-03-22 MED ORDER — ACETAMINOPHEN 325 MG PO TABS
650.0000 mg | ORAL_TABLET | ORAL | Status: DC | PRN
  Administered 2016-03-23 (×3): 650 mg via ORAL
  Filled 2016-03-22 (×2): qty 2

## 2016-03-22 MED ORDER — SCOPOLAMINE 1 MG/3DAYS TD PT72
1.0000 | MEDICATED_PATCH | Freq: Once | TRANSDERMAL | Status: DC
  Administered 2016-03-22: 1.5 mg via TRANSDERMAL

## 2016-03-22 MED ORDER — BUPIVACAINE IN DEXTROSE 0.75-8.25 % IT SOLN
INTRATHECAL | Status: DC | PRN
Start: 2016-03-22 — End: 2016-03-22
  Administered 2016-03-22: 1.6 mL via INTRATHECAL

## 2016-03-22 MED ORDER — MEPERIDINE HCL 25 MG/ML IJ SOLN: 6.2500 mg | INTRAMUSCULAR | Status: DC | PRN

## 2016-03-22 MED ORDER — SOD CITRATE-CITRIC ACID 500-334 MG/5ML PO SOLN
30.0000 mL | ORAL | Status: AC
  Administered 2016-03-22: 30 mL via ORAL

## 2016-03-22 MED ORDER — DIBUCAINE 1 % RE OINT: 1.0000 "application " | TOPICAL_OINTMENT | RECTAL | Status: DC | PRN

## 2016-03-22 MED ORDER — LIDOCAINE HCL 1 % IJ SOLN
INTRAMUSCULAR | Status: DC | PRN
  Administered 2016-03-22: 10 mL

## 2016-03-22 MED ORDER — MEPERIDINE HCL 25 MG/ML IJ SOLN
INTRAMUSCULAR | Status: DC | PRN
  Administered 2016-03-22: 12.5 mg via INTRAVENOUS

## 2016-03-22 MED ORDER — LIDOCAINE HCL 1 % IJ SOLN
INTRAMUSCULAR | Status: AC
  Filled 2016-03-22: qty 20

## 2016-03-22 MED ORDER — WITCH HAZEL-GLYCERIN EX PADS
1.0000 | MEDICATED_PAD | CUTANEOUS | Status: DC | PRN
Start: 2016-03-22 — End: 2016-03-24

## 2016-03-22 MED ORDER — SIMETHICONE 80 MG PO CHEW: 80.0000 mg | CHEWABLE_TABLET | ORAL | Status: DC | PRN

## 2016-03-22 MED ORDER — OXYCODONE HCL 5 MG PO TABS
5.0000 mg | ORAL_TABLET | ORAL | Status: DC | PRN
  Administered 2016-03-22 – 2016-03-23 (×5): 5 mg via ORAL
  Filled 2016-03-22 (×5): qty 1

## 2016-03-22 MED ORDER — MORPHINE SULFATE (PF) 0.5 MG/ML IJ SOLN
INTRAMUSCULAR | Status: AC
  Filled 2016-03-22: qty 10

## 2016-03-22 MED ORDER — ONDANSETRON HCL 4 MG/2ML IJ SOLN
INTRAMUSCULAR | Status: AC
  Filled 2016-03-22: qty 2

## 2016-03-22 MED ORDER — DIPHENHYDRAMINE HCL 50 MG/ML IJ SOLN: 12.5000 mg | INTRAMUSCULAR | Status: DC | PRN

## 2016-03-22 MED ORDER — NALOXONE HCL 2 MG/2ML IJ SOSY: 1.0000 ug/kg/h | PREFILLED_SYRINGE | INTRAMUSCULAR | Status: DC | PRN

## 2016-03-22 MED ORDER — MIDAZOLAM HCL 2 MG/2ML IJ SOLN
INTRAMUSCULAR | Status: AC
  Filled 2016-03-22: qty 2

## 2016-03-22 MED ORDER — MIDAZOLAM HCL 5 MG/5ML IJ SOLN
INTRAMUSCULAR | Status: DC | PRN
  Administered 2016-03-22: 1 mg via INTRAVENOUS

## 2016-03-22 MED ORDER — FERROUS SULFATE 325 (65 FE) MG PO TABS
325.0000 mg | ORAL_TABLET | Freq: Two times a day (BID) | ORAL | Status: DC
  Administered 2016-03-22 – 2016-03-24 (×4): 325 mg via ORAL
  Filled 2016-03-22 (×4): qty 1

## 2016-03-22 MED ORDER — NALBUPHINE HCL 10 MG/ML IJ SOLN
5.0000 mg | INTRAMUSCULAR | Status: DC | PRN
  Administered 2016-03-22: 5 mg via SUBCUTANEOUS
  Filled 2016-03-22: qty 1

## 2016-03-22 MED ORDER — FENTANYL CITRATE (PF) 100 MCG/2ML IJ SOLN
INTRAMUSCULAR | Status: AC
  Filled 2016-03-22: qty 2

## 2016-03-22 MED ORDER — SODIUM CHLORIDE 0.9 % IR SOLN
Status: DC | PRN
  Administered 2016-03-22: 1

## 2016-03-22 MED ORDER — DIPHENHYDRAMINE HCL 25 MG PO CAPS
25.0000 mg | ORAL_CAPSULE | Freq: Four times a day (QID) | ORAL | Status: DC | PRN
  Administered 2016-03-24: 25 mg via ORAL

## 2016-03-22 MED ORDER — PHENYLEPHRINE 8 MG IN D5W 100 ML (0.08MG/ML) PREMIX OPTIME
INJECTION | INTRAVENOUS | Status: AC
  Filled 2016-03-22: qty 100

## 2016-03-22 MED ORDER — OXYTOCIN 10 UNIT/ML IJ SOLN
40.0000 [IU] | INTRAVENOUS | Status: DC | PRN
  Administered 2016-03-22: 40 [IU] via INTRAVENOUS

## 2016-03-22 MED ORDER — CEFAZOLIN SODIUM-DEXTROSE 2-4 GM/100ML-% IV SOLN
2.0000 g | INTRAVENOUS | Status: AC
  Administered 2016-03-22: 2 g via INTRAVENOUS

## 2016-03-22 MED ORDER — FENTANYL CITRATE (PF) 100 MCG/2ML IJ SOLN
25.0000 ug | Freq: Once | INTRAMUSCULAR | Status: AC
  Administered 2016-03-22: 25 ug via INTRAVENOUS

## 2016-03-22 MED ORDER — ONDANSETRON HCL 4 MG/2ML IJ SOLN: 4.0000 mg | Freq: Three times a day (TID) | INTRAMUSCULAR | Status: DC | PRN

## 2016-03-22 MED ORDER — SCOPOLAMINE 1 MG/3DAYS TD PT72
MEDICATED_PATCH | TRANSDERMAL | Status: AC
  Administered 2016-03-22: 1.5 mg via TRANSDERMAL
  Filled 2016-03-22: qty 1

## 2016-03-22 MED ORDER — TETANUS-DIPHTH-ACELL PERTUSSIS 5-2.5-18.5 LF-MCG/0.5 IM SUSP: 0.5000 mL | Freq: Once | INTRAMUSCULAR | Status: DC

## 2016-03-22 MED ORDER — ACETAMINOPHEN 500 MG PO TABS
ORAL_TABLET | ORAL | Status: AC
  Filled 2016-03-22: qty 2

## 2016-03-22 SURGICAL SUPPLY — 41 items
APL SKNCLS STERI-STRIP NONHPOA (GAUZE/BANDAGES/DRESSINGS) ×1
BENZOIN TINCTURE PRP APPL 2/3 (GAUZE/BANDAGES/DRESSINGS) ×2 IMPLANT
CHLORAPREP W/TINT 26ML (MISCELLANEOUS) ×3 IMPLANT
CLAMP CORD UMBIL (MISCELLANEOUS) ×2 IMPLANT
CLOSURE STERI STRIP 1/2 X4 (GAUZE/BANDAGES/DRESSINGS) ×2 IMPLANT
CLOSURE WOUND 1/2 X4 (GAUZE/BANDAGES/DRESSINGS)
CLOTH BEACON ORANGE TIMEOUT ST (SAFETY) ×3 IMPLANT
DRAPE C SECTION CLR SCREEN (DRAPES) ×3 IMPLANT
DRSG OPSITE POSTOP 4X10 (GAUZE/BANDAGES/DRESSINGS) ×3 IMPLANT
DRSG PAD ABDOMINAL 8X10 ST (GAUZE/BANDAGES/DRESSINGS) ×4 IMPLANT
ELECT REM PT RETURN 9FT ADLT (ELECTROSURGICAL) ×3
ELECTRODE REM PT RTRN 9FT ADLT (ELECTROSURGICAL) ×1 IMPLANT
EXTRACTOR VACUUM KIWI (MISCELLANEOUS) ×2 IMPLANT
GLOVE BIO SURGEON STRL SZ 6.5 (GLOVE) ×2 IMPLANT
GLOVE BIO SURGEONS STRL SZ 6.5 (GLOVE) ×1
GLOVE BIOGEL PI IND STRL 7.0 (GLOVE) ×1 IMPLANT
GLOVE BIOGEL PI INDICATOR 7.0 (GLOVE) ×2
GOWN STRL REUS W/TWL LRG LVL3 (GOWN DISPOSABLE) ×6 IMPLANT
KIT ABG SYR 3ML LUER SLIP (SYRINGE) IMPLANT
NDL HYPO 25X5/8 SAFETYGLIDE (NEEDLE) IMPLANT
NEEDLE HYPO 25X5/8 SAFETYGLIDE (NEEDLE) IMPLANT
NS IRRIG 1000ML POUR BTL (IV SOLUTION) ×3 IMPLANT
PACK C SECTION WH (CUSTOM PROCEDURE TRAY) ×3 IMPLANT
PAD OB MATERNITY 4.3X12.25 (PERSONAL CARE ITEMS) ×3 IMPLANT
PENCIL SMOKE EVAC W/HOLSTER (ELECTROSURGICAL) ×3 IMPLANT
RTRCTR C-SECT PINK 25CM LRG (MISCELLANEOUS) ×2 IMPLANT
SPONGE GAUZE 4X4 12PLY STER LF (GAUZE/BANDAGES/DRESSINGS) ×2 IMPLANT
STRIP CLOSURE SKIN 1/2X4 (GAUZE/BANDAGES/DRESSINGS) IMPLANT
SUT CHROMIC 1 CTX 36 (SUTURE) ×6 IMPLANT
SUT PLAIN 0 NONE (SUTURE) IMPLANT
SUT PLAIN 2 0 XLH (SUTURE) ×3 IMPLANT
SUT VIC AB 0 CT1 27 (SUTURE) ×6
SUT VIC AB 0 CT1 27XBRD ANBCTR (SUTURE) ×2 IMPLANT
SUT VIC AB 2-0 CT1 27 (SUTURE) ×3
SUT VIC AB 2-0 CT1 TAPERPNT 27 (SUTURE) ×1 IMPLANT
SUT VIC AB 3-0 CT1 27 (SUTURE)
SUT VIC AB 3-0 CT1 TAPERPNT 27 (SUTURE) IMPLANT
SUT VIC AB 4-0 KS 27 (SUTURE) ×3 IMPLANT
TAPE CLOTH SURG 4X10 WHT LF (GAUZE/BANDAGES/DRESSINGS) ×2 IMPLANT
TOWEL OR 17X24 6PK STRL BLUE (TOWEL DISPOSABLE) ×3 IMPLANT
TRAY FOLEY CATH SILVER 14FR (SET/KITS/TRAYS/PACK) ×3 IMPLANT

## 2016-03-22 NOTE — Op Note (Addendum)
Operative Note    Preoperative Diagnosis: IUP at 39+ weeks; prior c/s   Postoperative Diagnosis: same    Procedure: Repeat low transverse c/s   Surgeon: Dr Mindi SlickerBanga  Anesthesia: Spinal; 1% lidocaine  Fluids: LR EBL: 500ml UOP: clear   Findings: Mild adhesions to anterior uterus; grossly normal uterus, tubes and ovaries. Viable vertex female infant   Specimen: placenta to l/d   Procedure Note  Patient was taken to the operating room where spinal anesthesia was administered and found to be adequate. FHTs confirmed .  She was prepped and draped in the normal sterile fashion in the dorsal supine position with a leftward tilt. An appropriate time out was performed. A Pfannenstiel skin incision was then made, after adequate allis clamp test, through her previous incision which was 2 finger breaths above the pubic symphysis and carried through to the underlying layer of fascia by sharp dissection. The fascia was nicked in the midline and the incision was extended laterally with Mayo scissors. The inferior aspect of the incision was grasped Coker clamps and dissected off the underlying rectus muscles. In a similar fashion the superior aspect was dissected off the rectus muscles. Rectus muscles were separated in the midline and the peritoneal cavity entered bluntly. The peritoneal incision was then extended both superiorly and inferiorly with careful attention to avoid both bowel and bladder. A moderate adhesion was then reduced off the anterior uterine wall with the bovie. The Alexis self-retaining wound retractor was then placed within the incision and the lower uterine segment exposed. The bladder flap was developed with Metzenbaum scissors and pushed away from the lower uterine segment. The lower uterine segment was then incised in a transverse fashion and the cavity itself entered bluntly. The incision was extended bluntly and attempt made to deliver infant. Infants head was low and due to pts  small frame kiwi was applied to assist with delivery of infants head. No pop offs; only one application.  The remainder of the infant delivered easily and the nose and mouth bulb suctioned with the cord clamped and cut after 1 minute delay. The infant was handed off to the waiting pediatricians. The placenta was then spontaneously expressed from the uterus and the uterus cleared of all clots and debris with moist lap sponge. The uterine incision was then repaired in 1 running locked layer using 0 chromic - a second layer was not indicated but a small layer of bleeding was suture ligated with two figure 8 stitches with excellent hemostasis. The tubes and ovaries were inspected and the gutters cleared of all clots and debris. The uterine incision was inspected again and found to be hemostatic. All instruments and sponges as well as the Alexis retractor were then removed from the abdomen. The peritoneum was then reapproximated in a purse string fashion and the rectus muscle in a loose figure 8 with 2-0 Vicryl. The fascia was then closed with 0 Vicryl in a running fashion. The subcutaneous tissue was <0.5cm in depth - no reapproximation required.  The skin was closed with a subcuticular stitch of 4-0 Vicryl on a Keith needle. 1% lidocaine was injection along the incision to further help with pain control. Incision was reinforced with a honeycomb and pressure dressing. At the conclusion of the procedure all instruments and sponge counts were correct. Patient was taken to the recovery room in good condition with her baby accompanying her skin to skin.

## 2016-03-22 NOTE — Addendum Note (Signed)
Addendum  created 03/22/16 2232 by Graciela HusbandsWynn O Jahzeel Poythress, CRNA   Modules edited: Notes Section   Notes Section:  File: [id number]

## 2016-03-22 NOTE — Anesthesia Postprocedure Evaluation (Signed)
Anesthesia Post Note  Patient: Angela Henson  Procedure(s) Performed: Procedure(s) (LRB): REPEAT CESAREAN SECTION (N/A)  Patient location during evaluation: PACU Anesthesia Type: Spinal Level of consciousness: oriented and awake and alert Pain management: pain level controlled Vital Signs Assessment: post-procedure vital signs reviewed and stable Respiratory status: spontaneous breathing, respiratory function stable and patient connected to nasal cannula oxygen Cardiovascular status: blood pressure returned to baseline and stable Postop Assessment: no headache, no backache, spinal receding, patient able to bend at knees and no signs of nausea or vomiting Anesthetic complications: no     Last Vitals:  Filed Vitals:   03/22/16 1415 03/22/16 1500  BP: 113/65 115/68  Pulse: 72 62  Temp: 36.7 C 36.7 C  Resp: 20 20    Last Pain:  Filed Vitals:   03/22/16 1534  PainSc: 6    Pain Goal:                 Cecile HearingStephen Edward Turk

## 2016-03-22 NOTE — Progress Notes (Signed)
Patient ID: Angela Henson, female   DOB: 04/12/1991, 25 y.o.   MRN: [id number] Pt c/o pain post-op, wants percocet but too early for that with duramorph on board  Will begin oxycodone at 10pm, explained to patient why Has tylenol and ibuprofen Per RN some question of h/o drug abuse in old records, nursery aware

## 2016-03-22 NOTE — Anesthesia Procedure Notes (Signed)
Spinal Patient location during procedure: OR Staffing Anesthesiologist: Rykker Coviello EDWARD Performed by: anesthesiologist  Preanesthetic Checklist Completed: patient identified, surgical consent, pre-op evaluation, timeout performed, IV checked, risks and benefits discussed and monitors and equipment checked Spinal Block Patient position: sitting Prep: site prepped and draped and DuraPrep Patient monitoring: continuous pulse ox and blood pressure Approach: midline Location: L3-4 Needle Needle type: Pencan  Needle gauge: 24 G Needle length: 9 cm Assessment Sensory level: T4 Additional Notes Functioning IV was confirmed and monitors were applied. Sterile prep and drape, including hand hygiene, mask and sterile gloves were used. The patient was positioned and the spine was prepped. The skin was anesthetized with lidocaine.  Free flow of clear CSF was obtained prior to injecting local anesthetic into the CSF.  The spinal needle aspirated freely following injection.  The needle was carefully withdrawn.  The patient tolerated the procedure well. Consent was obtained prior to procedure with all questions answered and concerns addressed. Risks including but not limited to bleeding, infection, nerve damage, paralysis, failed block, inadequate analgesia, allergic reaction, high spinal, itching and headache were discussed and the patient wished to proceed.   Margee Trentham, MD   

## 2016-03-22 NOTE — Transfer of Care (Signed)
Immediate Anesthesia Transfer of Care Note  Patient: Angela OtoDebra L Henson  Procedure(s) Performed: Procedure(s) with comments: REPEAT CESAREAN SECTION (N/A) - Heather K to RNFA- confirmed  Patient Location: PACU  Anesthesia Type:Spinal  Level of Consciousness: awake, alert , oriented and patient cooperative  Airway & Oxygen Therapy: Patient Spontanous Breathing  Post-op Assessment: Report given to RN and Post -op Vital signs reviewed and stable  Post vital signs: Reviewed and stable  Last Vitals:  Filed Vitals:   03/22/16 0813  BP: 109/75  Pulse: 87  Temp: 37 C  Resp: 18    Last Pain: There were no vitals filed for this visit.       Complications: No apparent anesthesia complications

## 2016-03-22 NOTE — Anesthesia Preprocedure Evaluation (Addendum)
Anesthesia Evaluation  Patient identified by MRN, date of birth, ID band Patient awake    Reviewed: Allergy & Precautions, NPO status , Patient's Chart, lab work & pertinent test results  History of Anesthesia Complications Negative for: history of anesthetic complications  Airway Mallampati: I  TM Distance: >3 FB Neck ROM: Full    Dental  (+) Teeth Intact, Dental Advisory Given   Pulmonary former smoker,    Pulmonary exam normal breath sounds clear to auscultation       Cardiovascular negative cardio ROS Normal cardiovascular exam Rhythm:Regular Rate:Normal     Neuro/Psych  Headaches, PSYCHIATRIC DISORDERS Depression    GI/Hepatic negative GI ROS, (+)     substance abuse  ,   Endo/Other  negative endocrine ROS  Renal/GU negative Renal ROS     Musculoskeletal negative musculoskeletal ROS (+)   Abdominal   Peds  Hematology  (+) Blood dyscrasia, anemia , Plt 271k   Anesthesia Other Findings Day of surgery medications reviewed with the patient.  Reproductive/Obstetrics (+) Pregnancy H/o C-section x1                           Anesthesia Physical Anesthesia Plan  ASA: II  Anesthesia Plan: Spinal   Post-op Pain Management:    Induction:   Airway Management Planned:   Additional Equipment:   Intra-op Plan:   Post-operative Plan:   Informed Consent: I have reviewed the patients History and Physical, chart, labs and discussed the procedure including the risks, benefits and alternatives for the proposed anesthesia with the patient or authorized representative who has indicated his/her understanding and acceptance.   Dental advisory given  Plan Discussed with: CRNA, Anesthesiologist and Surgeon  Anesthesia Plan Comments: (Discussed risks and benefits of and differences between spinal and general. Discussed risks of spinal including headache, backache, failure, bleeding, infection,  and nerve damage. Patient consents to spinal. Questions answered. Coagulation studies and platelet count acceptable.)        Anesthesia Quick Evaluation

## 2016-03-22 NOTE — Anesthesia Postprocedure Evaluation (Signed)
Anesthesia Post Note  Patient: Angela OtoDebra L Henson  Procedure(s) Performed: Procedure(s) (LRB): REPEAT CESAREAN SECTION (N/A)  Patient location during evaluation: Mother Baby Anesthesia Type: Spinal Level of consciousness: awake and alert and oriented Pain management: satisfactory to patient Vital Signs Assessment: post-procedure vital signs reviewed and stable Respiratory status: respiratory function stable and spontaneous breathing Cardiovascular status: blood pressure returned to baseline Postop Assessment: no headache, no backache, spinal receding, patient able to bend at knees and adequate PO intake Anesthetic complications: no     Last Vitals:  Filed Vitals:   03/22/16 1821 03/22/16 2200  BP: 123/78 101/57  Pulse: 77 64  Temp:  36.7 C  Resp: 16     Last Pain:  Filed Vitals:   03/22/16 2223  PainSc: 7    Pain Goal: Patients Stated Pain Goal: 0 (03/22/16 1808)               Karleen DolphinFUSSELL,Suresh Audi

## 2016-03-22 NOTE — Interval H&P Note (Signed)
History and Physical Interval Note:  03/22/2016 8:52 AM  Angela Henson  has presented today for surgery, with the diagnosis of repeat c-section  The various methods of treatment have been discussed with the patient and family. After consideration of risks, benefits and other options for treatment, the patient has consented to  Procedure(s) with comments: REPEAT CESAREAN SECTION (N/A) - Heather K to RNFA- confirmed as a surgical intervention .  The patient's history has been reviewed, patient examined, no change in status, stable for surgery.  I have reviewed the patient's chart and labs.  Questions were answered to the patient's satisfaction.     Angela Henson

## 2016-03-23 LAB — CBC
HCT: 20.7 % — ABNORMAL LOW (ref 36.0–46.0)
Hemoglobin: 6.8 g/dL — CL (ref 12.0–15.0)
MCH: 25 pg — ABNORMAL LOW (ref 26.0–34.0)
MCHC: 32.9 g/dL (ref 30.0–36.0)
MCV: 76.1 fL — ABNORMAL LOW (ref 78.0–100.0)
PLATELETS: 258 10*3/uL (ref 150–400)
RBC: 2.72 MIL/uL — AB (ref 3.87–5.11)
RDW: 15.2 % (ref 11.5–15.5)
WBC: 13.9 10*3/uL — ABNORMAL HIGH (ref 4.0–10.5)

## 2016-03-23 LAB — BIRTH TISSUE RECOVERY COLLECTION (PLACENTA DONATION)

## 2016-03-23 MED ORDER — MEDROXYPROGESTERONE ACETATE 150 MG/ML IM SUSP
150.0000 mg | Freq: Once | INTRAMUSCULAR | Status: AC
  Administered 2016-03-24: 150 mg via INTRAMUSCULAR
  Filled 2016-03-23: qty 1

## 2016-03-23 NOTE — Progress Notes (Signed)
Subjective: Postpartum Day 1: Cesarean Delivery Patient reports tolerating PO, + flatus and no problems voiding.  She is bonding wellw ith baby- bottlefeeding. Has no complaints. Denies any lightheadedness or dizziness  Objective: Vital signs in last 24 hours: Temp:  [97.8 F (36.6 C)-98.2 F (36.8 C)] 98 F (36.7 C) (05/31 0742) Pulse Rate:  [62-101] 75 (05/31 0742) Resp:  [13-25] 16 (05/30 1821) BP: (101-123)/(56-81) 111/60 mmHg (05/31 0742) SpO2:  [99 %-100 %] 99 % (05/31 0742) Weight:  [127 lb (57.607 kg)] 127 lb (57.607 kg) (05/30 1204)  Physical Exam:  General: alert, cooperative and no distress Lochia: appropriate Uterine Fundus: firm Incision: no significant drainage DVT Evaluation: No evidence of DVT seen on physical exam. No significant calf/ankle edema.   Recent Labs  03/20/16 0957 03/23/16 0518  HGB 9.1* 6.8*  HCT 28.8* 20.7*    Assessment/Plan: Status post Cesarean section. Doing well postoperatively.  Continue current care Depo provera prior to discharge.  Angela Henson 03/23/2016, 8:51 AM

## 2016-03-23 NOTE — Addendum Note (Signed)
Addendum  created 03/23/16 1002 by Shanon PayorSuzanne M Regino Fournet, CRNA   Modules edited: Clinical Notes   Clinical Notes:  File: [id number]

## 2016-03-23 NOTE — Anesthesia Postprocedure Evaluation (Signed)
Anesthesia Post Note  Patient: Jessy OtoDebra L Buckner  Procedure(s) Performed: Procedure(s) (LRB): REPEAT CESAREAN SECTION (N/A)  Patient location during evaluation: Mother Baby Anesthesia Type: Spinal Level of consciousness: awake and alert and oriented Pain management: satisfactory to patient Vital Signs Assessment: post-procedure vital signs reviewed and stable Respiratory status: spontaneous breathing and nonlabored ventilation Cardiovascular status: stable Postop Assessment: no headache, no backache, patient able to bend at knees, no signs of nausea or vomiting and adequate PO intake Anesthetic complications: no     Last Vitals:  Filed Vitals:   03/23/16 0742 03/23/16 0900  BP: 111/60 127/61  Pulse: 75 77  Temp: 36.7 C 36.8 C  Resp:  18    Last Pain:  Filed Vitals:   03/23/16 0931  PainSc: 4    Pain Goal: Patients Stated Pain Goal: 4 (03/23/16 0900)               Madison HickmanGREGORY,Evyn Kooyman

## 2016-03-23 NOTE — Progress Notes (Signed)
CRITICAL VALUE ALERT  Critical value received:  Yes   Date of notification:  03/23/2016  Time of notification:  0608  Critical value read back:yes    Nurse who received alert:  Theda SersJan Aymen Widrig, RN  MD notified (1st page):  Dr Senaida Oresichardson  Time of first page:  0615   MD notified (2nd page): none  Time of second page: none   Responding MD:  Dr Senaida Oresichardson  Time MD responded:  [phone]

## 2016-03-23 NOTE — Clinical Social Work Maternal (Signed)
CLINICAL SOCIAL WORK MATERNAL/CHILD NOTE  Patient Details  Name: Angela Henson MRN: [id number] Date of Birth: 01-Aug-1991  Date:  03/23/2016  Clinical Social Worker Initiating Note:  Laurey Arrow Date/ Time Initiated:  03/23/16/1501     Child's Name:      Legal Guardian:  Mother   Need for Interpreter:  None   Date of Referral:  03/23/16     Reason for Referral:  Behavioral Health Issues, including SI , Current Substance Use/Substance Use During Pregnancy    Referral Source:  [facility]   Address:  [place] [address]  Phone number:  [phone]   Household Members:  Siblings, Minor Children, Parents ([name] resides with her mother)   Natural Supports (not living in the home):  Immediate Family, Extended Family, Friends   Professional Supports: Case Metallurgist   Employment: Unemployed   Type of Work:     Education:  Database administrator Resources:  Medicaid   Other Resources:  Physicist, medical , [place]   Cultural/Religious Considerations Which May Impact Care:  None Reported  Strengths:  Ability to meet basic needs , Home prepared for child , Pediatrician chosen    Risk Factors/Current Problems:  Substance Use    Cognitive State:  Goal Oriented , Insightful , Linear Thinking    Mood/Affect:  Bright , Calm , Relaxed , Comfortable    CSW Assessment: CSW met with [name] to offer support and to complete an assessment due history of THC use and a history of depression.  [name] appeared to be sleeping but was adamant about CSW visiting now as opposed to returning at a later time.  [name] was inviting, honest, and engaged during the visit.  [name] expressed that she was feeling good after her C-section, and overall she and the baby were doing well.  CSW inquired about [name] supports and childcare for her oldest son Richardson Dopp; age 33) while she is hospitalized.  [name] communicated that she resides with her mother, and that her mother was  caring for her child until she is discharged.  [name] also stated that she feels supported by her best friend "Lovena Le". [name] shared that she and FOB are not in a relationship, and the extent of their relationship is a result of a one night stand, and her baby was conceived.  However, [name] stated that FOB has been to visit the baby twice since delivery.  CSW informed [name] of the hospital's drug screen policy, and encouraged [name] to ask question.  [name] admitted to using marijuana during pregnancy, and communicate that her usage was "only to increase my appetite".  [name] denied any other substance use  (prescribed or illegal), and she denied any prior history with CPS.  [name] expressed understanding of the hospital policy, and communicated "I went through the same thing when I had my first son here". CSW inquired about [name] mental health and educated [name] about PPD. [name] appeared knowledged about PPD and was able to recognize symptoms.  [name] was reminded of medical supports that will be able to assist her with PPD if needed. [name] also acknowledged her history of depression, and communicated that she has not had any symptoms or concerns in over 2 years.  [name] declined resources for SA and behavioral health treatment. [name] denied any SI and HI. CSW thanked [name] for her honesty, and willingness to meet with her.  [name] did not have any further questions, concerns, or needs at this time.  CSW Plan/Description:  Child Protective Service Report  (CPS report will be made if baby's UDS or cord sreen are positive)    Mako Pelfrey D BOYD-GILYARD, LCSW 03/23/2016, 3:06 PM.

## 2016-03-24 MED ORDER — FERROUS SULFATE 325 (65 FE) MG PO TABS: 325.0000 mg | ORAL_TABLET | Freq: Two times a day (BID) | ORAL | Status: DC

## 2016-03-24 MED ORDER — OXYCODONE-ACETAMINOPHEN 5-325 MG PO TABS: 1.0000 | ORAL_TABLET | ORAL | Status: DC | PRN

## 2016-03-24 MED ORDER — IBUPROFEN 600 MG PO TABS: 600.0000 mg | ORAL_TABLET | Freq: Four times a day (QID) | ORAL | Status: DC

## 2016-03-24 NOTE — Discharge Instructions (Signed)
Nothing in vagina for 6 weeks.  No sex, tampons, and douching.

## 2016-03-24 NOTE — Progress Notes (Signed)
Subjective: Postpartum Day 2: Cesarean Delivery Patient reports tolerating PO, + flatus, + BM and no problems voiding.    Objective: Vital signs in last 24 hours: Temp:  [98.2 F (36.8 C)-98.9 F (37.2 C)] 98.2 F (36.8 C) (06/01 0618) Pulse Rate:  [77-100] 84 (06/01 0618) Resp:  [18] 18 (06/01 0618) BP: (123-127)/(61-69) 123/69 mmHg (06/01 0618) SpO2:  [100 %] 100 % (05/31 0900)  Physical Exam:  General: alert, cooperative and no distress Lochia: appropriate Uterine Fundus: firm Incision: no significant drainage, no significant erythema DVT Evaluation: No evidence of DVT seen on physical exam. No significant calf/ankle edema.   Recent Labs  03/23/16 0518  HGB 6.8*  HCT 20.7*    Assessment/Plan: Status post Cesarean section. Doing well postoperatively.  Discharge home with standard precautions and return to clinic in 2-6 weeks. Depo provera prior to discharge  Edwinna AreolaCecilia Worema Henson 03/24/2016, 8:42 AM

## 2016-03-28 ENCOUNTER — Encounter (HOSPITAL_COMMUNITY): Payer: Self-pay | Admitting: *Deleted

## 2016-04-12 NOTE — Discharge Summary (Signed)
OB Discharge Summary     Patient Name: Angela OtoDebra L Henson DOB: 04/16/1991 MRN: [id number]  Date of admission: 03/22/2016 Delivering MD: Pryor OchoaBANGA, CECILIA [facility]   Date of discharge: 04/12/2016.. 03/24/2016  Admitting diagnosis: repeat c-section Intrauterine pregnancy: 231w3d     Secondary diagnosis:  Active Problems:   Term pregnancy   History of cesarean delivery   Postpartum care following cesarean delivery  Additional problems: none     Discharge diagnosis: Term Pregnancy Delivered                                                                                                Post partum procedures:none  Augmentation: n/a  Complications: None  Hospital course:  Sceduled C/S   25 y.o. yo G2P1102 at 461w3d was admitted to the hospital 03/22/2016 for scheduled cesarean section with the following indication:Elective Repeat.  Membrane Rupture Time/Date: 9:44 AM ,03/22/2016   Patient delivered a Viable infant.03/22/2016  Details of operation can be found in separate operative note.  Pateint had an uncomplicated postpartum course.  She is ambulating, tolerating a regular diet, passing flatus, and urinating well. Patient is discharged home in stable condition on  04/12/2016          Physical exam  Filed Vitals:   03/23/16 0742 03/23/16 0900 03/23/16 1800 03/24/16 0618  BP: 111/60 127/61 126/69 123/69  Pulse: 75 77 100 84  Temp: 98 F (36.7 C) 98.2 F (36.8 C) 98.9 F (37.2 C) 98.2 F (36.8 C)  TempSrc: Oral  Oral Oral  Resp:  18 18 18   Height:      Weight:      SpO2: 99% 100%     General: alert, cooperative and no distress Lochia: appropriate Uterine Fundus: firm Incision: Dressing is clean, dry, and intact DVT Evaluation: No evidence of DVT seen on physical exam. Labs: Lab Results  Component Value Date   WBC 13.9* 03/23/2016   HGB 6.8* 03/23/2016   HCT 20.7* 03/23/2016   MCV 76.1* 03/23/2016   PLT 258 03/23/2016   CMP Latest Ref Rng 08/17/2015  Glucose 65 - 99 mg/dL 78   BUN 6 - 20 mg/dL 11  Creatinine 8.290.44 - 5.621.00 mg/dL 1.300.51  Sodium 865135 - 784145 mmol/L 134(L)  Potassium 3.5 - 5.1 mmol/L 4.2  Chloride 101 - 111 mmol/L 107  CO2 22 - 32 mmol/L 23  Calcium 8.9 - 10.3 mg/dL 9.1  Total Protein 6.5 - 8.1 g/dL 7.5  Total Bilirubin 0.3 - 1.2 mg/dL 0.5  Alkaline Phos 38 - 126 U/L 50  AST 15 - 41 U/L 17  ALT 14 - 54 U/L 12(L)    Discharge instruction: per After Visit Summary and "Baby and Me Booklet".  After visit meds:    Medication List    STOP taking these medications        acetaminophen 500 MG tablet  Commonly known as:  TYLENOL      TAKE these medications        ferrous sulfate 325 (65 FE) MG tablet  Take 1 tablet (325 mg total) by mouth 2 (two) times daily  with a meal.     ibuprofen 600 MG tablet  Commonly known as:  ADVIL,MOTRIN  Take 1 tablet (600 mg total) by mouth every 6 (six) hours.     oxyCODONE-acetaminophen 5-325 MG tablet  Commonly known as:  ROXICET  Take 1 tablet by mouth every 4 (four) hours as needed for severe pain.        Diet: routine diet  Activity: Advance as tolerated. Pelvic rest for 6 weeks.   Outpatient follow up:2 weeksfor incision check and 6weeks for pp visit Follow up Appt:No future appointments. Follow up Visit:No Follow-up on file.  Postpartum contraception: Depo Provera  Newborn Data: Live born female  Birth Weight: 6 lb 11.2 oz (3040 g) APGAR: 8, 9  Baby Feeding: Bottle Disposition:home with mother   04/12/2016 Sharol Given [employer], DO

## 2016-06-20 ENCOUNTER — Emergency Department (HOSPITAL_COMMUNITY)
Admission: EM | Admit: 2016-06-20 | Discharge: 2016-06-20 | Disposition: A | Discharge status: Home / Self Care | Payer: Medicaid Other | Attending: Dermatology | Admitting: Dermatology

## 2016-06-20 ENCOUNTER — Encounter (HOSPITAL_COMMUNITY): Payer: Self-pay

## 2016-06-20 DIAGNOSIS — Z5321 Procedure and treatment not carried out due to patient leaving prior to being seen by health care provider: Secondary | ICD-10-CM | POA: Diagnosis not present

## 2016-06-20 DIAGNOSIS — M549 Dorsalgia, unspecified: Secondary | ICD-10-CM | POA: Diagnosis present

## 2016-06-20 DIAGNOSIS — Y939 Activity, unspecified: Secondary | ICD-10-CM | POA: Insufficient documentation

## 2016-06-20 DIAGNOSIS — Y92481 Parking lot as the place of occurrence of the external cause: Secondary | ICD-10-CM | POA: Insufficient documentation

## 2016-06-20 DIAGNOSIS — Y999 Unspecified external cause status: Secondary | ICD-10-CM | POA: Diagnosis not present

## 2016-06-20 DIAGNOSIS — Z87891 Personal history of nicotine dependence: Secondary | ICD-10-CM | POA: Diagnosis not present
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
## 2016-06-20 NOTE — ED Notes (Signed)
Patient called x 2 with no answer.  Checked all waiting areas and Peds department.  Peds nurse states patient was sitting in main waiting room. Attempted to locate patient with no success, Nurse 1st Minerva Areola(Eric) notified.

## 2016-06-20 NOTE — ED Notes (Signed)
PT called from SUB waiting and front lobby . Pt did not answer.

## 2016-12-01 ENCOUNTER — Encounter (HOSPITAL_COMMUNITY): Payer: Self-pay | Admitting: Emergency Medicine

## 2016-12-01 ENCOUNTER — Emergency Department (HOSPITAL_COMMUNITY)
Admission: EM | Admit: 2016-12-01 | Discharge: 2016-12-01 | Disposition: A | Discharge status: Home / Self Care | Payer: Medicaid Other | Attending: Emergency Medicine | Admitting: Emergency Medicine

## 2016-12-01 DIAGNOSIS — R1031 Right lower quadrant pain: Secondary | ICD-10-CM

## 2016-12-01 DIAGNOSIS — Z87891 Personal history of nicotine dependence: Secondary | ICD-10-CM | POA: Insufficient documentation

## 2016-12-01 LAB — CBC
HCT: 36.5 % (ref 36.0–46.0)
HEMOGLOBIN: 11.6 g/dL — AB (ref 12.0–15.0)
MCH: 24.6 pg — ABNORMAL LOW (ref 26.0–34.0)
MCHC: 31.8 g/dL (ref 30.0–36.0)
MCV: 77.5 fL — ABNORMAL LOW (ref 78.0–100.0)
PLATELETS: 266 10*3/uL (ref 150–400)
RBC: 4.71 MIL/uL (ref 3.87–5.11)
RDW: 16.1 % — ABNORMAL HIGH (ref 11.5–15.5)
WBC: 8.3 10*3/uL (ref 4.0–10.5)

## 2016-12-01 LAB — COMPREHENSIVE METABOLIC PANEL
ALK PHOS: 45 U/L (ref 38–126)
ALT: 15 U/L (ref 14–54)
ANION GAP: 9 (ref 5–15)
AST: 21 U/L (ref 15–41)
Albumin: 4.3 g/dL (ref 3.5–5.0)
BILIRUBIN TOTAL: 0.6 mg/dL (ref 0.3–1.2)
BUN: 9 mg/dL (ref 6–20)
CALCIUM: 9.6 mg/dL (ref 8.9–10.3)
CO2: 22 mmol/L (ref 22–32)
CREATININE: 0.76 mg/dL (ref 0.44–1.00)
Chloride: 108 mmol/L (ref 101–111)
Glucose, Bld: 87 mg/dL (ref 65–99)
Potassium: 3.9 mmol/L (ref 3.5–5.1)
Sodium: 139 mmol/L (ref 135–145)
TOTAL PROTEIN: 7 g/dL (ref 6.5–8.1)

## 2016-12-01 LAB — LIPASE, BLOOD: Lipase: 21 U/L (ref 11–51)

## 2016-12-01 LAB — URINALYSIS, ROUTINE W REFLEX MICROSCOPIC
BILIRUBIN URINE: NEGATIVE
Glucose, UA: NEGATIVE mg/dL
Hgb urine dipstick: NEGATIVE
Ketones, ur: NEGATIVE mg/dL
Leukocytes, UA: NEGATIVE
NITRITE: NEGATIVE
PROTEIN: NEGATIVE mg/dL
SPECIFIC GRAVITY, URINE: 1.02 (ref 1.005–1.030)
pH: 7 (ref 5.0–8.0)

## 2016-12-01 LAB — I-STAT BETA HCG BLOOD, ED (MC, WL, AP ONLY)

## 2016-12-01 MED ORDER — IBUPROFEN 800 MG PO TABS: 800.0000 mg | ORAL_TABLET | Freq: Three times a day (TID) | ORAL | 0 refills | Status: DC

## 2016-12-01 NOTE — ED Provider Notes (Signed)
Emergency Department Provider Note   I have reviewed the triage vital signs and the nursing notes.   HISTORY  Chief Complaint Abdominal Pain   HPI Angela Henson is a 26 y.o. female with no significant PMH presents to the emergency department for evaluation of right lower quadrant abdominal pain that started this morning. She describes it as a cramping pain that is mild to moderate nonradiating. She took Midol with no relief. She is unsure of her last menstrual period has been on the Depo shot for birth control. She denies any vaginal bleeding or discharge. No nausea, vomiting, diarrhea. She denies any abdominal pain when not walking. Denies any pain with touching the area. Patient does smoke cigarettes and reports some occasional chest discomfort over the last 3 weeks that is not significantly worsening and is not the reason she presented today.    Past Medical History:  Diagnosis Date  . Headache(784.0)   . Hx of chlamydia infection   . Hx of varicella     Patient Active Problem List   Diagnosis Date Noted  . Term pregnancy 03/22/2016  . History of cesarean delivery 03/22/2016  . Postpartum care following cesarean delivery 03/22/2016  . MDD (major depressive disorder), single episode, moderate (HCC) 10/09/2014  . Alcohol use disorder, mild, abuse 10/09/2014  . Other substance dependence 10/09/2014  . Xanax use disorder, mild 10/09/2014  . Cannabis use disorder, moderate, dependence (HCC) 10/09/2014  . Substance induced mood disorder (HCC) 10/08/2014  . Polysubstance abuse 03/05/2014  . substance induced mood disorder 03/05/2014  . Depressive disorder 03/05/2014  . Preterm premature rupture of membranes (PPROM) delivered, current hospitalization 11/01/2012    Past Surgical History:  Procedure Laterality Date  . CESAREAN SECTION  11/03/2012   Procedure: CESAREAN SECTION;  Surgeon: Bing Plume, MD;  Location: [facility];  Service: Obstetrics;  Laterality: N/A;  Primary  cesarean section with delivery of baby boy at 90. Apgars 8/9.  Marland Kitchen CESAREAN SECTION N/A 03/22/2016   Procedure: REPEAT CESAREAN SECTION;  Surgeon: [name], DO;  Location: [facility];  Service: Obstetrics;  Laterality: N/A;  Heather K to RNFA- confirmed  . INCISION AND DRAINAGE ABSCESS ANAL  2013   I&D of right buttock  . WISDOM TOOTH EXTRACTION      Current Outpatient Rx  . Order #: [id number] Class: Normal  . Order #: [id number] Class: Print  . Order #: [id number] Class: Print    Allergies Patient has no known allergies.  Family History  Problem Relation Age of Onset  . Diabetes Father   . Hypotension Neg Hx   . Asthma Neg Hx   . Arthritis Neg Hx   . Alcohol abuse Neg Hx   . Birth defects Neg Hx   . Cancer Neg Hx   . COPD Neg Hx   . Depression Neg Hx   . Drug abuse Neg Hx   . Early death Neg Hx   . Hearing loss Neg Hx   . Heart disease Neg Hx   . Hyperlipidemia Neg Hx   . Hypertension Neg Hx   . Kidney disease Neg Hx   . Mental illness Neg Hx   . Learning disabilities Neg Hx   . Mental retardation Neg Hx   . Miscarriages / Stillbirths Neg Hx   . Stroke Neg Hx   . Vision loss Neg Hx   . Varicose Veins Neg Hx     Social History Social History  Substance Use Topics  . Smoking status:  Former Smoker    Packs/day: 0.50    Years: 8.00    Types: Cigarettes    Quit date: 02/09/2016  . Smokeless tobacco: Never Used  . Alcohol use No    Review of Systems  Constitutional: No fever/chills Eyes: No visual changes. ENT: No sore throat. Cardiovascular: Denies chest pain. Respiratory: Denies shortness of breath. Gastrointestinal: Positive RLQ abdominal pain with walking.  No nausea, no vomiting.  No diarrhea.  No constipation. Genitourinary: Negative for dysuria. Musculoskeletal: Negative for back pain. Skin: Negative for rash. Neurological: Negative for headaches, focal weakness or numbness.  10-point ROS otherwise  negative.  ____________________________________________   PHYSICAL EXAM:  VITAL SIGNS: ED Triage Vitals  Enc Vitals Group     BP 12/01/16 1319 114/64     Pulse Rate 12/01/16 1319 93     Resp 12/01/16 1319 16     Temp 12/01/16 1319 99.5 F (37.5 C)     Temp Source 12/01/16 1319 Oral     SpO2 12/01/16 1319 99 %     Weight 12/01/16 1541 115 lb (52.2 kg)     Height 12/01/16 1541 5' (1.524 m)     Pain Score 12/01/16 1318 5   Constitutional: Alert and oriented. Well appearing and in no acute distress. Eyes: Conjunctivae are normal.  Head: Atraumatic. Nose: No congestion/rhinnorhea. Mouth/Throat: Mucous membranes are moist.  Oropharynx non-erythematous. Neck: No stridor.  Cardiovascular: Normal rate, regular rhythm. Good peripheral circulation. Grossly normal heart sounds.   Respiratory: Normal respiratory effort.  No retractions. Lungs CTAB. Gastrointestinal: Soft and nontender. No distention. No CVA tenderness.  Musculoskeletal: No lower extremity tenderness nor edema. No gross deformities of extremities. Neurologic:  Normal speech and language. No gross focal neurologic deficits are appreciated.  Skin:  Skin is warm, dry and intact. No rash noted. Psychiatric: Mood and affect are normal. Speech and behavior are normal.  ____________________________________________   LABS (all labs ordered are listed, but only abnormal results are displayed)  Labs Reviewed  CBC - Abnormal; Notable for the following:       Result Value   Hemoglobin 11.6 (*)    MCV 77.5 (*)    MCH 24.6 (*)    RDW 16.1 (*)    All other components within normal limits  LIPASE, BLOOD  COMPREHENSIVE METABOLIC PANEL  URINALYSIS, ROUTINE W REFLEX MICROSCOPIC  I-STAT BETA HCG BLOOD, ED ([facility], [facility], [facility] ONLY)   ____________________________________________   PROCEDURES  Procedure(s) performed:   Procedures  None ____________________________________________   INITIAL IMPRESSION / ASSESSMENT AND PLAN / ED  COURSE  Pertinent labs & imaging results that were available during my care of the patient were reviewed by me and considered in my medical decision making (see chart for details).  Patient with no significant past medical history presents to the emergency department for evaluation of right lower quadrant cramping pain. Patient only has discomfort with walking. Absolutely no tenderness to deep palpation in all abdominal quadrants. No CVA tenderness. Labs are normal. Negative pregnancy test. No vaginal bleeding or discharge. Suspect musculoskeletal etiology for pain. Considered ovarian cyst but patient with no torsion-like symptoms. No clear indication at this time for further imaging. We will treat conservatively with Motrin and heat application. Provided follow-up information for establishing a primary care provider. Counseled the patient on smoking cessation. Discussed return precautions in detail.   At this time, I do not feel there is any life-threatening condition present. I have reviewed and discussed all results (EKG, imaging, lab, urine as appropriate),  exam findings with patient. I have reviewed nursing notes and appropriate previous records.  I feel the patient is safe to be discharged home without further emergent workup. Discussed usual and customary return precautions. Patient and family (if present) verbalize understanding and are comfortable with this plan.  Patient will follow-up with their primary care provider. If they do not have a primary care provider, information for follow-up has been provided to them. All questions have been answered.  ____________________________________________  FINAL CLINICAL IMPRESSION(S) / ED DIAGNOSES  Final diagnoses:  Right lower quadrant abdominal pain     MEDICATIONS GIVEN DURING THIS VISIT:  None  NEW OUTPATIENT MEDICATIONS STARTED DURING THIS VISIT:  New Prescriptions   IBUPROFEN (ADVIL,MOTRIN) 800 MG TABLET    Take 1 tablet (800 mg total)  by mouth 3 (three) times daily.      Note:  This document was prepared using Dragon voice recognition software and may include unintentional dictation errors.  Alona Bene, MD Emergency Medicine    Maia Plan, MD 12/01/16 [phone]

## 2016-12-01 NOTE — ED Notes (Signed)
Pt stable, ambulatory, states understanding of discharge instructions 

## 2016-12-01 NOTE — Discharge Instructions (Signed)

## 2016-12-01 NOTE — ED Triage Notes (Addendum)
RT LOWER ABD PAIN SINCE THIS AM , is on depo shot  For birth control, no n/v/d no hx of cysts denies vag d/c or dysuria

## 2018-02-22 ENCOUNTER — Other Ambulatory Visit: Payer: Self-pay | Admitting: Family Medicine

## 2018-02-22 ENCOUNTER — Other Ambulatory Visit (HOSPITAL_COMMUNITY)
Admission: RE | Admit: 2018-02-22 | Discharge: 2018-02-22 | Disposition: A | Discharge status: Home / Self Care | Payer: Medicaid Other | Source: Ambulatory Visit | Attending: Family Medicine | Admitting: Family Medicine

## 2018-02-22 DIAGNOSIS — Z124 Encounter for screening for malignant neoplasm of cervix: Secondary | ICD-10-CM | POA: Diagnosis present

## 2018-02-28 LAB — CYTOLOGY - PAP
Bacterial vaginitis: POSITIVE — AB
Candida vaginitis: NEGATIVE
Chlamydia: NEGATIVE
HERPES (WINDOWPATH): NEGATIVE
HPV (WINDOPATH): NOT DETECTED
Neisseria Gonorrhea: NEGATIVE
TRICH (WINDOWPATH): NEGATIVE

## 2020-04-03 ENCOUNTER — Other Ambulatory Visit: Payer: Self-pay

## 2020-04-03 ENCOUNTER — Encounter (HOSPITAL_COMMUNITY): Payer: Self-pay

## 2020-04-03 ENCOUNTER — Ambulatory Visit (HOSPITAL_COMMUNITY)
Admission: EM | Admit: 2020-04-03 | Discharge: 2020-04-03 | Disposition: A | Discharge status: Home / Self Care | Payer: Medicaid Other | Attending: Physician Assistant | Admitting: Physician Assistant

## 2020-04-03 DIAGNOSIS — N3000 Acute cystitis without hematuria: Secondary | ICD-10-CM | POA: Diagnosis not present

## 2020-04-03 DIAGNOSIS — R42 Dizziness and giddiness: Secondary | ICD-10-CM | POA: Insufficient documentation

## 2020-04-03 DIAGNOSIS — Z3202 Encounter for pregnancy test, result negative: Secondary | ICD-10-CM | POA: Diagnosis not present

## 2020-04-03 LAB — POCT URINALYSIS DIP (DEVICE)
Bilirubin Urine: NEGATIVE
Glucose, UA: NEGATIVE mg/dL
Ketones, ur: NEGATIVE mg/dL
Nitrite: POSITIVE — AB
Protein, ur: NEGATIVE mg/dL
Specific Gravity, Urine: >=1.03 (ref 1.005–1.030)
Urobilinogen, UA: 0.2 mg/dL (ref 0.0–1.0)
pH: 5.5 (ref 5.0–8.0)

## 2020-04-03 LAB — POC URINE PREG, ED: Preg Test, Ur: NEGATIVE

## 2020-04-03 MED ORDER — MECLIZINE HCL 25 MG PO TABS: 25.0000 mg | ORAL_TABLET | Freq: Three times a day (TID) | ORAL | 0 refills | Status: DC | PRN

## 2020-04-03 MED ORDER — SULFAMETHOXAZOLE-TRIMETHOPRIM 800-160 MG PO TABS: 1.0000 | ORAL_TABLET | Freq: Two times a day (BID) | ORAL | 0 refills | Status: AC

## 2020-04-03 NOTE — Discharge Instructions (Signed)
Take medication as prescribed.

## 2020-04-03 NOTE — ED Triage Notes (Signed)
Pt presents with epigastric abdominal pain, dizziness, fatigue, and nausea X 4 days.

## 2020-04-03 NOTE — ED Provider Notes (Signed)
[facility]    CSN: [id number] Arrival date & time: 04/03/20  1143      History   Chief Complaint Chief Complaint  Patient presents with  . Abdominal Pain  . Dizziness  . Fatigue  . Nausea    HPI Angela Henson is a 29 y.o. female.   Patient here c/w dizziness and epigastric pain x 3 - 4 days.  Admits fatigue, dizziness (lightheaded), nausea, epigastric pain.  Denies f/c, URI sx, cough, wheezing, vomiting, diarrhea, GU sx.  No sick contacts, she has not taken any medications for this.     Past Medical History:  Diagnosis Date  . Headache(784.0)   . Hx of chlamydia infection   . Hx of varicella     Patient Active Problem List   Diagnosis Date Noted  . Term pregnancy 03/22/2016  . History of cesarean delivery 03/22/2016  . Postpartum care following cesarean delivery 03/22/2016  . MDD (major depressive disorder), single episode, moderate (HCC) 10/09/2014  . Alcohol use disorder, mild, abuse 10/09/2014  . Other substance dependence 10/09/2014  . Xanax use disorder, 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/2014  . Cannabis use disorder, moderate, dependence (HCC) 10/09/2014  . Substance induced mood disorder (HCC) 10/08/2014  . Polysubstance abuse (HCC) 03/05/2014  . substance induced mood disorder 03/05/2014  . Depressive disorder 03/05/2014  . Preterm premature rupture of membranes (PPROM) delivered, current hospitalization 11/01/2012    Past Surgical History:  Procedure Laterality Date  . CESAREAN SECTION  11/03/2012   Procedure: CESAREAN SECTION;  Surgeon: Bing Plume, MD;  Location: [facility];  Service: Obstetrics;  Laterality: N/A;  Primary cesarean section with delivery of baby boy at 17. Apgars 8/9.  Marland Kitchen CESAREAN SECTION N/A 03/22/2016   Procedure: REPEAT CESAREAN SECTION;  Surgeon: [name], DO;  Location: [facility];  Service: Obstetrics;  Laterality: N/A;  Heather K to RNFA- confirmed  . INCISION AND DRAINAGE ABSCESS ANAL  2013   I&D of right  buttock  . WISDOM TOOTH EXTRACTION      OB History    Gravida  2   Para  2   Term  1   Preterm  1   AB  0   Living  2     SAB  0   TAB  0   Ectopic  0   Multiple  0   Live Births  2            Home Medications    Prior to Admission medications   Medication Sig Start Date End Date Taking? Authorizing Provider  ferrous sulfate 325 (65 FE) MG tablet Take 1 tablet (325 mg total) by mouth 2 (two) times daily with a meal. 03/24/16   Banga, Cecilia Worema, DO  ibuprofen (ADVIL,MOTRIN) 800 MG tablet Take 1 tablet (800 mg total) by mouth 3 (three) times daily. 12/01/16   Long, Arlyss Repress, MD  meclizine (ANTIVERT) 25 MG tablet Take 1 tablet (25 mg total) by mouth 3 (three) times daily as needed for dizziness. 04/03/20   Evern Core, PA-C  oxyCODONE-acetaminophen (ROXICET) 5-325 MG tablet Take 1 tablet by mouth every 4 (four) hours as needed for severe pain. 03/24/16   Banga, Sharol Given, DO  sulfamethoxazole-trimethoprim (BACTRIM DS) 800-160 MG tablet Take 1 tablet by mouth 2 (two) times daily for 5 days. 04/03/20 04/08/20  Evern Core, PA-C    Family History Family History  Problem Relation Age of Onset  . Diabetes Father   . Hypotension Neg  Hx   . Asthma Neg Hx   . Arthritis Neg Hx   . Alcohol abuse Neg Hx   . Birth defects Neg Hx   . Cancer Neg Hx   . COPD Neg Hx   . Depression Neg Hx   . Drug abuse Neg Hx   . Early death Neg Hx   . Hearing loss Neg Hx   . Heart disease Neg Hx   . Hyperlipidemia Neg Hx   . Hypertension Neg Hx   . Kidney disease Neg Hx   . Mental illness Neg Hx   . Learning disabilities Neg Hx   . Mental retardation Neg Hx   . Miscarriages / Stillbirths Neg Hx   . Stroke Neg Hx   . Vision loss Neg Hx   . Varicose Veins Neg Hx     Social History Social History   Tobacco Use  . Smoking status: Former Smoker    Packs/day: 0.50    Years: 8.00    Pack years: 4.00    Types: Cigarettes    Quit date: 02/09/2016    Years since  quitting: 4.1  . Smokeless tobacco: Never Used  Substance Use Topics  . Alcohol use: No  . Drug use: No    Comment: x pills and molly 2013     Allergies   Patient has no known allergies.   Review of Systems Review of Systems  Constitutional: Negative for chills and fever.  HENT: Negative for ear pain and sore throat.   Eyes: Negative for pain and visual disturbance.  Respiratory: Negative for cough and shortness of breath.   Cardiovascular: Negative for chest pain and palpitations.  Gastrointestinal: Positive for nausea. Negative for abdominal pain, diarrhea and vomiting.  Genitourinary: Negative for decreased urine volume, difficulty urinating, dysuria, flank pain, frequency, hematuria, urgency, vaginal bleeding and vaginal discharge.  Musculoskeletal: Negative for arthralgias, back pain and gait problem.  Skin: Negative for color change and rash.  Neurological: Positive for light-headedness. Negative for seizures, syncope, weakness, numbness and headaches.  Psychiatric/Behavioral: Negative for confusion and sleep disturbance.  All other systems reviewed and are negative.    Physical Exam Triage Vital Signs ED Triage Vitals  Enc Vitals Group     BP 04/03/20 1321 107/73     Pulse Rate 04/03/20 1321 77     Resp 04/03/20 1321 17     Temp 04/03/20 1321 98.6 F (37 C)     Temp Source 04/03/20 1321 Oral     SpO2 04/03/20 1321 100 %     Weight --      Height --      Head Circumference --      Peak Flow --      Pain Score 04/03/20 1322 5     Pain Loc --      Pain Edu? --      Excl. in GC? --    No data found.  Updated Vital Signs BP 107/73 (BP Location: Right Arm)   Pulse 77   Temp 98.6 F (37 C) (Oral)   Resp 17   LMP 02/26/2020   SpO2 100%   Visual Acuity Right Eye Distance:   Left Eye Distance:   Bilateral Distance:    Right Eye Near:   Left Eye Near:    Bilateral Near:     Physical Exam Vitals and nursing note reviewed.  Constitutional:       General: She is not in acute distress.    Appearance: She  is well-developed.  HENT:     Head: Normocephalic and atraumatic.     Right Ear: Tympanic membrane and ear canal normal.     Left Ear: Tympanic membrane and ear canal normal.     Nose: Nose normal. No congestion or rhinorrhea.     Mouth/Throat:     Pharynx: No oropharyngeal exudate or posterior oropharyngeal erythema.  Eyes:     General: No scleral icterus.    Extraocular Movements: Extraocular movements intact.     Conjunctiva/sclera: Conjunctivae normal.  Cardiovascular:     Rate and Rhythm: Normal rate and regular rhythm.     Heart sounds: No murmur heard.   Pulmonary:     Effort: Pulmonary effort is normal. No respiratory distress.     Breath sounds: Normal breath sounds.  Abdominal:     Palpations: Abdomen is soft.     Tenderness: There is no abdominal tenderness. There is no right CVA tenderness, left CVA tenderness or rebound.  Musculoskeletal:        General: Normal range of motion.     Cervical back: Normal range of motion and neck supple.  Lymphadenopathy:     Cervical: No cervical adenopathy.  Skin:    General: Skin is warm and dry.     Capillary Refill: Capillary refill takes less than 2 seconds.  Neurological:     General: No focal deficit present.     Mental Status: She is alert and oriented to person, place, and time.     Cranial Nerves: No cranial nerve deficit.     Motor: No weakness.     Gait: Gait normal.  Psychiatric:        Mood and Affect: Mood normal.        Behavior: Behavior normal.      UC Treatments / Results  Labs (all labs ordered are listed, but only abnormal results are displayed) Labs Reviewed  POCT URINALYSIS DIP (DEVICE) - Abnormal; Notable for the following components:      Result Value   Hgb urine dipstick TRACE (*)    Nitrite POSITIVE (*)    Leukocytes,Ua SMALL (*)    All other components within normal limits  URINE CULTURE  POC URINE PREG, ED     EKG   Radiology No results found.  Procedures Procedures (including critical care time)  Medications Ordered in UC Medications - No data to display  Initial Impression / Assessment and Plan / UC Course  I have reviewed the triage vital signs and the nursing notes.  Pertinent labs & imaging results that were available during my care of the patient were reviewed by me and considered in my medical decision making (see chart for details).     Take mediation as prescribed. Follow up with PCP if no improvement in sx, sx likely due to UTI. Final Clinical Impressions(s) / UC Diagnoses   Final diagnoses:  Acute cystitis without hematuria  Dizziness and giddiness     Discharge Instructions     Take medication as prescribed.    ED Prescriptions    Medication Sig Dispense Auth. Provider   sulfamethoxazole-trimethoprim (BACTRIM DS) 800-160 MG tablet Take 1 tablet by mouth 2 (two) times daily for 5 days. 10 tablet Peri Jefferson, PA-C   meclizine (ANTIVERT) 25 MG tablet Take 1 tablet (25 mg total) by mouth 3 (three) times daily as needed for dizziness. 30 tablet Peri Jefferson, PA-C     PDMP not reviewed this encounter.   Peri Jefferson, PA-C 04/03/20 1401

## 2020-04-05 LAB — URINE CULTURE: Culture: >=100000 — AB

## 2020-04-10 ENCOUNTER — Inpatient Hospital Stay (HOSPITAL_COMMUNITY)
Admission: AD | Admit: 2020-04-10 | Discharge: 2020-04-11 | Disposition: A | Discharge status: Home / Self Care | Payer: Medicaid Other | Attending: Obstetrics and Gynecology | Admitting: Obstetrics and Gynecology

## 2020-04-10 ENCOUNTER — Inpatient Hospital Stay (HOSPITAL_COMMUNITY): Payer: Medicaid Other

## 2020-04-10 ENCOUNTER — Encounter (HOSPITAL_COMMUNITY): Payer: Self-pay | Admitting: Obstetrics and Gynecology

## 2020-04-10 ENCOUNTER — Other Ambulatory Visit: Payer: Self-pay

## 2020-04-10 DIAGNOSIS — O26891 Other specified pregnancy related conditions, first trimester: Secondary | ICD-10-CM | POA: Insufficient documentation

## 2020-04-10 DIAGNOSIS — Z3A01 Less than 8 weeks gestation of pregnancy: Secondary | ICD-10-CM | POA: Insufficient documentation

## 2020-04-10 DIAGNOSIS — O26899 Other specified pregnancy related conditions, unspecified trimester: Secondary | ICD-10-CM

## 2020-04-10 DIAGNOSIS — Z87891 Personal history of nicotine dependence: Secondary | ICD-10-CM | POA: Diagnosis not present

## 2020-04-10 DIAGNOSIS — O3680X0 Pregnancy with inconclusive fetal viability, not applicable or unspecified: Secondary | ICD-10-CM | POA: Diagnosis not present

## 2020-04-10 DIAGNOSIS — Z8619 Personal history of other infectious and parasitic diseases: Secondary | ICD-10-CM | POA: Diagnosis not present

## 2020-04-10 DIAGNOSIS — O34219 Maternal care for unspecified type scar from previous cesarean delivery: Secondary | ICD-10-CM | POA: Diagnosis not present

## 2020-04-10 DIAGNOSIS — R1011 Right upper quadrant pain: Secondary | ICD-10-CM | POA: Insufficient documentation

## 2020-04-10 DIAGNOSIS — O2341 Unspecified infection of urinary tract in pregnancy, first trimester: Secondary | ICD-10-CM

## 2020-04-10 DIAGNOSIS — R109 Unspecified abdominal pain: Secondary | ICD-10-CM | POA: Diagnosis not present

## 2020-04-10 DIAGNOSIS — Z833 Family history of diabetes mellitus: Secondary | ICD-10-CM | POA: Diagnosis not present

## 2020-04-10 DIAGNOSIS — O2340 Unspecified infection of urinary tract in pregnancy, unspecified trimester: Secondary | ICD-10-CM | POA: Diagnosis not present

## 2020-04-10 LAB — URINALYSIS, ROUTINE W REFLEX MICROSCOPIC
Glucose, UA: NEGATIVE mg/dL
Hgb urine dipstick: NEGATIVE
Ketones, ur: NEGATIVE mg/dL
Nitrite: NEGATIVE
Protein, ur: 30 mg/dL — AB
Specific Gravity, Urine: >1.03 — ABNORMAL HIGH (ref 1.005–1.030)
pH: 5.5 (ref 5.0–8.0)

## 2020-04-10 LAB — WET PREP, GENITAL
Sperm: NONE SEEN
Trich, Wet Prep: NONE SEEN

## 2020-04-10 LAB — POCT PREGNANCY, URINE: Preg Test, Ur: POSITIVE — AB

## 2020-04-10 LAB — URINALYSIS, MICROSCOPIC (REFLEX): RBC / HPF: NONE SEEN RBC/hpf (ref 0–5)

## 2020-04-10 LAB — CBC
HCT: 40.5 % (ref 36.0–46.0)
Hemoglobin: 13.4 g/dL (ref 12.0–15.0)
MCH: 29 pg (ref 26.0–34.0)
MCHC: 33.1 g/dL (ref 30.0–36.0)
MCV: 87.7 fL (ref 80.0–100.0)
Platelets: 231 10*3/uL (ref 150–400)
RBC: 4.62 MIL/uL (ref 3.87–5.11)
RDW: 14.1 % (ref 11.5–15.5)
WBC: 10 10*3/uL (ref 4.0–10.5)
nRBC: 0 % (ref 0.0–0.2)

## 2020-04-10 LAB — HCG, QUANTITATIVE, PREGNANCY: hCG, Beta Chain, Quant, S: 65 m[IU]/mL — ABNORMAL HIGH (ref <5)

## 2020-04-10 NOTE — MAU Provider Note (Addendum)
History     CSN: [id number]  Arrival date and time: 04/10/20 2212   First Provider Initiated Contact with Patient 04/10/20 2257      Chief Complaint  Patient presents with  . Abdominal Pain  . Possible Pregnancy   29 y.o. [id number] @[redacted]w[redacted]d  by LMP presenting with abdominal pain. Pain in RUQ, dull and achy, rates 3/10. HAs not taken anything for it. Was treated for UTI last week but stopped medicine (Bactrim) when she thought she may be pregnant. Denies current UTI sx. Denies VB or vaginal discharge.   OB History    Gravida  3   Para  2   Term  1   Preterm  1   AB  0   Living  2     SAB  0   TAB  0   Ectopic  0   Multiple  0   Live Births  2           Past Medical History:  Diagnosis Date  . Headache(784.0)   . Hx of chlamydia infection   . Hx of varicella     Past Surgical History:  Procedure Laterality Date  . CESAREAN SECTION  11/03/2012   Procedure: CESAREAN SECTION;  Surgeon: 01/01/2013, MD;  Location: [facility];  Service: Obstetrics;  Laterality: N/A;  Primary cesarean section with delivery of baby boy at 5. Apgars 8/9.  10/9 CESAREAN SECTION N/A 03/22/2016   Procedure: REPEAT CESAREAN SECTION;  Surgeon: 03/24/2016, DO;  Location: [facility];  Service: Obstetrics;  Laterality: N/A;  Heather K to RNFA- confirmed  . INCISION AND DRAINAGE ABSCESS ANAL  2013   I&D of right buttock  . WISDOM TOOTH EXTRACTION      Family History  Problem Relation Age of Onset  . Diabetes Father   . Hypotension Neg Hx   . Asthma Neg Hx   . Arthritis Neg Hx   . Alcohol abuse Neg Hx   . Birth defects Neg Hx   . Cancer Neg Hx   . COPD Neg Hx   . Depression Neg Hx   . Drug abuse Neg Hx   . Early death Neg Hx   . Hearing loss Neg Hx   . Heart disease Neg Hx   . Hyperlipidemia Neg Hx   . Hypertension Neg Hx   . Kidney disease Neg Hx   . Mental illness Neg Hx   . Learning disabilities Neg Hx   . Mental retardation Neg Hx   . Miscarriages /  Stillbirths Neg Hx   . Stroke Neg Hx   . Vision loss Neg Hx   . Varicose Veins Neg Hx     Social History   Tobacco Use  . Smoking status: Former Smoker    Packs/day: 0.50    Years: 8.00    Pack years: 4.00    Types: Cigarettes    Quit date: 02/09/2016    Years since quitting: 4.1  . Smokeless tobacco: Never Used  Substance Use Topics  . Alcohol use: No  . Drug use: No    Comment: x pills and molly 2013    Allergies: No Known Allergies  Medications Prior to Admission  Medication Sig Dispense Refill Last Dose  . ferrous sulfate 325 (65 FE) MG tablet Take 1 tablet (325 mg total) by mouth 2 (two) times daily with a meal. 60 tablet 3   . ibuprofen (ADVIL,MOTRIN) 800 MG tablet Take 1 tablet (800 mg total) by  mouth 3 (three) times daily. 21 tablet 0   . meclizine (ANTIVERT) 25 MG tablet Take 1 tablet (25 mg total) by mouth 3 (three) times daily as needed for dizziness. 30 tablet 0   . oxyCODONE-acetaminophen (ROXICET) 5-325 MG tablet Take 1 tablet by mouth every 4 (four) hours as needed for severe pain. 30 tablet 0     Review of Systems  Constitutional: Negative for chills and fever.  Gastrointestinal: Positive for abdominal pain and nausea. Negative for constipation, diarrhea and vomiting.  Genitourinary: Negative for dysuria, frequency, urgency, vaginal bleeding and vaginal discharge.   Physical Exam   Blood pressure (!) 116/56, pulse 96, temperature 98.7 F (37.1 C), temperature source Oral, resp. rate 16, last menstrual period 03/07/2020, unknown if currently breastfeeding.  Physical Exam  Nursing note and vitals reviewed. Constitutional: She is oriented to person, place, and time. No distress.  HENT:  Head: Normocephalic and atraumatic.  Respiratory: Effort normal. No respiratory distress.  GI: Soft. She exhibits no distension. There is no abdominal tenderness. There is no rebound and no guarding.  Genitourinary:    Genitourinary Comments: Deferred (pt request)    Neurological: She is alert and oriented to person, place, and time.  Skin: Skin is warm and dry.  Psychiatric: Mood normal.   Results for orders placed or performed during the hospital encounter of 04/10/20 (from the past 24 hour(s))  Pregnancy, urine POC     Status: Abnormal   Collection Time: 04/10/20 10:36 PM  Result Value Ref Range   Preg Test, Ur POSITIVE (A) NEGATIVE  Urinalysis, Routine w reflex microscopic     Status: Abnormal   Collection Time: 04/10/20 10:56 PM  Result Value Ref Range   Color, Urine YELLOW YELLOW   APPearance CLOUDY (A) CLEAR   Specific Gravity, Urine >1.030 (H) 1.005 - 1.030   pH 5.5 5.0 - 8.0   Glucose, UA NEGATIVE NEGATIVE mg/dL   Hgb urine dipstick NEGATIVE NEGATIVE   Bilirubin Urine SMALL (A) NEGATIVE   Ketones, ur NEGATIVE NEGATIVE mg/dL   Protein, ur 30 (A) NEGATIVE mg/dL   Nitrite NEGATIVE NEGATIVE   Leukocytes,Ua MODERATE (A) NEGATIVE  Urinalysis, Microscopic (reflex)     Status: Abnormal   Collection Time: 04/10/20 10:56 PM  Result Value Ref Range   RBC / HPF NONE SEEN 0 - 5 RBC/hpf   WBC, UA 11-20 0 - 5 WBC/hpf   Bacteria, UA RARE (A) NONE SEEN   Squamous Epithelial / LPF 11-20 0 - 5   Mucus PRESENT    Budding Yeast PRESENT   CBC     Status: None   Collection Time: 04/10/20 11:00 PM  Result Value Ref Range   WBC 10.0 4.0 - 10.5 K/uL   RBC 4.62 3.87 - 5.11 MIL/uL   Hemoglobin 13.4 12.0 - 15.0 g/dL   HCT 84.1 36 - 46 %   MCV 87.7 80.0 - 100.0 fL   MCH 29.0 26.0 - 34.0 pg   MCHC 33.1 30.0 - 36.0 g/dL   RDW 32.4 40.1 - 02.7 %   Platelets 231 150 - 400 K/uL   nRBC 0.0 0.0 - 0.2 %  hCG, quantitative, pregnancy     Status: Abnormal   Collection Time: 04/10/20 11:00 PM  Result Value Ref Range   hCG, Beta Chain, Quant, S 65 (H) <5 mIU/mL  Wet prep, genital     Status: Abnormal   Collection Time: 04/10/20 11:10 PM  Result Value Ref Range   Yeast Wet Prep HPF  POC PRESENT (A) NONE SEEN   Trich, Wet Prep NONE SEEN NONE SEEN   Clue  Cells Wet Prep HPF POC PRESENT (A) NONE SEEN   WBC, Wet Prep HPF POC MODERATE (A) NONE SEEN   Sperm NONE SEEN    US OB LESS THAN 14 WEEKS WITH OB TRANSVAGINAL  Result Date: 04/11/2020 CLINICAL DATA:  Initial evaluation for acute right upper quadrant pain, positive beta HCG. EXAM: OBSTETRIC <14 WK [place] AND TRANSVAGINAL OB US TECHNIQUE: Both transabdominal and transvaginal ultrasound examinations were performed for complete evaluation of the gestation as well as the maternal uterus, adnexal regions, and pelvic cul-de-sac. Transvaginal technique was performed to assess early pregnancy. COMPARISON:  None available. FINDINGS: Intrauterine gestational sac: No discrete intrauterine gestational sac. Small amount of free fluid noted within the endometrial cavity. Yolk sac:  Negative. Embryo:  Negative. Cardiac Activity: Negative. Subchorionic hemorrhage:  None visualized. Maternal uterus/adnexae: Ovaries are normal in appearance bilaterally. Degenerating corpus luteal cyst noted on the right. No free fluid within the pelvis. IMPRESSION: 1. Early pregnancy with no discrete IUP or adnexal mass identified. Small volume free fluid within the endometrial cavity. Findings are consistent with a pregnancy of unknown anatomic location at this time. Differential considerations include IUP to early to visualize, recent SAB, or possibly occult ectopic pregnancy. Close clinical monitoring with serial beta HCGs and close interval follow-up ultrasound recommended as clinically warranted. 2. Degenerating right ovarian corpus luteal cyst. 3. No other acute maternal uterine or adnexal abnormality identified. Electronically Signed   By: Jeannine Boga M.D.   On: 04/11/2020 00:27   MAU Course  Procedures  MDM Labs and [place] ordered and reviewed. No IUP or adnexal mass seen on [place], suspect early pregnancy, but cannot r/o ectopic pregnancy or failed pregnancy-discussed with pt. Will follow quant in 48 hrs. Will treat UTI with Duricef.  Stable for discharge home.  Assessment and Plan   1. Pregnancy, location unknown   2. Abdominal pain affecting pregnancy   3. Urinary tract infection in mother during first trimester of pregnancy    Discharge home Follow up at [facility] on 04/13/20 for qhcg- left message at office Ectopic/SAB precautions Rx Duricef  Allergies as of 04/11/2020   No Known Allergies     Medication List    STOP taking these medications   ferrous sulfate 325 (65 FE) MG tablet   ibuprofen 800 MG tablet Commonly known as: ADVIL   meclizine 25 MG tablet Commonly known as: ANTIVERT   oxyCODONE-acetaminophen 5-325 MG tablet Commonly known as: Roxicet     TAKE these medications   cefadroxil 500 MG capsule Commonly known as: DURICEF Take 1 capsule (500 mg total) by mouth 2 (two) times daily.      Julianne Handler, CNM 04/11/2020, 1:08 AM

## 2020-04-10 NOTE — MAU Note (Signed)
Pt reports to MAU c/o RUQ pain that is dull that is a 3/10. No bleeding or LOF.  LMP May 15th 2021. Pt reports 1+ UPT at home. Pt states she was seen at the hospital for a UTI last week and she was taking the medication but it said not to take if she was pregnant so the patient stopped. Pt states this is the same pain as last time and she thinks she needs new meds.

## 2020-04-11 DIAGNOSIS — R109 Unspecified abdominal pain: Secondary | ICD-10-CM

## 2020-04-11 DIAGNOSIS — O2340 Unspecified infection of urinary tract in pregnancy, unspecified trimester: Secondary | ICD-10-CM

## 2020-04-11 DIAGNOSIS — O26899 Other specified pregnancy related conditions, unspecified trimester: Secondary | ICD-10-CM

## 2020-04-11 DIAGNOSIS — O3680X0 Pregnancy with inconclusive fetal viability, not applicable or unspecified: Secondary | ICD-10-CM

## 2020-04-11 MED ORDER — CEFADROXIL 500 MG PO CAPS
500.0000 mg | ORAL_CAPSULE | Freq: Two times a day (BID) | ORAL | 0 refills | Status: DC
Start: 2020-04-11 — End: 2020-09-28

## 2020-04-11 NOTE — Discharge Instructions (Signed)
Abdominal Pain During Pregnancy  Belly (abdominal) pain is common during pregnancy. There are many possible causes. Most of the time, it is not a serious problem. Other times, it can be a sign that something is wrong with the pregnancy. Always tell your doctor if you have belly pain. Follow these instructions at home:  Do not have sex or put anything in your vagina until your pain goes away completely.  Get plenty of rest until your pain gets better.  Drink enough fluid to keep your pee (urine) pale yellow.  Take over-the-counter and prescription medicines only as told by your doctor.  Keep all follow-up visits as told by your doctor. This is important. Contact a doctor if:  Your pain continues or gets worse after resting.  You have lower belly pain that: ? Comes and goes at regular times. ? Spreads to your back. ? Feels like menstrual cramps.  You have pain or burning when you pee (urinate). Get help right away if:  You have a fever or chills.  You have vaginal bleeding.  You are leaking fluid from your vagina.  You are passing tissue from your vagina.  You throw up (vomit) for more than 24 hours.  You have watery poop (diarrhea) for more than 24 hours.  Your baby is moving less than usual.  You feel very weak or faint.  You have shortness of breath.  You have very bad pain in your upper belly. Summary  Belly (abdominal) pain is common during pregnancy. There are many possible causes.  If you have belly pain during pregnancy, tell your doctor right away.  Keep all follow-up visits as told by your doctor. This is important. This information is not intended to replace advice given to you by your health care provider. Make sure you discuss any questions you have with your health care provider. Document Revised: 01/28/2019 Document Reviewed: 01/12/2017 Elsevier Patient Education  2020 [employer]   Pregnancy and Urinary Tract Infection  A urinary tract  infection (UTI) is an infection of any part of the urinary tract. This includes the kidneys, the tubes that connect your kidneys to your bladder (ureters), the bladder, and the tube that carries urine out of your body (urethra). These organs make, store, and get rid of urine in the body. Your health care provider may use other names to describe the infection. An upper UTI affects the ureters and kidneys (pyelonephritis). A lower UTI affects the bladder (cystitis) and urethra (urethritis). Most urinary tract infections are caused by bacteria in your genital area, around the entrance to your urinary tract (urethra). These bacteria grow and cause irritation and inflammation of your urinary tract. You are more likely to develop a UTI during pregnancy because the physical and hormonal changes your body goes through can make it easier for bacteria to get into your urinary tract. Your growing baby also puts pressure on your bladder and can affect urine flow. It is important to recognize and treat UTIs in pregnancy because of the risk of serious complications for both you and your baby. How does this affect me? Symptoms of a UTI include:  Needing to urinate right away (urgently).  Frequent urination or passing small amounts of urine frequently.  Pain or burning with urination.  Blood in the urine.  Urine that smells bad or unusual.  Trouble urinating.  Cloudy urine.  Pain in the abdomen or lower back.  Vaginal discharge. You may also have:  Vomiting or a decreased appetite.  Confusion.  Irritability or tiredness.  A fever.  Diarrhea. How does this affect my baby? An untreated UTI during pregnancy could lead to a kidney infection or a systemic infection, which can cause health problems that could affect your baby. Possible complications of an untreated UTI include:  Giving birth to your baby before 37 weeks of pregnancy (premature).  Having a baby with a low birth weight.  Developing  high blood pressure during pregnancy (preeclampsia).  Having a low hemoglobin level (anemia). What can I do to lower my risk? To prevent a UTI:  Go to the bathroom as soon as you feel the need. Do not hold urine for long periods of time.  Always wipe from front to back, especially after a bowel movement. Use each tissue one time when you wipe.  Empty your bladder after sex.  Keep your genital area dry.  Drink 6-10 glasses of water each day.  Do not douche or use deodorant sprays. How is this treated? Treatment for this condition may include:  Antibiotic medicines that are safe to take during pregnancy.  Other medicines to treat less common causes of UTI. Follow these instructions at home:  If you were prescribed an antibiotic medicine, take it as told by your health care provider. Do not stop using the antibiotic even if you start to feel better.  Keep all follow-up visits as told by your health care provider. This is important. Contact a health care provider if:  Your symptoms do not improve or they get worse.  You have abnormal vaginal discharge. Get help right away if you:  Have a fever.  Have nausea and vomiting.  Have back or side pain.  Feel contractions in your uterus.  Have lower belly pain.  Have a gush of fluid from your vagina.  Have blood in your urine. Summary  A urinary tract infection (UTI) is an infection of any part of the urinary tract, which includes the kidneys, ureters, bladder, and urethra.  Most urinary tract infections are caused by bacteria in your genital area, around the entrance to your urinary tract (urethra).  You are more likely to develop a UTI during pregnancy.  If you were prescribed an antibiotic medicine, take it as told by your health care provider. Do not stop using the antibiotic even if you start to feel better. This information is not intended to replace advice given to you by your health care provider. Make sure you  discuss any questions you have with your health care provider. Document Revised: 02/01/2019 Document Reviewed: 09/13/2018 Elsevier Patient Education  [place].

## 2020-04-13 LAB — GC/CHLAMYDIA PROBE AMP (~~LOC~~) NOT AT ARMC
Chlamydia: NEGATIVE
Comment: NEGATIVE
Comment: NORMAL
Neisseria Gonorrhea: NEGATIVE

## 2020-09-28 ENCOUNTER — Inpatient Hospital Stay (HOSPITAL_COMMUNITY)
Admission: AD | Admit: 2020-09-28 | Discharge: 2020-09-28 | Disposition: A | Discharge status: Home / Self Care | Payer: Medicaid Other | Attending: Obstetrics and Gynecology | Admitting: Obstetrics and Gynecology

## 2020-09-28 ENCOUNTER — Encounter (HOSPITAL_COMMUNITY): Payer: Self-pay | Admitting: Obstetrics and Gynecology

## 2020-09-28 ENCOUNTER — Other Ambulatory Visit: Payer: Self-pay

## 2020-09-28 DIAGNOSIS — N912 Amenorrhea, unspecified: Secondary | ICD-10-CM | POA: Insufficient documentation

## 2020-09-28 DIAGNOSIS — Z3202 Encounter for pregnancy test, result negative: Secondary | ICD-10-CM | POA: Diagnosis not present

## 2020-09-28 NOTE — MAU Provider Note (Signed)
Angela Henson is a 29 y.o. [id number] at [redacted]w[redacted]d who presents to MAU today for pregnancy verification. The patient denies abdominal pain or vaginal bleeding today. States she was told by her OBGYN to come here for pregnancy confirmation and letter.  BP (!) 109/57 (BP Location: Right Arm)   Pulse 86   Temp 99.1 F (37.3 C) (Oral)   Resp 20   Ht 5' (1.524 m)   Wt 54.3 kg   LMP 08/28/2020   Breastfeeding Unknown   BMI 23.36 kg/m   CONSTITUTIONAL: Well-developed, well-nourished female in no acute distress.  MUSCULOSKELETAL: Normal range of motion.  CARDIOVASCULAR: Regular heart rate RESPIRATORY: Normal effort NEUROLOGICAL: Alert and oriented to person, place, and time.  SKIN: No pallor. PSYCH: Normal mood and affect. Normal behavior. Normal judgment and thought content.  MDM Patient advised that without concerning symptoms today UPT will not be performed in MAU at this time  A: Amenorrhea  P: Discharge home Pt advised that routine pregnancy tests are offered in all [facility] offices Monday- Friday  Patient may return to MAU as needed or if her condition were to change or worsen   Donette Larry, [place]  09/28/2020 8:24 PM

## 2020-09-28 NOTE — Discharge Instructions (Signed)
[facility] Providers    [facility]       Phone: [phone]  [facility]   Phone: [phone]  [facility] [place]  Phone: [phone]  [facility] [place]  Phone: [phone]  [facility] [place]  Phone: [phone]  [facility]   Phone: [phone]  [facility]       Phone: [phone]  [facility]    Phone: [phone]   [facility]    Phone: [phone]  [facility]    Phone: [phone]  [facility]    Phone: [phone]  [facility]       Phone: [phone]   [facility]  Phone: [phone]  [facility]    Phone: [phone]  [facility]   Phone: [phone]  [facility]      Phone: [phone]  [facility]    Phone: [phone]   

## 2020-09-28 NOTE — MAU Note (Signed)
PT SAYS SHE SAB ON 04-29-20- SO NOT 29.2 WEEKS PREG.     DID HPT YESTERDAY - POSITIVE  X2 . [facility] SHE CALLED OFFICE - TOLD SHE NEEDED A CONFIRMATION LETTER.  NO CRAMPING / NO BLEEDING.

## 2020-09-29 ENCOUNTER — Encounter (HOSPITAL_COMMUNITY): Payer: Self-pay | Admitting: Obstetrics and Gynecology

## 2020-09-29 ENCOUNTER — Other Ambulatory Visit: Payer: Self-pay

## 2020-09-29 ENCOUNTER — Inpatient Hospital Stay (HOSPITAL_COMMUNITY)
Admission: AD | Admit: 2020-09-29 | Discharge: 2020-09-29 | Disposition: A | Discharge status: Home / Self Care | Payer: Medicaid Other | Attending: Obstetrics and Gynecology | Admitting: Obstetrics and Gynecology

## 2020-09-29 DIAGNOSIS — Z789 Other specified health status: Secondary | ICD-10-CM

## 2020-09-29 DIAGNOSIS — Z3202 Encounter for pregnancy test, result negative: Secondary | ICD-10-CM | POA: Insufficient documentation

## 2020-09-29 LAB — URINALYSIS, ROUTINE W REFLEX MICROSCOPIC
Bacteria, UA: NONE SEEN
Bilirubin Urine: NEGATIVE
Glucose, UA: NEGATIVE mg/dL
Ketones, ur: NEGATIVE mg/dL
Leukocytes,Ua: NEGATIVE
Nitrite: NEGATIVE
Protein, ur: 100 mg/dL — AB
RBC / HPF: >50 RBC/hpf — ABNORMAL HIGH (ref 0–5)
Specific Gravity, Urine: 1.031 — ABNORMAL HIGH (ref 1.005–1.030)
pH: 5 (ref 5.0–8.0)

## 2020-09-29 LAB — CBC
HCT: 42.4 % (ref 36.0–46.0)
Hemoglobin: 13.4 g/dL (ref 12.0–15.0)
MCH: 28.7 pg (ref 26.0–34.0)
MCHC: 31.6 g/dL (ref 30.0–36.0)
MCV: 90.8 fL (ref 80.0–100.0)
Platelets: 288 10*3/uL (ref 150–400)
RBC: 4.67 MIL/uL (ref 3.87–5.11)
RDW: 14 % (ref 11.5–15.5)
WBC: 10.6 10*3/uL — ABNORMAL HIGH (ref 4.0–10.5)
nRBC: 0 % (ref 0.0–0.2)

## 2020-09-29 LAB — HCG, QUANTITATIVE, PREGNANCY: hCG, Beta Chain, Quant, S: 2 m[IU]/mL (ref <5)

## 2020-09-29 LAB — POCT PREGNANCY, URINE: Preg Test, Ur: NEGATIVE

## 2020-09-29 MED ORDER — PREPLUS 27-1 MG PO TABS: 1.0000 | ORAL_TABLET | Freq: Every day | ORAL | 13 refills | Status: DC

## 2020-09-29 NOTE — MAU Provider Note (Signed)
First Provider Initiated Contact with Patient 09/29/20 [phone]      S Ms. Angela Henson is a 29 y.o. [id number] patient who presents to MAU today with complaint of vaginal bleeding in setting of positive home UPT x 6.  Patient states the bleeding started last night and has been heavy, but without clots.  She endorses some mild cramping.  Patient extremely upset and states she desires pregnancy greatly.  Reports recent loss in July.  O BP 119/68   Pulse 96   Temp 98.7 F (37.1 C)   Resp 19   Wt 54.9 kg   LMP 08/28/2020 (Exact Date)   BMI 23.63 kg/m  Physical Exam Vitals and nursing note reviewed.  Constitutional:      General: She is in acute distress.     Appearance: She is well-developed.  HENT:     Head: Normocephalic and atraumatic.  Eyes:     Conjunctiva/sclera: Conjunctivae normal.  Cardiovascular:     Rate and Rhythm: Normal rate.  Pulmonary:     Effort: Pulmonary effort is normal. No respiratory distress.  Musculoskeletal:     Cervical back: Normal range of motion.  Skin:    General: Skin is warm and dry.  Neurological:     Mental Status: She is alert.  Psychiatric:        Mood and Affect: Mood normal. Affect is labile.        Speech: Speech normal.        Behavior: Behavior normal.        Thought Content: Thought content normal.    Results for orders placed or performed during the hospital encounter of 09/29/20 (from the past 24 hour(s))  Urinalysis, Routine w reflex microscopic Urine, Clean Catch     Status: Abnormal   Collection Time: 09/29/20  8:45 AM  Result Value Ref Range   Color, Urine YELLOW YELLOW   APPearance HAZY (A) CLEAR   Specific Gravity, Urine 1.031 (H) 1.005 - 1.030   pH 5.0 5.0 - 8.0   Glucose, UA NEGATIVE NEGATIVE mg/dL   Hgb urine dipstick LARGE (A) NEGATIVE   Bilirubin Urine NEGATIVE NEGATIVE   Ketones, ur NEGATIVE NEGATIVE mg/dL   Protein, ur 976 (A) NEGATIVE mg/dL   Nitrite NEGATIVE NEGATIVE   Leukocytes,Ua NEGATIVE NEGATIVE   RBC /  HPF >50 (H) 0 - 5 RBC/hpf   WBC, UA 6-10 0 - 5 WBC/hpf   Bacteria, UA NONE SEEN NONE SEEN   Squamous Epithelial / LPF 0-5 0 - 5   Mucus PRESENT   Pregnancy, urine POC     Status: None   Collection Time: 09/29/20  8:49 AM  Result Value Ref Range   Preg Test, Ur NEGATIVE NEGATIVE  hCG, quantitative, pregnancy     Status: None   Collection Time: 09/29/20  9:00 AM  Result Value Ref Range   hCG, Beta Chain, Quant, S 2 <5 mIU/mL  CBC     Status: Abnormal   Collection Time: 09/29/20  9:02 AM  Result Value Ref Range   WBC 10.6 (H) 4.0 - 10.5 K/uL   RBC 4.67 3.87 - 5.11 MIL/uL   Hemoglobin 13.4 12.0 - 15.0 g/dL   HCT 73.4 36 - 46 %   MCV 90.8 80.0 - 100.0 fL   MCH 28.7 26.0 - 34.0 pg   MCHC 31.6 30.0 - 36.0 g/dL   RDW 19.3 79.0 - 24.0 %   Platelets 288 150 - 400 K/uL   nRBC 0.0 0.0 -  0.2 %    A Vaginal Bleeding Negative UPT Medical screening exam complete   P Patient informed of negative UPT today.  Reviewed option of hCG level draw today and agreeable. Reassurance and comfort given. Discussed chemical pregnancy and how this can effect UPT. Patient verbalizes understanding and has no q/c. Instructed to go home and be with family and provider will call with results.  Patient agreeable. Discharge from MAU in stable condition   Gerrit Heck, [place] 09/29/2020 8:59 AM    Addendum (2:49 PM)  -Results return as above. -Patient contacted and informed of hCG <2. 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 -Patient informed that provider will send script for PNV to pharmacy on file.  Patient confirms pharmacy location. -Patient encouraged to contact office if questions or concerns arise.  Cherre Robins MSN, CNM Advanced Practice Provider, [facility] [employer] [facility]

## 2020-09-29 NOTE — Discharge Instructions (Signed)
Home Pregnancy Test Information ° °A home pregnancy test helps you determine whether you are pregnant or not. There are several types of home pregnancy tests that can be bought at a grocery store or pharmacy. °What is being tested? °A home pregnancy test detects the presence of a hormone in your urine. The hormone is produced by cells of the placenta (human chorionic gonadotropin, or hCG). The placenta is the organ that forms to nourish and support a developing baby. °How are pregnancy tests done? °Home pregnancy tests require a urine sample. °· Most kits use a plastic testing device with a strip of paper that indicates whether there is hCG in your urine. °· Follow the test package instructions very carefully for how to test your urine. Depending on the test, you may need to: °? Urinate directly onto the stick. °? Urinate into a cup. °· Wait for the results as directed by the package instructions. The amount of time may be different for each type of test. °· Follow the test package instructions for how to read your test results. Depending on the test, results may be displayed as: °? A plus or a minus sign. °? One or two lines. °? "Pregnant" or "not pregnant." °· For best results, use your first urine of the morning. That is when the concentration of hCG is highest. °How accurate are home pregnancy tests? °Home pregnancy tests are very accurate when: °· You are at least 3-[redacted] weeks pregnant. °· It has been 1-2 weeks since your missed period. °· You use the test according to the package instructions. °What can interfere with home pregnancy test results? °Sometimes, a home pregnancy test may report that you are pregnant when you are not pregnant (false-positive result). This can happen if you: °· Are taking certain medicines, such as: °? Medicine to control seizures. °? Anti-anxiety medicine. °? Fertility medicine with hCG. °· Have a medical condition that affects your hormone levels. °· Had a recent pregnancy loss  (miscarriage) or abortion. °Sometimes, a home pregnancy test may report that you are not pregnant when you are pregnant (false-negative result). This can happen if you: °· Took the test too early in your pregnancy. Before 3-4 weeks of pregnancy, there may not be enough hCG to detect. °· Drank a lot of liquid before the test. °· Used an expired pregnancy test. °· Are taking certain medicines, such as antihistamines or water pills (diuretics). °What should I do if I have a positive pregnancy test? °If you have a positive home pregnancy test, schedule an appointment with your health care provider. You might need additional testing to confirm the pregnancy. °What should I do if I have a negative pregnancy test? °If you have a negative home pregnancy test but still have symptoms of pregnancy, contact your health care provider. Your health care provider will test a sample of your blood to check for pregnancy. In some cases, a blood test will return a positive result even if a urine test was negative because blood tests are more sensitive. This means blood tests can detect hCG earlier than home pregnancy tests. °Follow these instructions at home: °If you are pregnant, planning to become pregnant, or think you may be pregnant: °· Do not drink alcohol. °· Do not use street drugs. °· Do not use any products that contain nicotine or tobacco, such as cigarettes and e-cigarettes. If you need help quitting, ask your health care provider. °· Take a prenatal vitamin that contains at least 400 mcg of folic   acid daily. °Summary °· A home pregnancy test helps you determine whether you are pregnant or not by detecting the presence of the hormone human chorionic gonadotropin (hCG) in a sample of your urine. °· Follow the test package instructions very carefully. For best results, use your first urine of the morning. That is when the concentration of hCG is highest. °· Home pregnancy tests are very accurate when you are 3-[redacted] weeks  pregnant or when it has been 1-2 weeks since your missed period. °· A home pregnancy test may report that you are pregnant when you are not pregnant or that you are not pregnant when you are pregnant. °· Contact your health care provider to confirm your results. Your health care provider will test a sample of your blood to check for pregnancy. °This information is not intended to replace advice given to you by your health care provider. Make sure you discuss any questions you have with your health care provider. °Document Revised: 01/31/2019 Document Reviewed: 10/23/2017 °Elsevier Patient Education © 2020 [employer] Inc[employer] ° °

## 2020-09-29 NOTE — MAU Note (Addendum)
Patient started having cramping last night and then vaginal bleeding started afterwards-bright red, not filling up a pad.  States she has worn 2 pantiliners through the night.  LMP 11/5.  Had 6 positive HPTs at home

## 2020-10-26 ENCOUNTER — Inpatient Hospital Stay (HOSPITAL_COMMUNITY)
Admission: AD | Admit: 2020-10-26 | Discharge: 2020-10-26 | Disposition: A | Discharge status: Home / Self Care | Payer: Medicaid Other | Attending: Family Medicine | Admitting: Family Medicine

## 2020-10-26 ENCOUNTER — Inpatient Hospital Stay (HOSPITAL_COMMUNITY): Payer: Medicaid Other

## 2020-10-26 ENCOUNTER — Encounter (HOSPITAL_COMMUNITY): Payer: Self-pay | Admitting: Family Medicine

## 2020-10-26 ENCOUNTER — Other Ambulatory Visit: Payer: Self-pay

## 2020-10-26 DIAGNOSIS — M549 Dorsalgia, unspecified: Secondary | ICD-10-CM

## 2020-10-26 DIAGNOSIS — O99891 Other specified diseases and conditions complicating pregnancy: Secondary | ICD-10-CM

## 2020-10-26 DIAGNOSIS — Z3A01 Less than 8 weeks gestation of pregnancy: Secondary | ICD-10-CM | POA: Diagnosis not present

## 2020-10-26 DIAGNOSIS — O26891 Other specified pregnancy related conditions, first trimester: Secondary | ICD-10-CM | POA: Insufficient documentation

## 2020-10-26 DIAGNOSIS — Z3201 Encounter for pregnancy test, result positive: Secondary | ICD-10-CM | POA: Diagnosis not present

## 2020-10-26 DIAGNOSIS — M545 Low back pain, unspecified: Secondary | ICD-10-CM

## 2020-10-26 LAB — HCG, QUANTITATIVE, PREGNANCY: hCG, Beta Chain, Quant, S: 81 m[IU]/mL — ABNORMAL HIGH (ref <5)

## 2020-10-26 LAB — CBC WITH DIFFERENTIAL/PLATELET
Abs Immature Granulocytes: 0.02 10*3/uL (ref 0.00–0.07)
Basophils Absolute: 0 10*3/uL (ref 0.0–0.1)
Basophils Relative: 0 %
Eosinophils Absolute: 0.3 10*3/uL (ref 0.0–0.5)
Eosinophils Relative: 3 %
HCT: 43.3 % (ref 36.0–46.0)
Hemoglobin: 14.2 g/dL (ref 12.0–15.0)
Immature Granulocytes: 0 %
Lymphocytes Relative: 43 %
Lymphs Abs: 4.3 10*3/uL — ABNORMAL HIGH (ref 0.7–4.0)
MCH: 28.9 pg (ref 26.0–34.0)
MCHC: 32.8 g/dL (ref 30.0–36.0)
MCV: 88.2 fL (ref 80.0–100.0)
Monocytes Absolute: 0.8 10*3/uL (ref 0.1–1.0)
Monocytes Relative: 8 %
Neutro Abs: 4.7 10*3/uL (ref 1.7–7.7)
Neutrophils Relative %: 46 %
Platelets: 306 10*3/uL (ref 150–400)
RBC: 4.91 MIL/uL (ref 3.87–5.11)
RDW: 13.7 % (ref 11.5–15.5)
WBC: 10 10*3/uL (ref 4.0–10.5)
nRBC: 0 % (ref 0.0–0.2)

## 2020-10-26 LAB — COMPREHENSIVE METABOLIC PANEL
ALT: 40 U/L (ref 0–44)
AST: 34 U/L (ref 15–41)
Albumin: 4.6 g/dL (ref 3.5–5.0)
Alkaline Phosphatase: 62 U/L (ref 38–126)
Anion gap: 8 (ref 5–15)
BUN: 15 mg/dL (ref 6–20)
CO2: 25 mmol/L (ref 22–32)
Calcium: 9.5 mg/dL (ref 8.9–10.3)
Chloride: 102 mmol/L (ref 98–111)
Creatinine, Ser: 0.7 mg/dL (ref 0.44–1.00)
GFR, Estimated: >60 mL/min (ref >60)
Glucose, Bld: 94 mg/dL (ref 70–99)
Potassium: 4 mmol/L (ref 3.5–5.1)
Sodium: 135 mmol/L (ref 135–145)
Total Bilirubin: 0.7 mg/dL (ref 0.3–1.2)
Total Protein: 8 g/dL (ref 6.5–8.1)

## 2020-10-26 LAB — URINALYSIS, ROUTINE W REFLEX MICROSCOPIC
Bilirubin Urine: NEGATIVE
Glucose, UA: NEGATIVE mg/dL
Hgb urine dipstick: NEGATIVE
Ketones, ur: 5 mg/dL — AB
Leukocytes,Ua: NEGATIVE
Nitrite: NEGATIVE
Protein, ur: NEGATIVE mg/dL
Specific Gravity, Urine: 1.031 — ABNORMAL HIGH (ref 1.005–1.030)
pH: 5 (ref 5.0–8.0)

## 2020-10-26 NOTE — MAU Note (Signed)
Was having some back pain.  +HPT last wk, another + test today.  Has had 2 previous miscarriages, states  the last was 12/7.   Doesn't understand that visit as she was told she was not pregnant.  Denies bleeding.

## 2020-10-26 NOTE — Discharge Instructions (Signed)

## 2020-10-26 NOTE — MAU Provider Note (Signed)
S Ms. Angela Henson is a 30 y.o. [phone] pregnant female at [redacted]w[redacted]d by LMP who presents to MAU today with complaint of back pain and concern for possible SAB since she has a history. She denies vaginal bleeding or discharge.   O BP (!) 114/52 (BP Location: Right Arm)   Pulse (!) 104   Temp 98.8 F (37.1 C) (Oral)   Resp 18   Ht 5' (1.524 m)   Wt 120 lb 1.6 oz (54.5 kg)   LMP 09/29/2020   SpO2 100%   BMI 23.46 kg/m  Physical Exam Vitals and nursing note reviewed.  Constitutional:      General: She is not in acute distress.    Appearance: Normal appearance. She is normal weight. She is not ill-appearing.  HENT:     Mouth/Throat:     Mouth: Mucous membranes are moist.  Eyes:     Pupils: Pupils are equal, round, and reactive to light.  Cardiovascular:     Rate and Rhythm: Normal rate.     Pulses: Normal pulses.  Pulmonary:     Effort: Pulmonary effort is normal.  Abdominal:     Palpations: Abdomen is soft.  Musculoskeletal:        General: Normal range of motion.  Skin:    General: Skin is warm and dry.     Capillary Refill: Capillary refill takes less than 2 seconds.  Neurological:     Mental Status: She is alert and oriented to person, place, and time.  Psychiatric:        Mood and Affect: Mood normal.        Behavior: Behavior normal.        Thought Content: Thought content normal.        Judgment: Judgment normal.    Results for orders placed or performed during the hospital encounter of 10/26/20 (from the past 24 hour(s))  Urinalysis, Routine w reflex microscopic     Status: Abnormal   Collection Time: 10/26/20  5:09 PM  Result Value Ref Range   Color, Urine YELLOW YELLOW   APPearance HAZY (A) CLEAR   Specific Gravity, Urine 1.031 (H) 1.005 - 1.030   pH 5.0 5.0 - 8.0   Glucose, UA NEGATIVE NEGATIVE mg/dL   Hgb urine dipstick NEGATIVE NEGATIVE   Bilirubin Urine NEGATIVE NEGATIVE   Ketones, ur 5 (A) NEGATIVE mg/dL   Protein, ur NEGATIVE NEGATIVE mg/dL   Nitrite  NEGATIVE NEGATIVE   Leukocytes,Ua NEGATIVE NEGATIVE  CBC with Differential/Platelet     Status: Abnormal   Collection Time: 10/26/20  6:06 PM  Result Value Ref Range   WBC 10.0 4.0 - 10.5 K/uL   RBC 4.91 3.87 - 5.11 MIL/uL   Hemoglobin 14.2 12.0 - 15.0 g/dL   HCT 53.9 76.7 - 34.1 %   MCV 88.2 80.0 - 100.0 fL   MCH 28.9 26.0 - 34.0 pg   MCHC 32.8 30.0 - 36.0 g/dL   RDW 93.7 90.2 - 40.9 %   Platelets 306 150 - 400 K/uL   nRBC 0.0 0.0 - 0.2 %   Neutrophils Relative % 46 %   Neutro Abs 4.7 1.7 - 7.7 K/uL   Lymphocytes Relative 43 %   Lymphs Abs 4.3 (H) 0.7 - 4.0 K/uL   Monocytes Relative 8 %   Monocytes Absolute 0.8 0.1 - 1.0 K/uL   Eosinophils Relative 3 %   Eosinophils Absolute 0.3 0.0 - 0.5 K/uL   Basophils Relative 0 %   Basophils Absolute  0.0 0.0 - 0.1 K/uL   Immature Granulocytes 0 %   Abs Immature Granulocytes 0.02 0.00 - 0.07 K/uL  Comprehensive metabolic panel     Status: None   Collection Time: 10/26/20  6:06 PM  Result Value Ref Range   Sodium 135 135 - 145 mmol/L   Potassium 4.0 3.5 - 5.1 mmol/L   Chloride 102 98 - 111 mmol/L   CO2 25 22 - 32 mmol/L   Glucose, Bld 94 70 - 99 mg/dL   BUN 15 6 - 20 mg/dL   Creatinine, Ser 2.83 0.44 - 1.00 mg/dL   Calcium 9.5 8.9 - 15.1 mg/dL   Total Protein 8.0 6.5 - 8.1 g/dL   Albumin 4.6 3.5 - 5.0 g/dL   AST 34 15 - 41 U/L   ALT 40 0 - 44 U/L   Alkaline Phosphatase 62 38 - 126 U/L   Total Bilirubin 0.7 0.3 - 1.2 mg/dL   GFR, Estimated >76 >16 mL/min   Anion gap 8 5 - 15  hCG, quantitative, pregnancy     Status: Abnormal   Collection Time: 10/26/20  6:06 PM  Result Value Ref Range   hCG, Beta Chain, Quant, S 81 (H) <5 mIU/mL   HCG came back elevated, offered U/S, patient declined but agreed to repeat quant in 48hrs.  A Encounter for pregnancy test Back pain Medical screening exam complete  P Discharge from MAU in stable condition with return precautions Return to MAU on 10/29/19 after 5pm Warning signs for worsening  condition that would warrant emergency follow-up discussed Patient may return to MAU as needed for emergent OB/GYN related complaints  Bernerd Limbo, CNM 10/26/2020 8:07 PM

## 2020-10-27 LAB — POCT PREGNANCY, URINE: Preg Test, Ur: POSITIVE — AB

## 2020-10-28 ENCOUNTER — Inpatient Hospital Stay (HOSPITAL_COMMUNITY)
Admission: AD | Admit: 2020-10-28 | Discharge: 2020-10-28 | Disposition: A | Discharge status: Home / Self Care | Payer: Medicaid Other | Attending: Obstetrics & Gynecology | Admitting: Obstetrics & Gynecology

## 2020-10-28 ENCOUNTER — Other Ambulatory Visit: Payer: Self-pay

## 2020-10-28 DIAGNOSIS — O99891 Other specified diseases and conditions complicating pregnancy: Secondary | ICD-10-CM | POA: Diagnosis not present

## 2020-10-28 DIAGNOSIS — Z3A01 Less than 8 weeks gestation of pregnancy: Secondary | ICD-10-CM

## 2020-10-28 DIAGNOSIS — M549 Dorsalgia, unspecified: Secondary | ICD-10-CM

## 2020-10-28 DIAGNOSIS — Z87891 Personal history of nicotine dependence: Secondary | ICD-10-CM | POA: Diagnosis not present

## 2020-10-28 DIAGNOSIS — O26891 Other specified pregnancy related conditions, first trimester: Secondary | ICD-10-CM | POA: Insufficient documentation

## 2020-10-28 LAB — HCG, QUANTITATIVE, PREGNANCY: hCG, Beta Chain, Quant, S: 252 m[IU]/mL — ABNORMAL HIGH (ref <5)

## 2020-10-28 NOTE — MAU Note (Signed)
Here for repeat BHCG. Denies any pain or bleeding.

## 2020-10-28 NOTE — MAU Note (Addendum)
Lisa Leftwich-Kirby CNM in Family Rm to see pt and discuss plan of care. Pt would like to leave and be called with results. CNM and pt discussed this and CNM agrees pt may leave and she will call pt with results and plan of care after results are back. Pt d/c home from family room

## 2020-10-28 NOTE — MAU Provider Note (Signed)
Chief Complaint: Follow-up   Event Date/Time   First Provider Initiated Contact with Patient 10/28/20 1937      SUBJECTIVE HPI: Angela Henson is a 30 y.o. [id number] at [redacted]w[redacted]d by LMP who presents to maternity admissions for follow up hcg. She initially presented on 10/26/20 with back pain and declined [place] but had hcg level drawn. Today, she denies any pain or bleeding.  Quant hcg on 10/26/20 was 81.   HPI  Past Medical History:  Diagnosis Date   Headache(784.0)    Hx of chlamydia infection    Hx of varicella    Past Surgical History:  Procedure Laterality Date   CESAREAN SECTION  11/03/2012   Procedure: CESAREAN SECTION;  Surgeon: Bing Plume, MD;  Location: [facility];  Service: Obstetrics;  Laterality: N/A;  Primary cesarean section with delivery of baby boy at 21. Apgars 8/9.   CESAREAN SECTION N/A 03/22/2016   Procedure: REPEAT CESAREAN SECTION;  Surgeon: [name], DO;  Location: [facility];  Service: Obstetrics;  Laterality: N/A;  Heather K to RNFA- confirmed   INCISION AND DRAINAGE ABSCESS ANAL  2013   I&D of right buttock   WISDOM TOOTH EXTRACTION     Social History   Socioeconomic History   Marital status: Single    Spouse name: Not on file   Number of children: Not on file   Years of education: Not on file   Highest education level: Not on file  Occupational History   Not on file  Tobacco Use   Smoking status: Former Smoker    Packs/day: 0.50    Years: 8.00    Pack years: 4.00    Types: Cigarettes    Quit date: 02/09/2016    Years since quitting: 4.7   Smokeless tobacco: Never Used  Substance and Sexual Activity   Alcohol use: No   Drug use: No    Comment: x pills and molly 2013   Sexual activity: Yes    Birth control/protection: Condom  Other Topics Concern   Not on file  Social History Narrative   Not on file   Social Determinants of Health   Financial Resource Strain: Not on file  Food Insecurity: Not on file   Transportation Needs: Not on file  Physical Activity: Not on file  Stress: Not on file  Social Connections: Not on file  Intimate Partner Violence: Not on file   No current facility-administered medications on file prior to encounter.   Current Outpatient Medications on File Prior to Encounter  Medication Sig Dispense Refill   acetaminophen (TYLENOL) 500 MG tablet Take 500 mg by mouth every 6 (six) hours as needed.     Prenatal Vit-Fe Fumarate-FA (PREPLUS) 27-1 MG TABS Take 1 tablet by mouth daily. 30 tablet 13   No Known Allergies  ROS:  Review of Systems  Constitutional: Negative for chills, fatigue and fever.  Respiratory: Negative for shortness of breath.   Cardiovascular: Negative for chest pain.  Genitourinary: Negative for difficulty urinating, dysuria, flank pain, pelvic pain, vaginal bleeding, vaginal discharge and vaginal pain.  Neurological: Negative for dizziness and headaches.  Psychiatric/Behavioral: Negative.      I have reviewed patient's Past Medical Hx, Surgical Hx, Family Hx, Social Hx, medications and allergies.   Physical Exam   Patient Vitals for the past 24 hrs:  BP Temp Pulse Resp Height Weight  10/28/20 1924 111/63 98.9 F (37.2 C) (!) 110 16 5' (1.524 m) 54.4 kg   Constitutional: Well-developed,  well-nourished female in no acute distress.  Cardiovascular: normal rate Respiratory: normal effort GI: Abd soft, non-tender. Pos BS x 4 MS: Extremities nontender, no edema, normal ROM Neurologic: Alert and oriented x 4.  GU: Neg CVAT.  PELVIC EXAM: Deferred  LAB RESULTS     Results for orders placed or performed during the hospital encounter of 10/28/20 (from the past 72 hour(s))  hCG, quantitative, pregnancy     Status: Abnormal   Collection Time: 10/28/20  7:32 PM  Result Value Ref Range   hCG, Beta Chain, Quant, S 252 (H) <5 mIU/mL    Comment:          GEST. AGE      CONC.  (mIU/mL)   <=1 WEEK        5 - 50     2 WEEKS       50 - 500      3 WEEKS       100 - 10,000     4 WEEKS     1,000 - 30,000     5 WEEKS     3,500 - 115,000   6-8 WEEKS     12,000 - 270,000    12 WEEKS     15,000 - 220,000        FEMALE AND NON-PREGNANT FEMALE:     LESS THAN 5 mIU/mL Performed at [facility], [address], [place], [place] [address]     IMAGING No results found.  MAU Management/MDM: Orders Placed This Encounter  Procedures   hCG, quantitative, pregnancy   Discharge patient    No orders of the defined types were placed in this encounter.   Hcg rose appropriately today. Given low number, I recommend repeat hcg on Monday in the office.  Message sent to set up repeat lab in the office.  If appropriate rise continues, outpatient [place] in 1 week. Ectopic precautions and warning signs reviewed with the pt.    ASSESSMENT 1. Back pain affecting pregnancy in first trimester     PLAN Discharge home Allergies as of 10/28/2020   No Known Allergies     Medication List    TAKE these medications   acetaminophen 500 MG tablet Commonly known as: TYLENOL Take 500 mg by mouth every 6 (six) hours as needed.   PrePLUS 27-1 MG Tabs Take 1 tablet by mouth daily.       Follow-up Information    Prenatal provider of your choice Follow up.               Sharen Counter Certified Nurse-Midwife 10/28/2020  7:38 PM

## 2020-11-02 ENCOUNTER — Other Ambulatory Visit: Payer: Self-pay

## 2020-11-25 ENCOUNTER — Telehealth: Payer: Self-pay | Admitting: *Deleted

## 2020-11-25 DIAGNOSIS — O3680X0 Pregnancy with inconclusive fetal viability, not applicable or unspecified: Secondary | ICD-10-CM

## 2020-11-25 NOTE — Telephone Encounter (Signed)
Called pt regarding missed appt on 1/5 for BHCG. I explained the purpose of her previous appointment was to check and be sure that her hormone level was continuing to rise appropriately. I then stated that we would still like to perform this test in order to give her the best care. Pt reports that she has not been having vaginal bleeding or abdominal cramping. Pt voiced understanding of information provided and agreed to appt on 2/3 @ 1:30 pm.

## 2020-11-26 ENCOUNTER — Other Ambulatory Visit: Payer: Self-pay

## 2020-11-26 ENCOUNTER — Other Ambulatory Visit: Payer: Medicaid Other

## 2020-11-26 DIAGNOSIS — O3680X0 Pregnancy with inconclusive fetal viability, not applicable or unspecified: Secondary | ICD-10-CM

## 2020-11-27 ENCOUNTER — Telehealth: Payer: Self-pay

## 2020-11-27 LAB — BETA HCG QUANT (REF LAB): hCG Quant: 107070 m[IU]/mL

## 2020-11-27 NOTE — Telephone Encounter (Signed)
Called pt and informed her results of beta and start OB.  Pt reports that she has started care with [facility] and that she has an U/S scheduled on 12/09/20.  Pt advised that she will direct questions/concerns of her pregnancy to [facility].  Pt verbalized understanding.   Addison Naegeli, RN 11/27/20

## 2020-12-09 LAB — OB RESULTS CONSOLE GC/CHLAMYDIA
Chlamydia: NEGATIVE
Gonorrhea: NEGATIVE

## 2020-12-09 LAB — OB RESULTS CONSOLE ANTIBODY SCREEN: Antibody Screen: NEGATIVE

## 2020-12-09 LAB — OB RESULTS CONSOLE ABO/RH: RH Type: POSITIVE

## 2020-12-09 LAB — OB RESULTS CONSOLE HEPATITIS B SURFACE ANTIGEN: Hepatitis B Surface Ag: NEGATIVE

## 2020-12-09 LAB — OB RESULTS CONSOLE HIV ANTIBODY (ROUTINE TESTING): HIV: NONREACTIVE

## 2020-12-09 LAB — OB RESULTS CONSOLE RPR: RPR: NONREACTIVE

## 2020-12-09 LAB — OB RESULTS CONSOLE RUBELLA ANTIBODY, IGM: Rubella: IMMUNE

## 2021-05-19 ENCOUNTER — Encounter: Payer: Medicaid Other | Admitting: Registered"

## 2021-06-06 ENCOUNTER — Other Ambulatory Visit: Payer: Self-pay

## 2021-06-06 ENCOUNTER — Inpatient Hospital Stay (HOSPITAL_COMMUNITY)
Admission: AD | Admit: 2021-06-06 | Discharge: 2021-06-06 | Discharge status: Against Medical Advice | Payer: Medicaid Other | Attending: Obstetrics and Gynecology | Admitting: Obstetrics and Gynecology

## 2021-06-06 ENCOUNTER — Encounter (HOSPITAL_COMMUNITY): Payer: Self-pay | Admitting: Obstetrics and Gynecology

## 2021-06-06 DIAGNOSIS — Z87891 Personal history of nicotine dependence: Secondary | ICD-10-CM | POA: Diagnosis not present

## 2021-06-06 DIAGNOSIS — E860 Dehydration: Secondary | ICD-10-CM | POA: Diagnosis not present

## 2021-06-06 DIAGNOSIS — O99283 Endocrine, nutritional and metabolic diseases complicating pregnancy, third trimester: Secondary | ICD-10-CM

## 2021-06-06 DIAGNOSIS — Z3689 Encounter for other specified antenatal screening: Secondary | ICD-10-CM

## 2021-06-06 DIAGNOSIS — Z79899 Other long term (current) drug therapy: Secondary | ICD-10-CM | POA: Insufficient documentation

## 2021-06-06 DIAGNOSIS — Z3A35 35 weeks gestation of pregnancy: Secondary | ICD-10-CM

## 2021-06-06 DIAGNOSIS — O4703 False labor before 37 completed weeks of gestation, third trimester: Secondary | ICD-10-CM | POA: Diagnosis not present

## 2021-06-06 DIAGNOSIS — O4700 False labor before 37 completed weeks of gestation, unspecified trimester: Secondary | ICD-10-CM

## 2021-06-06 LAB — URINALYSIS, ROUTINE W REFLEX MICROSCOPIC
Bilirubin Urine: NEGATIVE
Glucose, UA: NEGATIVE mg/dL
Hgb urine dipstick: NEGATIVE
Ketones, ur: 20 mg/dL — AB
Leukocytes,Ua: NEGATIVE
Nitrite: NEGATIVE
Protein, ur: 30 mg/dL — AB
Specific Gravity, Urine: 1.024 (ref 1.005–1.030)
pH: 6 (ref 5.0–8.0)

## 2021-06-06 NOTE — MAU Provider Note (Signed)
History     CSN: [id number]  Arrival date and time: 06/06/21 1946   Event Date/Time   First Provider Initiated Contact with Patient 06/06/21 2112      Chief Complaint  Patient presents with   Contractions   30 y.o. [id number] @35 .5 wks presenting with ctx. Reports onset about 1 week ago but became more frequent today. States ctx are q5 min. Denies VB or LOF. Reports good FM. Admits to poor water intake, only 1 bottle today. Denies urinary sx.    OB History     Gravida  4   Para  2   Term  1   Preterm  1   AB  1   Living  2      SAB  1   IAB  0   Ectopic  0   Multiple  0   Live Births  2           Past Medical History:  Diagnosis Date   Headache(784.0)    Hx of chlamydia infection    Hx of varicella     Past Surgical History:  Procedure Laterality Date   CESAREAN SECTION  11/03/2012   Procedure: CESAREAN SECTION;  Surgeon: 01/01/2013, MD;  Location: [facility];  Service: Obstetrics;  Laterality: N/A;  Primary cesarean section with delivery of baby boy at 59. Apgars 8/9.   CESAREAN SECTION N/A 03/22/2016   Procedure: REPEAT CESAREAN SECTION;  Surgeon: 03/24/2016, DO;  Location: [facility];  Service: Obstetrics;  Laterality: N/A;  Heather K to RNFA- confirmed   INCISION AND DRAINAGE ABSCESS ANAL  2013   I&D of right buttock   WISDOM TOOTH EXTRACTION      Family History  Problem Relation Age of Onset   Diabetes Father    Hypotension Neg Hx    Asthma Neg Hx    Arthritis Neg Hx    Alcohol abuse Neg Hx    Birth defects Neg Hx    Cancer Neg Hx    COPD Neg Hx    Depression Neg Hx    Drug abuse Neg Hx    Early death Neg Hx    Hearing loss Neg Hx    Heart disease Neg Hx    Hyperlipidemia Neg Hx    Hypertension Neg Hx    Kidney disease Neg Hx    Mental illness Neg Hx    Learning disabilities Neg Hx    Mental retardation Neg Hx    Miscarriages / Stillbirths Neg Hx    Stroke Neg Hx    Vision loss Neg Hx    Varicose Veins Neg  Hx     Social History   Tobacco Use   Smoking status: Former    Packs/day: 0.50    Years: 8.00    Pack years: 4.00    Types: Cigarettes    Quit date: 02/09/2016    Years since quitting: 5.3   Smokeless tobacco: Never  Vaping Use   Vaping Use: Never used  Substance Use Topics   Alcohol use: No   Drug use: No    Comment: x pills and molly 2013    Allergies: No Known Allergies  Medications Prior to Admission  Medication Sig Dispense Refill Last Dose   Prenatal Vit-Fe Fumarate-FA (PREPLUS) 27-1 MG TABS Take 1 tablet by mouth daily. 30 tablet 13 06/05/2021   acetaminophen (TYLENOL) 500 MG tablet Take 500 mg by mouth every 6 (six) hours as needed.  Review of Systems  Gastrointestinal:  Positive for abdominal pain (ctx).  Genitourinary:  Negative for dysuria, frequency, urgency, vaginal bleeding and vaginal discharge.  Physical Exam   Blood pressure 114/66, pulse (!) 123, temperature 97.9 F (36.6 C), temperature source Oral, resp. rate 16, height 5' (1.524 m), weight 66.2 kg, last menstrual period 09/29/2020, SpO2 98 %, unknown if currently breastfeeding.  Physical Exam Vitals and nursing note reviewed.  Constitutional:      General: She is not in acute distress.    Appearance: Normal appearance.  HENT:     Head: Normocephalic and atraumatic.  Cardiovascular:     Rate and Rhythm: Normal rate.  Pulmonary:     Effort: Pulmonary effort is normal. No respiratory distress.  Abdominal:     Tenderness: There is no abdominal tenderness.     Comments: gravid  Musculoskeletal:        General: Normal range of motion.     Cervical back: Normal range of motion.  Skin:    General: Skin is warm and dry.  Neurological:     General: No focal deficit present.     Mental Status: She is alert and oriented to person, place, and time.  Psychiatric:        Mood and Affect: Mood normal.        Behavior: Behavior normal.  EFM: 135 bpm, mod variability, + accels, no decels Toco:  rare  Results for orders placed or performed during the hospital encounter of 06/06/21 (from the past 24 hour(s))  Urinalysis, Routine w reflex microscopic Urine, Clean Catch     Status: Abnormal   Collection Time: 06/06/21  8:52 PM  Result Value Ref Range   Color, Urine AMBER (A) YELLOW   APPearance HAZY (A) CLEAR   Specific Gravity, Urine 1.024 1.005 - 1.030   pH 6.0 5.0 - 8.0   Glucose, UA NEGATIVE NEGATIVE mg/dL   Hgb urine dipstick NEGATIVE NEGATIVE   Bilirubin Urine NEGATIVE NEGATIVE   Ketones, ur 20 (A) NEGATIVE mg/dL   Protein, ur 30 (A) NEGATIVE mg/dL   Nitrite NEGATIVE NEGATIVE   Leukocytes,Ua NEGATIVE NEGATIVE   RBC / HPF 0-5 0 - 5 RBC/hpf   WBC, UA 0-5 0 - 5 WBC/hpf   Bacteria, UA MANY (A) NONE SEEN   Squamous Epithelial / LPF 11-20 0 - 5   Mucus PRESENT    MAU Course  Procedures Po hydration  MDM Reviewed prenatal record: hx of previous CS x2, GDM, hx Hep C, hx depression, hx PTD, and hx PEC. Pt reports less frequent ctx since EFM applied. Pt requesting to wait for her husband to arrive before cervical check. Reports hx of trauma in past.  2150: Pt partner at bedside. Pt declining cervical exam. States ctx are less often now. Discussed with pt and partner that I cannot know for sure if she's in PTL without cervical exam. Pt does not want exam, and wants to leave AMA.   Assessment and Plan   1. [redacted] weeks gestation of pregnancy   2. NST (non-stress test) reactive   3. Preterm contractions   4. Dehydration    Left AMA Has follow up at [facility] this week   Allergies as of 06/06/2021   No Known Allergies      Medication List     TAKE these medications    acetaminophen 500 MG tablet Commonly known as: TYLENOL Take 500 mg by mouth every 6 (six) hours as needed.   PrePLUS 27-1 MG  Tabs Take 1 tablet by mouth daily.        Donette Larry, CNM 06/06/2021, 10:02 PM

## 2021-06-06 NOTE — MAU Note (Signed)
For the past week, contractions on and off.  Now every 3-4 min all day today.  No leaking. No bleeding. Baby moving well.

## 2021-06-08 LAB — CULTURE, OB URINE

## 2021-06-09 ENCOUNTER — Encounter: Payer: Medicaid Other | Attending: Obstetrics and Gynecology | Admitting: Registered"

## 2021-06-22 ENCOUNTER — Encounter (HOSPITAL_COMMUNITY): Payer: Self-pay

## 2021-06-22 NOTE — Patient Instructions (Signed)
Angela Henson  06/22/2021   Your procedure is scheduled on:  06/26/2021  Arrive at 0730 at Entrance C on [address] at [facility]  and [employer]. You are invited to use the FREE valet parking or use the Visitor's parking deck.  Pick up the phone at the desk and dial [phone].  Call this number if you have problems the morning of surgery: [phone]  Remember:   Do not eat food:(After Midnight) Desps de medianoche.  Do not drink clear liquids: (After Midnight) Desps de medianoche.  Take these medicines the morning of surgery with A SIP OF WATER:  none   Do not wear jewelry, make-up or nail polish.  Do not wear lotions, powders, or perfumes. Do not wear deodorant.  Do not shave 48 hours prior to surgery.  Do not bring valuables to the hospital.  [facility] is not   responsible for any belongings or valuables brought to the hospital.  Contacts, dentures or bridgework may not be worn into surgery.  Leave suitcase in the car. After surgery it may be brought to your room.  For patients admitted to the hospital, checkout time is 11:00 AM the day of              discharge.      Please read over the following fact sheets that you were given:     Preparing for Surgery

## 2021-06-24 ENCOUNTER — Other Ambulatory Visit: Payer: Self-pay | Admitting: Obstetrics and Gynecology

## 2021-06-24 ENCOUNTER — Other Ambulatory Visit: Payer: Self-pay

## 2021-06-24 ENCOUNTER — Encounter (HOSPITAL_COMMUNITY)
Admission: RE | Admit: 2021-06-24 | Discharge: 2021-06-24 | Disposition: A | Discharge status: Home / Self Care | Payer: Medicaid Other | Source: Ambulatory Visit | Attending: Obstetrics and Gynecology | Admitting: Obstetrics and Gynecology

## 2021-06-24 DIAGNOSIS — Z01812 Encounter for preprocedural laboratory examination: Secondary | ICD-10-CM | POA: Diagnosis present

## 2021-06-24 HISTORY — DX: Personal history of other complications of pregnancy, childbirth and the puerperium: Z87.59

## 2021-06-24 HISTORY — DX: Gestational diabetes mellitus in pregnancy, unspecified control: O24.419

## 2021-06-24 LAB — CBC
HCT: 37.3 % (ref 36.0–46.0)
Hemoglobin: 12.4 g/dL (ref 12.0–15.0)
MCH: 29.8 pg (ref 26.0–34.0)
MCHC: 33.2 g/dL (ref 30.0–36.0)
MCV: 89.7 fL (ref 80.0–100.0)
Platelets: 307 10*3/uL (ref 150–400)
RBC: 4.16 MIL/uL (ref 3.87–5.11)
RDW: 14.9 % (ref 11.5–15.5)
WBC: 13.8 10*3/uL — ABNORMAL HIGH (ref 4.0–10.5)
nRBC: 0 % (ref 0.0–0.2)

## 2021-06-24 LAB — TYPE AND SCREEN
ABO/RH(D): B POS
Antibody Screen: NEGATIVE

## 2021-06-24 LAB — SARS CORONAVIRUS 2 (TAT 6-24 HRS): SARS Coronavirus 2: NEGATIVE

## 2021-06-25 LAB — RPR: RPR Ser Ql: NONREACTIVE

## 2021-06-25 NOTE — Anesthesia Preprocedure Evaluation (Addendum)
Anesthesia Evaluation  Patient identified by MRN, date of birth, ID band Patient awake    Reviewed: Allergy & Precautions, NPO status , Patient's Chart, lab work & pertinent test results  Airway Mallampati: II  TM Distance: >3 FB Neck ROM: Full    Dental no notable dental hx. (+) Teeth Intact, Dental Advisory Given   Pulmonary former smoker,    Pulmonary exam normal breath sounds clear to auscultation       Cardiovascular Normal cardiovascular exam Rhythm:Regular Rate:Normal     Neuro/Psych  Headaches,    GI/Hepatic negative GI ROS, (+) Hepatitis -, C  Endo/Other  diabetes, Gestational  Renal/GU      Musculoskeletal   Abdominal   Peds  Hematology Lab Results      Component                Value               Date                      WBC                      13.8 (H)            06/24/2021                HGB                      12.4                06/24/2021                HCT                      37.3                06/24/2021                MCV                      89.7                06/24/2021                PLT                      307                 06/24/2021             T&S available   Anesthesia Other Findings   Reproductive/Obstetrics (+) Pregnancy                            Anesthesia Physical Anesthesia Plan  ASA: 2  Anesthesia Plan: Spinal   Post-op Pain Management:    Induction:   PONV Risk Score and Plan: Treatment may vary due to age or medical condition  Airway Management Planned: Natural Airway and Nasal Cannula  Additional Equipment:   Intra-op Plan:   Post-operative Plan:   Informed Consent: I have reviewed the patients History and Physical, chart, labs and discussed the procedure including the risks, benefits and alternatives for the proposed anesthesia with the patient or authorized representative who has indicated his/her understanding and acceptance.      Dental advisory given  Plan Discussed with:  Anesthesia Plan Comments: (38.6 WK G4P2 for Repeat C/S x3 )       Anesthesia Quick Evaluation

## 2021-06-26 ENCOUNTER — Inpatient Hospital Stay (HOSPITAL_COMMUNITY)
Admission: RE | Admit: 2021-06-26 | Discharge: 2021-06-28 | DRG: 787 | Disposition: A | Discharge status: Home / Self Care | Payer: Medicaid Other | Attending: Obstetrics and Gynecology | Admitting: Obstetrics and Gynecology

## 2021-06-26 ENCOUNTER — Inpatient Hospital Stay (HOSPITAL_COMMUNITY): Payer: Medicaid Other | Admitting: Anesthesiology

## 2021-06-26 ENCOUNTER — Encounter (HOSPITAL_COMMUNITY): Payer: Self-pay | Admitting: Obstetrics and Gynecology

## 2021-06-26 ENCOUNTER — Other Ambulatory Visit: Payer: Self-pay

## 2021-06-26 ENCOUNTER — Encounter (HOSPITAL_COMMUNITY)
Admission: RE | Disposition: A | Discharge status: Home / Self Care | Payer: Self-pay | Source: Home / Self Care | Attending: Obstetrics and Gynecology

## 2021-06-26 DIAGNOSIS — Z98891 History of uterine scar from previous surgery: Secondary | ICD-10-CM

## 2021-06-26 DIAGNOSIS — B1920 Unspecified viral hepatitis C without hepatic coma: Secondary | ICD-10-CM | POA: Diagnosis present

## 2021-06-26 DIAGNOSIS — O24420 Gestational diabetes mellitus in childbirth, diet controlled: Secondary | ICD-10-CM | POA: Diagnosis present

## 2021-06-26 DIAGNOSIS — O9842 Viral hepatitis complicating childbirth: Secondary | ICD-10-CM | POA: Diagnosis present

## 2021-06-26 DIAGNOSIS — Z87891 Personal history of nicotine dependence: Secondary | ICD-10-CM | POA: Diagnosis not present

## 2021-06-26 DIAGNOSIS — Z3A38 38 weeks gestation of pregnancy: Secondary | ICD-10-CM | POA: Diagnosis not present

## 2021-06-26 DIAGNOSIS — O34211 Maternal care for low transverse scar from previous cesarean delivery: Secondary | ICD-10-CM | POA: Diagnosis present

## 2021-06-26 DIAGNOSIS — O34219 Maternal care for unspecified type scar from previous cesarean delivery: Secondary | ICD-10-CM | POA: Diagnosis present

## 2021-06-26 LAB — GLUCOSE, CAPILLARY
Glucose-Capillary: 86 mg/dL (ref 70–99)
Glucose-Capillary: 104 mg/dL — ABNORMAL HIGH (ref 70–99)

## 2021-06-26 SURGERY — Surgical Case
Anesthesia: Spinal

## 2021-06-26 MED ORDER — OXYTOCIN-SODIUM CHLORIDE 30-0.9 UT/500ML-% IV SOLN
2.5000 [IU]/h | INTRAVENOUS | Status: AC
  Administered 2021-06-26: 2.5 [IU]/h via INTRAVENOUS
  Filled 2021-06-26: qty 500

## 2021-06-26 MED ORDER — KETOROLAC TROMETHAMINE 30 MG/ML IJ SOLN
INTRAMUSCULAR | Status: AC
  Filled 2021-06-26: qty 1

## 2021-06-26 MED ORDER — MENTHOL 3 MG MT LOZG: 1.0000 | LOZENGE | OROMUCOSAL | Status: DC | PRN

## 2021-06-26 MED ORDER — POVIDONE-IODINE 10 % EX SWAB
2.0000 "application " | Freq: Once | CUTANEOUS | Status: AC
  Administered 2021-06-26: 2 via TOPICAL

## 2021-06-26 MED ORDER — SIMETHICONE 80 MG PO CHEW
80.0000 mg | CHEWABLE_TABLET | ORAL | Status: DC | PRN
  Filled 2021-06-26: qty 1

## 2021-06-26 MED ORDER — FENTANYL CITRATE (PF) 100 MCG/2ML IJ SOLN
INTRAMUSCULAR | Status: DC | PRN
  Administered 2021-06-26: 15 ug via INTRATHECAL

## 2021-06-26 MED ORDER — OXYCODONE HCL 5 MG PO TABS
5.0000 mg | ORAL_TABLET | ORAL | Status: DC | PRN
  Administered 2021-06-26 – 2021-06-28 (×9): 10 mg via ORAL
  Filled 2021-06-26 (×9): qty 2

## 2021-06-26 MED ORDER — PRENATAL MULTIVITAMIN CH
1.0000 | ORAL_TABLET | Freq: Every day | ORAL | Status: DC
  Filled 2021-06-26: qty 1

## 2021-06-26 MED ORDER — HYDROMORPHONE HCL 1 MG/ML IJ SOLN
INTRAMUSCULAR | Status: AC
  Filled 2021-06-26: qty 0.5

## 2021-06-26 MED ORDER — ONDANSETRON HCL 4 MG/2ML IJ SOLN
INTRAMUSCULAR | Status: DC | PRN
  Administered 2021-06-26: 4 mg via INTRAVENOUS

## 2021-06-26 MED ORDER — COCONUT OIL OIL: 1.0000 "application " | TOPICAL_OIL | Status: DC | PRN

## 2021-06-26 MED ORDER — SIMETHICONE 80 MG PO CHEW
80.0000 mg | CHEWABLE_TABLET | Freq: Three times a day (TID) | ORAL | Status: DC
  Administered 2021-06-27 – 2021-06-28 (×3): 80 mg via ORAL
  Filled 2021-06-26 (×4): qty 1

## 2021-06-26 MED ORDER — CEFAZOLIN SODIUM-DEXTROSE 2-4 GM/100ML-% IV SOLN
2.0000 g | INTRAVENOUS | Status: AC
  Administered 2021-06-26: 2 g via INTRAVENOUS

## 2021-06-26 MED ORDER — IBUPROFEN 600 MG PO TABS: 600.0000 mg | ORAL_TABLET | Freq: Four times a day (QID) | ORAL | Status: DC

## 2021-06-26 MED ORDER — OXYCODONE HCL 5 MG PO TABS: 5.0000 mg | ORAL_TABLET | Freq: Once | ORAL | Status: DC | PRN

## 2021-06-26 MED ORDER — LACTATED RINGERS IV SOLN: INTRAVENOUS | Status: DC

## 2021-06-26 MED ORDER — MAGNESIUM HYDROXIDE 400 MG/5ML PO SUSP: 30.0000 mL | ORAL | Status: DC | PRN

## 2021-06-26 MED ORDER — MORPHINE SULFATE (PF) 0.5 MG/ML IJ SOLN
INTRAMUSCULAR | Status: DC | PRN
  Administered 2021-06-26: 150 ug via INTRATHECAL

## 2021-06-26 MED ORDER — FENTANYL CITRATE (PF) 100 MCG/2ML IJ SOLN
INTRAMUSCULAR | Status: DC | PRN
  Administered 2021-06-26: 50 ug via INTRAVENOUS
  Administered 2021-06-26: 35 ug via INTRAVENOUS

## 2021-06-26 MED ORDER — HYDROMORPHONE HCL 1 MG/ML IJ SOLN
0.2500 mg | INTRAMUSCULAR | Status: DC | PRN
  Administered 2021-06-26 (×3): 0.5 mg via INTRAVENOUS

## 2021-06-26 MED ORDER — ACETAMINOPHEN 500 MG PO TABS
1000.0000 mg | ORAL_TABLET | Freq: Four times a day (QID) | ORAL | Status: DC
  Administered 2021-06-26 – 2021-06-28 (×7): 1000 mg via ORAL
  Filled 2021-06-26 (×7): qty 2

## 2021-06-26 MED ORDER — SOD CITRATE-CITRIC ACID 500-334 MG/5ML PO SOLN
30.0000 mL | ORAL | Status: AC
  Administered 2021-06-26: 30 mL via ORAL

## 2021-06-26 MED ORDER — SOD CITRATE-CITRIC ACID 500-334 MG/5ML PO SOLN
ORAL | Status: AC
  Filled 2021-06-26: qty 30

## 2021-06-26 MED ORDER — STERILE WATER FOR IRRIGATION IR SOLN
Status: DC | PRN
  Administered 2021-06-26: 1

## 2021-06-26 MED ORDER — KETOROLAC TROMETHAMINE 30 MG/ML IJ SOLN
30.0000 mg | Freq: Four times a day (QID) | INTRAMUSCULAR | Status: AC
  Administered 2021-06-26 – 2021-06-27 (×4): 30 mg via INTRAVENOUS
  Filled 2021-06-26 (×3): qty 1

## 2021-06-26 MED ORDER — OXYCODONE HCL 5 MG/5ML PO SOLN: 5.0000 mg | Freq: Once | ORAL | Status: DC | PRN

## 2021-06-26 MED ORDER — ACETAMINOPHEN 500 MG PO TABS
ORAL_TABLET | ORAL | Status: AC
  Filled 2021-06-26: qty 2

## 2021-06-26 MED ORDER — IBUPROFEN 600 MG PO TABS
600.0000 mg | ORAL_TABLET | Freq: Four times a day (QID) | ORAL | Status: DC
  Administered 2021-06-27 – 2021-06-28 (×4): 600 mg via ORAL
  Filled 2021-06-26 (×4): qty 1

## 2021-06-26 MED ORDER — ZOLPIDEM TARTRATE 5 MG PO TABS
5.0000 mg | ORAL_TABLET | Freq: Every evening | ORAL | Status: DC | PRN
  Administered 2021-06-28: 5 mg via ORAL
  Filled 2021-06-26: qty 1

## 2021-06-26 MED ORDER — MORPHINE SULFATE (PF) 0.5 MG/ML IJ SOLN: INTRAMUSCULAR | Status: DC | PRN

## 2021-06-26 MED ORDER — DEXMEDETOMIDINE (PRECEDEX) IN NS 20 MCG/5ML (4 MCG/ML) IV SYRINGE
PREFILLED_SYRINGE | INTRAVENOUS | Status: AC
  Filled 2021-06-26: qty 5

## 2021-06-26 MED ORDER — PHENYLEPHRINE HCL-NACL 20-0.9 MG/250ML-% IV SOLN
INTRAVENOUS | Status: DC | PRN
  Administered 2021-06-26: 60 ug/min via INTRAVENOUS

## 2021-06-26 MED ORDER — PHENYLEPHRINE HCL-NACL 20-0.9 MG/250ML-% IV SOLN
INTRAVENOUS | Status: AC
  Filled 2021-06-26: qty 250

## 2021-06-26 MED ORDER — FENTANYL CITRATE (PF) 100 MCG/2ML IJ SOLN
INTRAMUSCULAR | Status: AC
  Filled 2021-06-26: qty 2

## 2021-06-26 MED ORDER — WITCH HAZEL-GLYCERIN EX PADS: 1.0000 "application " | MEDICATED_PAD | CUTANEOUS | Status: DC | PRN

## 2021-06-26 MED ORDER — DEXMEDETOMIDINE (PRECEDEX) IN NS 20 MCG/5ML (4 MCG/ML) IV SYRINGE
PREFILLED_SYRINGE | INTRAVENOUS | Status: DC | PRN
  Administered 2021-06-26 (×5): 4 ug via INTRAVENOUS

## 2021-06-26 MED ORDER — SODIUM CHLORIDE 0.9 % IR SOLN
Status: DC | PRN
  Administered 2021-06-26: 1

## 2021-06-26 MED ORDER — KETOROLAC TROMETHAMINE 30 MG/ML IJ SOLN: 30.0000 mg | Freq: Once | INTRAMUSCULAR | Status: DC

## 2021-06-26 MED ORDER — DIBUCAINE (PERIANAL) 1 % EX OINT: 1.0000 "application " | TOPICAL_OINTMENT | CUTANEOUS | Status: DC | PRN

## 2021-06-26 MED ORDER — ONDANSETRON HCL 4 MG/2ML IJ SOLN
INTRAMUSCULAR | Status: AC
  Filled 2021-06-26: qty 2

## 2021-06-26 MED ORDER — OXYTOCIN-SODIUM CHLORIDE 30-0.9 UT/500ML-% IV SOLN
INTRAVENOUS | Status: AC
  Filled 2021-06-26: qty 500

## 2021-06-26 MED ORDER — OXYTOCIN-SODIUM CHLORIDE 30-0.9 UT/500ML-% IV SOLN
INTRAVENOUS | Status: DC | PRN
  Administered 2021-06-26: 300 mL via INTRAVENOUS

## 2021-06-26 MED ORDER — KETOROLAC TROMETHAMINE 30 MG/ML IJ SOLN
30.0000 mg | Freq: Once | INTRAMUSCULAR | Status: AC | PRN
  Administered 2021-06-26: 30 mg via INTRAVENOUS

## 2021-06-26 MED ORDER — TETANUS-DIPHTH-ACELL PERTUSSIS 5-2.5-18.5 LF-MCG/0.5 IM SUSY: 0.5000 mL | PREFILLED_SYRINGE | Freq: Once | INTRAMUSCULAR | Status: DC

## 2021-06-26 MED ORDER — ACETAMINOPHEN 500 MG PO TABS
1000.0000 mg | ORAL_TABLET | ORAL | Status: AC
  Administered 2021-06-26: 1000 mg via ORAL

## 2021-06-26 MED ORDER — SENNOSIDES-DOCUSATE SODIUM 8.6-50 MG PO TABS
2.0000 | ORAL_TABLET | Freq: Every day | ORAL | Status: DC
  Administered 2021-06-27: 2 via ORAL
  Filled 2021-06-26 (×2): qty 2

## 2021-06-26 MED ORDER — CEFAZOLIN SODIUM-DEXTROSE 2-4 GM/100ML-% IV SOLN
INTRAVENOUS | Status: AC
  Filled 2021-06-26: qty 100

## 2021-06-26 MED ORDER — DIPHENHYDRAMINE HCL 25 MG PO CAPS: 25.0000 mg | ORAL_CAPSULE | Freq: Four times a day (QID) | ORAL | Status: DC | PRN

## 2021-06-26 MED ORDER — MORPHINE SULFATE (PF) 0.5 MG/ML IJ SOLN
INTRAMUSCULAR | Status: AC
  Filled 2021-06-26: qty 10

## 2021-06-26 MED ORDER — ONDANSETRON HCL 4 MG/2ML IJ SOLN: 4.0000 mg | Freq: Once | INTRAMUSCULAR | Status: DC | PRN

## 2021-06-26 SURGICAL SUPPLY — 38 items
APL SKNCLS STERI-STRIP NONHPOA (GAUZE/BANDAGES/DRESSINGS) ×1
BENZOIN TINCTURE PRP APPL 2/3 (GAUZE/BANDAGES/DRESSINGS) ×1 IMPLANT
CHLORAPREP W/TINT 26ML (MISCELLANEOUS) ×2 IMPLANT
CLAMP CORD UMBIL (MISCELLANEOUS) IMPLANT
CLOSURE STERI STRIP 1/2 X4 (GAUZE/BANDAGES/DRESSINGS) ×1 IMPLANT
CLOTH BEACON ORANGE TIMEOUT ST (SAFETY) ×2 IMPLANT
DRAPE C SECTION CLR SCREEN (DRAPES) ×2 IMPLANT
DRSG OPSITE POSTOP 4X10 (GAUZE/BANDAGES/DRESSINGS) ×2 IMPLANT
ELECT REM PT RETURN 9FT ADLT (ELECTROSURGICAL) ×2
ELECTRODE REM PT RTRN 9FT ADLT (ELECTROSURGICAL) ×1 IMPLANT
EXTRACTOR VACUUM KIWI (MISCELLANEOUS) IMPLANT
GLOVE BIO SURGEON STRL SZ 6.5 (GLOVE) ×2 IMPLANT
GLOVE BIOGEL PI IND STRL 7.0 (GLOVE) ×2 IMPLANT
GLOVE BIOGEL PI INDICATOR 7.0 (GLOVE) ×2
GOWN STRL REUS W/TWL LRG LVL3 (GOWN DISPOSABLE) ×4 IMPLANT
KIT ABG SYR 3ML LUER SLIP (SYRINGE) IMPLANT
NDL HYPO 25X5/8 SAFETYGLIDE (NEEDLE) IMPLANT
NEEDLE HYPO 25X5/8 SAFETYGLIDE (NEEDLE) IMPLANT
NS IRRIG 1000ML POUR BTL (IV SOLUTION) ×2 IMPLANT
PACK C SECTION WH (CUSTOM PROCEDURE TRAY) ×2 IMPLANT
PAD OB MATERNITY 4.3X12.25 (PERSONAL CARE ITEMS) ×2 IMPLANT
RETRACTOR WND ALEXIS 25 LRG (MISCELLANEOUS) ×1 IMPLANT
RTRCTR C-SECT PINK 25CM LRG (MISCELLANEOUS) IMPLANT
RTRCTR WOUND ALEXIS 25CM LRG (MISCELLANEOUS) ×2
STRIP CLOSURE SKIN 1/2X4 (GAUZE/BANDAGES/DRESSINGS) IMPLANT
SUT CHROMIC 1 CTX 36 (SUTURE) ×4 IMPLANT
SUT PLAIN 0 NONE (SUTURE) IMPLANT
SUT PLAIN 2 0 XLH (SUTURE) ×2 IMPLANT
SUT VIC AB 0 CT1 27 (SUTURE) ×4
SUT VIC AB 0 CT1 27XBRD ANBCTR (SUTURE) ×2 IMPLANT
SUT VIC AB 2-0 CT1 27 (SUTURE) ×2
SUT VIC AB 2-0 CT1 TAPERPNT 27 (SUTURE) ×1 IMPLANT
SUT VIC AB 3-0 CT1 27 (SUTURE)
SUT VIC AB 3-0 CT1 TAPERPNT 27 (SUTURE) IMPLANT
SUT VIC AB 4-0 KS 27 (SUTURE) ×2 IMPLANT
TOWEL OR 17X24 6PK STRL BLUE (TOWEL DISPOSABLE) ×2 IMPLANT
TRAY FOLEY W/BAG SLVR 14FR LF (SET/KITS/TRAYS/PACK) ×2 IMPLANT
WATER STERILE IRR 1000ML POUR (IV SOLUTION) ×2 IMPLANT

## 2021-06-26 NOTE — Transfer of Care (Signed)
Immediate Anesthesia Transfer of Care Note  Patient: Angela Henson  Procedure(s) Performed: CESAREAN SECTION  Patient Location: PACU  Anesthesia Type:Spinal  Level of Consciousness: awake  Airway & Oxygen Therapy: Patient Spontanous Breathing  Post-op Assessment: Report given to RN and Post -op Vital signs reviewed and stable  Post vital signs: Reviewed and stable  Last Vitals:  Vitals Value Taken Time  BP 92/70 06/26/21 1217  Temp 37.1 C 06/26/21 1217  Pulse 76 06/26/21 1217  Resp 18 06/26/21 1217  SpO2 99 % 06/26/21 1217    Last Pain:  Vitals:   06/26/21 1217  TempSrc: Oral  PainSc:          Complications: No notable events documented.

## 2021-06-26 NOTE — Op Note (Signed)
Operative Note    Preoperative Diagnosis IUP at 38 4/7wks Prior c/s x 2 GDM - not well controlled   Postoperative Diagnosis: Same   Procedure: Repeat cesarean section with double layered closure   Surgeon: Britt Bottom DO   Anesthesia: Spinal   Fluids: LR EBL: UOP:   Findings: Viable female infant in vertex position with double nuchal cord; APGARS 9,9 and weight pending Grossly normal uterus, tubes and ovaries    Specimen: Placenta to L/D   Procedure Note  Pt is a [id number] female at 25 4/7wks for repeat c/s due to uncontrolled DM. Pt has been on metformin with questionable compliance; FSBS elevated. Pt not inclined to consider use of insulin. Pt opted for repeat cesarean section. Consent verified  Consent verified pre-op. All questions answered   Patient was taken to the operating room where spinal anesthesia was administered. She was prepped and draped in the normal sterile fashion and placed in the dorsal supine position with a leftward tilt. An appropriate time out was performed. A Pfannenstiel skin incision was then made through the previous incision with the scalpel and carried through to the underlying layer of fascia by sharp dissection and Bovie cautery. The fascia was nicked in the midline and the incision was extended laterally with Mayo scissors. The superior and inferior aspects of the incision were grasped kocher clamps and dissected off the underlying rectus muscles.Rectus muscles were separated in the midline  and the peritoneal cavity entered bluntly. The peritoneal incision was then extended both superiorly and inferiorly with careful attention to avoid both bowel and bladder. The Alexis self-retaining wound retractor was then placed and the lower uterine segment exposed. The bladder flap was developed with Metzenbaum scissors and pushed away from the lower uterine segment. The lower uterine segment was then incised in a transverse fashion and the  cavity itself entered bluntly. The incision was extended bluntly. The infant's head was then lifted and delivered from the incision without difficulty. Vigorous spontaneous cry was noted during delivery.  Loose nuchal x 2 were reduced over infants head. The remainder of the infant delivered and the nose and mouth bulb suctioned. The cord was clamped and cut after a minute delay. The infant was handed off to the waiting pediatricians. The placenta was then spontaneously expressed from the uterus and the uterus cleared of all clots and debris with moist lap sponge. The uterine incision was then repaired in 2 layers the first layer was a running locked layer 1-0 chromic and the second an imbricating layer of the same suture. The tubes and ovaries were inspected and the gutters cleared of all clots and debris. The uterine incision was inspected and found to be hemostatic. All instruments and sponges were then removed from the abdomen. The fascia was then closed with 0 Vicryl in a running fashion. The skin was closed with a subcuticular stitch of 4-0 Vicryl on a Keith needle and then reinforced with benzoin and Steri-Strips. At the conclusion of the procedure all instruments and sponge counts were correct. Patient was taken to the recovery room in good condition with her baby accompanying her skin to skin.   They desire circumcision for baby

## 2021-06-26 NOTE — Progress Notes (Signed)
Patient refused fundal rub by RN  Patient educated regarding importance of fundal rubs and risks related to not performing fundal rubs  Patient states for RN to not touch her and to get away from her  Patient performed her own fundal rub per request    Mindi Slicker MD notified and aware

## 2021-06-26 NOTE — Progress Notes (Signed)
Pt still refusing fundal rub. Pt once again educated on importance and risks of not performing a fundal rub. Pt voiced understanding of importance of fundal rubs but stated she was in pain. IV pain medication given, see MAR. Pt allowed appropriate amount of time to allow pain medication to take effect. Pt agreed to allow RN to try to do fundal rub but then grabbed RN's arms preventing RN from initiating fundal rub. RN immediately stopped per Pt request. Mindi Slicker, MD aware and to come see patient in PACU. Will continue to monitor.

## 2021-06-26 NOTE — Anesthesia Postprocedure Evaluation (Signed)
Anesthesia Post Note  Patient: Angela Henson  Procedure(s) Performed: CESAREAN SECTION     Patient location during evaluation: Mother Baby Anesthesia Type: Spinal Level of consciousness: oriented and awake and alert Pain management: pain level controlled Vital Signs Assessment: post-procedure vital signs reviewed and stable Respiratory status: spontaneous breathing and respiratory function stable Cardiovascular status: blood pressure returned to baseline and stable Postop Assessment: no headache, no backache, no apparent nausea or vomiting and able to ambulate Anesthetic complications: no   No notable events documented.  Last Vitals:  Vitals:   06/26/21 1200 06/26/21 1217  BP: 112/69 92/70  Pulse: 83 76  Resp: 17 18  Temp:  37.1 C  SpO2: 100% 99%    Last Pain:  Vitals:   06/26/21 1217  TempSrc: Oral  PainSc:    Pain Goal:                   Trevor Iha

## 2021-06-26 NOTE — H&P (Addendum)
Angela Henson is a 21 [id number]  female presenting at 71 4/7 for scheduled elective repeat cesarean section. Pt is dated per LMP which was confirmed with a 10week [place].  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 She has a history of c/s x 2. She also had preE in past pregnancies but normal BP this pregnancy. She has a history of PPROM with preterm delivery but declined makena ( did not use in second pregnancy either)  OB History     Gravida  4   Para  2   Term  1   Preterm  1   AB  1   Living  2      SAB  1   IAB  0   Ectopic  0   Multiple  0   Live Births  2          Past Medical History:  Diagnosis Date   Gestational diabetes    Headache(784.0)    History of pre-eclampsia    Hx of chlamydia infection    Hx of varicella    Past Surgical History:  Procedure Laterality Date   CESAREAN SECTION  11/03/2012   Procedure: CESAREAN SECTION;  Surgeon: Bing Plume, MD;  Location: [facility];  Service: Obstetrics;  Laterality: N/A;  Primary cesarean section with delivery of baby boy at 48. Apgars 8/9.   CESAREAN SECTION N/A 03/22/2016   Procedure: REPEAT CESAREAN SECTION;  Surgeon: [name], DO;  Location: [facility];  Service: Obstetrics;  Laterality: N/A;  Heather K to RNFA- confirmed   INCISION AND DRAINAGE ABSCESS ANAL  2013   I&D of right buttock   WISDOM TOOTH EXTRACTION     Family History: family history includes Diabetes in her father. Social History:  reports that she quit smoking about 5 years ago. Her smoking use included cigarettes. She has a 4.00 pack-year smoking history. She has never used smokeless tobacco. She reports that she does not drink alcohol and does not use drugs.     Maternal Diabetes: Yes:  Diabetes Type:  Insulin/Medication controlled Genetic Screening: Declined Maternal Ultrasounds/Referrals: Normal Fetal Ultrasounds  or other Referrals:  None Maternal Substance Abuse:  No Significant Maternal Medications:  None Significant Maternal Lab Results:  Group B Strep negative Other Comments:  None  Review of Systems  Constitutional:  Positive for activity change and fatigue.  Eyes:  Negative for photophobia and visual disturbance.  Respiratory:  Negative for chest tightness and shortness of breath.   Cardiovascular:  Negative for chest pain, palpitations and leg swelling.  Gastrointestinal:  Positive for abdominal pain.  Genitourinary:  Positive for pelvic pain. Negative for vaginal bleeding.  Musculoskeletal:  Positive for back pain, gait problem and myalgias.  Neurological:  Negative for light-headedness and headaches.  Psychiatric/Behavioral:  The patient is nervous/anxious.   All other systems reviewed and are negative. Maternal Medical History:  Reason for admission: Scheduled repeat cesarean section   Contractions: Frequency: rare.   Perceived severity is mild.   Fetal activity: Perceived fetal activity is normal.   Last perceived fetal movement was within the past hour.   Prenatal complications: no prenatal complications Prenatal Complications - Diabetes: gestational. Diabetes is managed by oral agent (monotherapy).      Blood pressure 113/69, height 5' (1.524 m), weight 66.2 kg, last menstrual period 09/29/2020, SpO2 100 %, unknown if currently breastfeeding. Maternal Exam:  Uterine Assessment: Contraction strength is mild.  Contraction frequency is rare.  Abdomen: Patient reports generalized tenderness.  Estimated fetal weight is AGA.   Fetal presentation: vertex Introitus: Normal vulva. Normal vagina.    Fetal Exam Fetal Monitor Review: Baseline rate: 140s.  Variability: moderate (6-25 bpm).   Pattern: accelerations present and no decelerations.   Fetal State Assessment: Category I - tracings are normal.  Physical Exam Vitals and nursing note reviewed. Exam conducted with a chaperone  present.  Constitutional:      Appearance: Normal appearance.  Cardiovascular:     Rate and Rhythm: Normal rate.     Pulses: Normal pulses.  Pulmonary:     Effort: Pulmonary effort is normal.  Abdominal:     Tenderness: There is generalized abdominal tenderness.  Genitourinary:    General: Normal vulva.  Musculoskeletal:        General: Normal range of motion.     Cervical back: Normal range of motion.  Skin:    General: Skin is warm and dry.     Capillary Refill: Capillary refill takes 2 to 3 seconds.  Neurological:     General: No focal deficit present.     Mental Status: She is alert and oriented to person, place, and time. Mental status is at baseline.  Psychiatric:        Mood and Affect: Mood normal.        Behavior: Behavior normal.        Thought Content: Thought content normal.    Prenatal labs: ABO, Rh: --/--/B POS (09/01 1002) Antibody: NEG (09/01 1002) Rubella: Immune (02/16 0000) RPR: NON REACTIVE (09/01 0917)  HBsAg: Negative (02/16 0000)  HIV: Non-reactive (02/16 0000)  GBS:     Assessment/Plan: 37TK [id number] female here for repeat cesarean section at 38 4/7wks - Admit  - ERAS protocol - Verify consent  - GBS negative - History of Hepatitis C - GDM- uncontrolled on metformin -To OR when ready    Damian Hofstra W Elisah Parmer 06/26/2021, 8:50 AM

## 2021-06-27 LAB — CBC
HCT: 25.3 % — ABNORMAL LOW (ref 36.0–46.0)
Hemoglobin: 8.4 g/dL — ABNORMAL LOW (ref 12.0–15.0)
MCH: 29.8 pg (ref 26.0–34.0)
MCHC: 33.2 g/dL (ref 30.0–36.0)
MCV: 89.7 fL (ref 80.0–100.0)
Platelets: 246 10*3/uL (ref 150–400)
RBC: 2.82 MIL/uL — ABNORMAL LOW (ref 3.87–5.11)
RDW: 15.3 % (ref 11.5–15.5)
WBC: 14.9 10*3/uL — ABNORMAL HIGH (ref 4.0–10.5)
nRBC: 0 % (ref 0.0–0.2)

## 2021-06-27 LAB — GLUCOSE, CAPILLARY: Glucose-Capillary: 80 mg/dL (ref 70–99)

## 2021-06-27 NOTE — Progress Notes (Signed)
Subjective: Postpartum Day 1: Cesarean Delivery Patient reports incisional pain, tolerating PO, + flatus, and no problems voiding.  She denies HA, CP or SOB. Ambulating with no issues. Bonding well with baby. Desires circumcision. Has no complaints   Objective: Vital signs in last 24 hours: Temp:  [97.6 F (36.4 C)-98.8 F (37.1 C)] 98.1 F (36.7 C) (09/04 5621) Pulse Rate:  [74-83] 80 (09/04 0614) Resp:  [18-20] 18 (09/04 3086) BP: (92-115)/(52-77) 101/52 (09/04 0614) SpO2:  [99 %-100 %] 99 % (09/04 0207)  Physical Exam:  General: alert, cooperative, and no distress Lochia: appropriate Uterine Fundus: firm Incision: no significant drainage DVT Evaluation: No evidence of DVT seen on physical exam.  Recent Labs    06/27/21 0456  HGB 8.4*  HCT 25.3*    Assessment/Plan: Status post Cesarean section. Doing well postoperatively.  Continue current care Discussed circumcision .  Cathrine Muster 06/27/2021, 12:09 PM

## 2021-06-28 MED ORDER — ACETAMINOPHEN 500 MG PO TABS: 1000.0000 mg | ORAL_TABLET | Freq: Four times a day (QID) | ORAL | 0 refills | Status: DC

## 2021-06-28 MED ORDER — OXYCODONE HCL 5 MG PO TABS: 5.0000 mg | ORAL_TABLET | ORAL | 0 refills | Status: DC | PRN

## 2021-06-28 MED ORDER — IBUPROFEN 600 MG PO TABS: 600.0000 mg | ORAL_TABLET | Freq: Four times a day (QID) | ORAL | 0 refills | Status: DC

## 2021-06-28 NOTE — Clinical Social Work Maternal (Signed)
CLINICAL SOCIAL WORK MATERNAL/CHILD NOTE  Patient Details  Name: Angela Henson MRN: [id number] Date of Birth: 12/20/1990  Date:  06/28/2021  Clinical Social Worker Initiating Note:  Glorianna Gott, LCSWA Date/Time: Initiated:  06/28/21/0930     Child's Name:  Tiawan Glover   Biological Parents:  Mother, Father (Antonio Glover 06/11/1983)   Need for Interpreter:  None   Reason for Referral:  Behavioral Health Concerns, Current Substance Use/Substance Use During Pregnancy     Address:  [address] [place] [address]    Phone number:  [phone] (home)     Additional phone number:   Household Members/Support Persons (HM/SP):   Household Member/Support Person 1, Household Member/Support Person 2, Household Member/Support Person 3, Household Member/Support Person 4   HM/SP Name Relationship DOB or Age  HM/SP -1 Carol Soles Mother 12/17/1968  HM/SP -2 Tomorrea Moore Son 11/03/2012  HM/SP -3 [name] Blades Son 03/22/2016  HM/SP -4        HM/SP -5        HM/SP -6        HM/SP -7        HM/SP -8          Natural Supports (not living in the home):  Immediate Family   Professional Supports: None   Employment: Part-time   Type of Work: [employer]   Education:  High school graduate   Homebound arranged:    Financial Resources:  Medicaid   Other Resources:  Food Stamps     Cultural/Religious Considerations Which May Impact Care:    Strengths:  Ability to meet basic needs  , Pediatrician chosen, Home prepared for child     Psychotropic Medications:         Pediatrician:    [place] area  Pediatrician List:   [facility]  [place]    [place]    [place]    [place]    [place]      Pediatrician Fax Number:    Risk Factors/Current Problems:  Substance Use     Cognitive State:  Alert  , Insightful  , Linear Thinking  , Goal Oriented     Mood/Affect:  Happy  , Interested  , Bright      CSW Assessment: CSW consulted for "anxiety. At risk for postpartum depression. Refusing meds/counseling & THC. Issues with dad 1st night on mother baby, crying because he left."  CSW met with [name] to assess and offer support. CSW introduced self and role. CSW observed infant in bassinet and FOB bedside. [name] was welcoming, stating she was expecting visit from CSW. CSW requested to speak with [name] alone, however [name] declined and stated FOB could remain in room. CSW informed [name] of reason for consult and assessed current emotions. [name] reported she is currently doing fine and had a pretty good pregnancy. CSW discussed [name] mental health history. [name] reported she was diagnosed with anxiety years ago. [name] denies any anxiety symptoms now or during pregnancy. [name] reported she has never been on medication or attended therapy. CSW asked [name] if she would be open to medication management or therapy, if needs arise. [name] stated "yes, but I don't need anything now." CSW asked [name] if she has any coping skills for symptoms. [name] stated when she experienced PPD in 2017, talking to her doctor and mother was helpful. [name] identified her mother as a primary support and denies any SI or HI. CSW later spoke with [name]   via phone to inquire on any DV concerns. [name] stated there is no current DV and she and FOB are doing well.   CSW inquired on substance use during pregnancy. [name] disclosed she smoked THC during pregnancy to help with her appetite. [name] stated "I was tapping it occasionally." [name] reported she last smoked THC mid July. [name] denies any additional substance use. CSW informed [name] of the hospital drug screen policy. [name] aware infant UDS is positive and the CDS will be followed. CSW informed [name] that a CPS reported will be made. [name] was understanding and stated she had CPS cases for the same reason with her previous children. [name] denies any questions regarding the policy.  CSW provided education regarding the baby blues period  versus PPD and provided resources. CSW provided the New Mom Checklist and encouraged [name] to self evaluate and contact a medical professional if symptoms are noted at any time.   CSW provided review of Sudden Infant Death Syndrome (SIDS) precautions. [name] stated she has a packn'play and all infant essentials. [name] denies any transportation barriers to care. [name] denied having any additional needs at this time.  CSW filed CPS report with [place] CPS. CDS will be followed and an additional CPS report will be made if warranted. CSW identifies no further need for intervention and no barriers to discharge at this time.  CSW Plan/Description:  No Further Intervention Required/No Barriers to Discharge, CSW Will Continue to Monitor Umbilical Cord Tissue Drug Screen Results and Make Report if Warranted, Child Protective Service Report  , Hospital Drug Screen Policy Information, Perinatal Mood and Anxiety Disorder (PMADs) Education, Sudden Infant Death Syndrome (SIDS) Education, Other Information/Referral to Community Resources    Gage Weant J Evelio Rueda, LCSWA 06/28/2021, 10:01 AM 

## 2021-06-28 NOTE — Discharge Summary (Signed)
Postpartum Discharge Summary       Patient Name: Angela Henson DOB: March 25, 1991 MRN: [id number]  Date of admission: 06/26/2021 Delivery date:06/26/2021  Delivering provider: [facility]  Date of discharge: 06/28/2021  Admitting diagnosis: Previous cesarean delivery affecting pregnancy [O34.219] Intrauterine pregnancy: [redacted]w[redacted]d     Secondary diagnosis:  Active Problems:   Previous cesarean delivery affecting pregnancy   Status post repeat low transverse cesarean section  Additional problems: gestational diabetes                                       Viral hepatitis C   Discharge diagnosis: Term Pregnancy Delivered and GDM A1                                              Post partum procedures: none Augmentation: N/A Complications: None  Hospital course: Sceduled C/S   30 y.o. yo [id number] at [redacted]w[redacted]d was admitted to the hospital 06/26/2021 for scheduled cesarean section with the following indication:Elective Repeat.Delivery details are as follows:  Membrane Rupture Time/Date: 9:52 AM ,06/26/2021   Delivery Method:C-Section, Low Transverse  Details of operation can be found in separate operative note.  Patient had an uncomplicated postpartum course.  She is ambulating, tolerating a regular diet, passing flatus, and urinating well. Patient is discharged home in stable condition on  06/28/21        Newborn Data: Birth date:06/26/2021  Birth time:9:52 AM  Gender:Female  Living status:Living  Apgars:9 ,9  Weight:2445 g       Physical exam  Vitals:   06/27/21 0614 06/27/21 1339 06/27/21 1930 06/28/21 0523  BP: (!) 101/52 98/66 104/61 (!) 110/56  Pulse: 80 95 88 100  Resp: 18 16 16 16   Temp: 98.1 F (36.7 C) 98.2 F (36.8 C) 98 F (36.7 C) 98.2 F (36.8 C)  TempSrc: Oral Oral Oral Oral  SpO2:  99% 100%   Weight:      Height:       General: alert and cooperative Lochia: appropriate Uterine Fundus: firm Incision: Dressing is clean, dry, and intact DVT Evaluation: No evidence  of DVT seen on physical exam. Labs: Lab Results  Component Value Date   WBC 14.9 (H) 06/27/2021   HGB 8.4 (L) 06/27/2021   HCT 25.3 (L) 06/27/2021   MCV 89.7 06/27/2021   PLT 246 06/27/2021   CMP Latest Ref Rng & Units 10/26/2020  Glucose 70 - 99 mg/dL 94  BUN 6 - 20 mg/dL 15  Creatinine 12/24/2020 - 5.36 mg/dL 1.44  Sodium 3.15 - 400 mmol/L 135  Potassium 3.5 - 5.1 mmol/L 4.0  Chloride 98 - 111 mmol/L 102  CO2 22 - 32 mmol/L 25  Calcium 8.9 - 10.3 mg/dL 9.5  Total Protein 6.5 - 8.1 g/dL 8.0  Total Bilirubin 0.3 - 1.2 mg/dL 0.7  Alkaline Phos 38 - 126 U/L 62  AST 15 - 41 U/L 34  ALT 0 - 44 U/L 40   Edinburgh Score: Edinburgh Postnatal Depression Scale Screening Tool 06/26/2021  I have been able to laugh and see the funny side of things. 3  I have looked forward with enjoyment to things. 3  I have blamed myself unnecessarily when things went wrong. 1  I have been anxious or worried  for no good reason. 0  I have felt scared or panicky for no good reason. 0  Things have been getting on top of me. 0  I have been so unhappy that I have had difficulty sleeping. 0  I have felt sad or miserable. 0  I have been so unhappy that I have been crying. 0  The thought of harming myself has occurred to me. 0  Edinburgh Postnatal Depression Scale Total 7     After visit meds:  Allergies as of 06/28/2021   No Known Allergies      Medication List     STOP taking these medications    metFORMIN 500 MG 24 hr tablet Commonly known as: GLUCOPHAGE-XR       TAKE these medications    acetaminophen 500 MG tablet Commonly known as: TYLENOL Take 2 tablets (1,000 mg total) by mouth every 6 (six) hours. What changed:  how much to take when to take this reasons to take this   ibuprofen 600 MG tablet Commonly known as: ADVIL Take 1 tablet (600 mg total) by mouth every 6 (six) hours.   oxyCODONE 5 MG immediate release tablet Commonly known as: Oxy IR/ROXICODONE Take 1-2 tablets (5-10 mg  total) by mouth every 4 (four) hours as needed for moderate pain.   PrePLUS 27-1 MG Tabs Take 1 tablet by mouth daily.         Discharge home in stable condition Infant Feeding: Bottle Infant Disposition:home with mother Discharge instruction: per After Visit Summary and Postpartum booklet. Activity: Advance as tolerated. Pelvic rest for 6 weeks.  Diet: routine diet Future Appointments:No future appointments. Follow up Visit:  Follow-up Information     Huel Cote, MD. Schedule an appointment as soon as possible for a visit in 2 week(s).   Specialty: Obstetrics and Gynecology Why: Incision check Contact information: [address] [place] [address] [phone]                  Please schedule this patient for a In person postpartum visit in  2 weeks  with the following provider: MD.   Current pregnancy complicated by: GDM--needs 2 hour GCT Delivery mode:  C-Section, Low Transverse  Anticipated Birth Control:  Unsure   06/28/2021 Oliver Pila, MD

## 2021-06-28 NOTE — Progress Notes (Signed)
Subjective: Postpartum Day 2: Cesarean Delivery Patient reports incisional pain, tolerating PO, + flatus, and no problems voiding.  Ambulating well.  Objective: Vital signs in last 24 hours: Temp:  [98 F (36.7 C)-98.2 F (36.8 C)] 98.2 F (36.8 C) (09/05 0523) Pulse Rate:  [88-100] 100 (09/05 0523) Resp:  [16] 16 (09/05 0523) BP: (98-110)/(56-66) 110/56 (09/05 0523) SpO2:  [99 %-100 %] 100 % (09/04 1930)  Physical Exam:  General: alert and cooperative Lochia: appropriate  Incision: Dressing C/D/I DVT Evaluation: No evidence of DVT seen on physical exam.  Recent Labs    06/27/21 0456  HGB 8.4*  HCT 25.3*    Assessment/Plan: Status post Cesarean section. Doing well postoperatively.  Discharge home with standard precautions and return to clinic in 2 weeks for incision check. Postpartum instructions reviewed and incision care.  Advised to try MOM if no BM prn.  Angela Henson 06/28/2021, 9:21 AM

## 2021-07-10 ENCOUNTER — Telehealth (HOSPITAL_COMMUNITY): Payer: Self-pay

## 2021-07-10 NOTE — Telephone Encounter (Signed)
No answer. Left message to return nurse call.  Marcelino Duster [facility] 07/10/2021,1210

## 2021-11-15 ENCOUNTER — Encounter (HOSPITAL_BASED_OUTPATIENT_CLINIC_OR_DEPARTMENT_OTHER): Payer: Self-pay

## 2021-11-15 ENCOUNTER — Other Ambulatory Visit: Payer: Self-pay

## 2021-11-15 ENCOUNTER — Emergency Department (HOSPITAL_BASED_OUTPATIENT_CLINIC_OR_DEPARTMENT_OTHER)
Admission: EM | Admit: 2021-11-15 | Discharge: 2021-11-15 | Disposition: A | Discharge status: Home / Self Care | Payer: Medicaid Other | Attending: Emergency Medicine | Admitting: Emergency Medicine

## 2021-11-15 DIAGNOSIS — J020 Streptococcal pharyngitis: Secondary | ICD-10-CM | POA: Diagnosis not present

## 2021-11-15 DIAGNOSIS — J029 Acute pharyngitis, unspecified: Secondary | ICD-10-CM | POA: Diagnosis present

## 2021-11-15 LAB — GROUP A STREP BY PCR: Group A Strep by PCR: DETECTED — AB

## 2021-11-15 MED ORDER — LIDOCAINE VISCOUS HCL 2 % MT SOLN
15.0000 mL | Freq: Once | OROMUCOSAL | Status: AC
  Administered 2021-11-15: 15 mL via OROMUCOSAL
  Filled 2021-11-15: qty 15

## 2021-11-15 MED ORDER — PENICILLIN G BENZATHINE 1200000 UNIT/2ML IM SUSY
1.2000 10*6.[IU] | PREFILLED_SYRINGE | Freq: Once | INTRAMUSCULAR | Status: DC
  Filled 2021-11-15: qty 2

## 2021-11-15 MED ORDER — AMOXICILLIN 500 MG PO CAPS: 500.0000 mg | ORAL_CAPSULE | Freq: Two times a day (BID) | ORAL | 0 refills | Status: AC

## 2021-11-15 MED ORDER — DEXAMETHASONE SODIUM PHOSPHATE 10 MG/ML IJ SOLN
10.0000 mg | Freq: Once | INTRAMUSCULAR | Status: AC
  Administered 2021-11-15: 10 mg via INTRAMUSCULAR
  Filled 2021-11-15: qty 1

## 2021-11-15 MED ORDER — LIDOCAINE VISCOUS HCL 2 % MT SOLN: 15.0000 mL | Freq: Four times a day (QID) | OROMUCOSAL | 0 refills | Status: DC | PRN

## 2021-11-15 NOTE — ED Provider Notes (Signed)
[facility] EMERGENCY DEPT Provider Note   CSN: [id number] Arrival date & time: 11/15/21  1335     History  Chief Complaint  Patient presents with   Sore Throat    Angela Henson is a 31 y.o. female who presents to the ED due to sore throat x3 days.  Patient states pain is worse when swallowing.  Denies difficulty swallowing, changes to phonation, shortness of breath, and trismus.  No fever or chills.  Denies sick contacts.       Home Medications Prior to Admission medications   Medication Sig Start Date End Date Taking? Authorizing Provider  acetaminophen (TYLENOL) 500 MG tablet Take 2 tablets (1,000 mg total) by mouth every 6 (six) hours. 06/28/21   Paula Compton, MD  ibuprofen (ADVIL) 600 MG tablet Take 1 tablet (600 mg total) by mouth every 6 (six) hours. 06/28/21   Paula Compton, MD  oxyCODONE (OXY IR/ROXICODONE) 5 MG immediate release tablet Take 1-2 tablets (5-10 mg total) by mouth every 4 (four) hours as needed for moderate pain. 06/28/21   Paula Compton, MD  Prenatal Vit-Fe Fumarate-FA (PREPLUS) 27-1 MG TABS Take 1 tablet by mouth daily. 09/29/20   Gavin Pound, CNM      Allergies    Patient has no known allergies.    Review of Systems   Review of Systems  Constitutional:  Negative for chills and fever.  HENT:  Positive for sore throat. Negative for trouble swallowing and voice change.   Respiratory:  Negative for shortness of breath.    Physical Exam Updated Vital Signs BP 118/68 (BP Location: Right Arm)    Pulse (!) 107    Temp 99.1 F (37.3 C)    Resp 16    Ht 4\' 11"  (1.499 m)    Wt 61.2 kg    SpO2 100%    BMI 27.27 kg/m  Physical Exam Vitals and nursing note reviewed.  Constitutional:      General: She is not in acute distress.    Appearance: She is not ill-appearing.  HENT:     Head: Normocephalic.     Mouth/Throat:     Comments: Posterior oropharynx clear and mucous membranes moist, there is mild erythema but no tonsillar exudates,  uvula midline, normal phonation, no trismus, tolerating secretions without difficulty. Mild tonsillar hypertrophy. Eyes:     Pupils: Pupils are equal, round, and reactive to light.  Cardiovascular:     Rate and Rhythm: Normal rate and regular rhythm.     Pulses: Normal pulses.     Heart sounds: Normal heart sounds. No murmur heard.   No friction rub. No gallop.  Pulmonary:     Effort: Pulmonary effort is normal.     Breath sounds: Normal breath sounds.  Abdominal:     General: Abdomen is flat. There is no distension.     Palpations: Abdomen is soft.     Tenderness: There is no abdominal tenderness. There is no guarding or rebound.  Musculoskeletal:        General: Normal range of motion.     Cervical back: Neck supple.  Skin:    General: Skin is warm and dry.  Neurological:     General: No focal deficit present.     Mental Status: She is alert.  Psychiatric:        Mood and Affect: Mood normal.        Behavior: Behavior normal.    ED Results / Procedures / Treatments   Labs (all  labs ordered are listed, but only abnormal results are displayed) Labs Reviewed  GROUP A STREP BY PCR - Abnormal; Notable for the following components:      Result Value   Group A Strep by PCR DETECTED (*)    All other components within normal limits    EKG None  Radiology No results found.  Procedures Procedures    Medications Ordered in ED Medications  dexamethasone (DECADRON) injection 10 mg (has no administration in time range)  penicillin g benzathine (BICILLIN LA) 1200000 UNIT/2ML injection 1.2 Million Units (has no administration in time range)    ED Course/ Medical Decision Making/ A&P Clinical Course as of 11/15/21 1630  Mon Nov 15, 2021  1614 Group A Strep by PCR(!): DETECTED [CA]    Clinical Course User Index [CA] Suzy Bouchard, PA-C                           Medical Decision Making Amount and/or Complexity of Data Reviewed Labs: ordered. Decision-making  details documented in ED Course.  Risk Prescription drug management.   31 year old female presents to the ED due to sore throat x3 days.  Denies difficulties swallowing, changes to phonation, trismus, fever, and chills.  Upon arrival, stable vitals.  Patient in no acute distress.  Patient tolerating oral secretions without difficulty.  Mild tonsillar hypertrophy.  No exudates.  Uvula midline.  No abscess.  Strep positive.  Patient given IM Decadron and penicillin.  Patient also given viscous lidocaine and discharged with same.  No meningismus to suggest meningitis.  Advised patient follow-up with PCP symptoms not improve over the next week. Strict ED precautions discussed with patient. Patient states understanding and agrees to plan. Patient discharged home in no acute distress and stable vitals        Final Clinical Impression(s) / ED Diagnoses Final diagnoses:  None    Rx / DC Orders ED Discharge Orders     None         Karie Kirks 11/15/21 1638    Lacretia Leigh, MD 11/16/21 1721

## 2021-11-15 NOTE — Discharge Instructions (Addendum)
It was a pleasure taking care of you today. As discussed, your strep test was positive. You were treated for strep here in the ER. Follow-up with PCP if symptoms do not improve over the next week. Return to the ER for new or worsening symptoms.

## 2021-11-15 NOTE — ED Notes (Signed)
EMT-P provided AVS using Teachback Method. Patient verbalizes understanding of Discharge Instructions. Opportunity for Questioning and Answers were provided by EMT-P. Patient Discharged from ED.  ? ?

## 2021-11-15 NOTE — ED Triage Notes (Signed)
Pt c/o sore throat x 3 days. Wants to make sure she doesn't have strep. Reports negative covid test at home.

## 2022-10-15 ENCOUNTER — Emergency Department (HOSPITAL_BASED_OUTPATIENT_CLINIC_OR_DEPARTMENT_OTHER)
Admission: EM | Admit: 2022-10-15 | Discharge: 2022-10-15 | Disposition: A | Discharge status: Home / Self Care | Payer: Medicaid Other | Attending: Emergency Medicine | Admitting: Emergency Medicine

## 2022-10-15 ENCOUNTER — Emergency Department (HOSPITAL_BASED_OUTPATIENT_CLINIC_OR_DEPARTMENT_OTHER): Payer: Medicaid Other | Admitting: Radiology

## 2022-10-15 ENCOUNTER — Encounter (HOSPITAL_BASED_OUTPATIENT_CLINIC_OR_DEPARTMENT_OTHER): Payer: Self-pay

## 2022-10-15 DIAGNOSIS — S6000XA Contusion of unspecified finger without damage to nail, initial encounter: Secondary | ICD-10-CM

## 2022-10-15 DIAGNOSIS — S60221A Contusion of right hand, initial encounter: Secondary | ICD-10-CM | POA: Diagnosis not present

## 2022-10-15 DIAGNOSIS — W2209XA Striking against other stationary object, initial encounter: Secondary | ICD-10-CM | POA: Diagnosis not present

## 2022-10-15 DIAGNOSIS — S6991XA Unspecified injury of right wrist, hand and finger(s), initial encounter: Secondary | ICD-10-CM | POA: Diagnosis present

## 2022-10-15 MED ORDER — NAPROXEN 500 MG PO TABS: 500.0000 mg | ORAL_TABLET | Freq: Two times a day (BID) | ORAL | 0 refills | Status: DC

## 2022-10-15 NOTE — ED Triage Notes (Signed)
Pt states she punched a wall day before yesterday, endorses continued pain/ swelling. Limited ROM, CNS intact distal to injury.

## 2022-10-15 NOTE — ED Provider Notes (Signed)
[facility] EMERGENCY DEPT Provider Note   CSN: [id number] Arrival date & time: 10/15/22  1232     History  Chief Complaint  Patient presents with   Hand Pain    Angela Henson is a 31 y.o. female.  Patient is a 31 year old female presenting today with pain in her right hand for the last 2 days after she punched a wooden wall 2 days ago.  She is having swelling and increased pain in the hand over the second through fifth fingers over the knuckles.  It is painful for her to use her fingers or to try to wrap presents.  She has no wrist pain.  She is right-handed.  The history is provided by the patient.  Hand Pain       Home Medications Prior to Admission medications   Medication Sig Start Date End Date Taking? Authorizing Provider  naproxen (NAPROSYN) 500 MG tablet Take 1 tablet (500 mg total) by mouth 2 (two) times daily. 10/15/22  Yes Gwyneth Sprout, MD  acetaminophen (TYLENOL) 500 MG tablet Take 2 tablets (1,000 mg total) by mouth every 6 (six) hours. 06/28/21   Huel Cote, MD  ibuprofen (ADVIL) 600 MG tablet Take 1 tablet (600 mg total) by mouth every 6 (six) hours. 06/28/21   Huel Cote, MD  lidocaine (XYLOCAINE) 2 % solution Use as directed 15 mLs in the mouth or throat every 6 (six) hours as needed for mouth pain. 11/15/21   Mannie Stabile, PA-C  oxyCODONE (OXY IR/ROXICODONE) 5 MG immediate release tablet Take 1-2 tablets (5-10 mg total) by mouth every 4 (four) hours as needed for moderate pain. 06/28/21   Huel Cote, MD  Prenatal Vit-Fe Fumarate-FA (PREPLUS) 27-1 MG TABS Take 1 tablet by mouth daily. 09/29/20   Gerrit Heck, CNM      Allergies    Patient has no known allergies.    Review of Systems   Review of Systems  Physical Exam Updated Vital Signs BP 108/69 (BP Location: Left Arm)   Pulse 91   Temp 97.9 F (36.6 C) (Oral)   Resp 14   Ht 5' (1.524 m)   Wt 63.5 kg   SpO2 100%   BMI 27.34 kg/m  Physical Exam Vitals  and nursing note reviewed.  HENT:     Head: Normocephalic.  Pulmonary:     Effort: Pulmonary effort is normal.  Musculoskeletal:        General: Swelling and tenderness present.     Right wrist: Normal.       Hands:  Neurological:     Mental Status: She is alert. Mental status is at baseline.  Psychiatric:        Mood and Affect: Mood normal.     ED Results / Procedures / Treatments   Labs (all labs ordered are listed, but only abnormal results are displayed) Labs Reviewed - No data to display  EKG None  Radiology DG Hand Complete Right  Result Date: 10/15/2022 CLINICAL DATA:  Patient punched a wooden wall 2 days ago with increasing pain and swelling in the ring and little fingers as well as metacarpals of the right hand. Patient is unable to fully straighten little finger. EXAM: RIGHT HAND - COMPLETE 3+ VIEW COMPARISON:  None Available. FINDINGS: There is no evidence of fracture or dislocation. There is no evidence of arthropathy or other focal bone abnormality. Soft tissues are unremarkable. IMPRESSION: Negative. Electronically Signed   By: Sherron Ales M.D.   On: 10/15/2022 13:26  Procedures Procedures    Medications Ordered in ED Medications - No data to display  ED Course/ Medical Decision Making/ A&P                           Medical Decision Making Amount and/or Complexity of Data Reviewed Radiology: ordered.  Patient presenting today with persistent hand pain and swelling after punching a wall.  Concern for fracture versus contusion. I have independently visualized and interpreted pt's images today.  X-ray negative for fracture today.  Patient treated for contusion.  Placed in a Velcro splint.  Encouraged icing and given anti-inflammatories.         Final Clinical Impression(s) / ED Diagnoses Final diagnoses:  Contusion of multiple sites of right hand and fingers, initial encounter    Rx / DC Orders ED Discharge Orders          Ordered     naproxen (NAPROSYN) 500 MG tablet  2 times daily        10/15/22 1749              Gwyneth Sprout, MD 10/15/22 1749

## 2022-10-15 NOTE — Discharge Instructions (Addendum)
No broken bones on x-ray today.  It is just very badly bruised.  Ice it, elevate it and take anti-inflammatory medication.

## 2023-11-10 ENCOUNTER — Telehealth: Payer: Medicaid Other | Admitting: Nurse Practitioner

## 2023-11-10 DIAGNOSIS — R6889 Other general symptoms and signs: Secondary | ICD-10-CM | POA: Diagnosis not present

## 2023-11-10 MED ORDER — ONDANSETRON 4 MG PO TBDP: 4.0000 mg | ORAL_TABLET | Freq: Three times a day (TID) | ORAL | 0 refills | Status: DC | PRN

## 2023-11-10 MED ORDER — BENZONATATE 100 MG PO CAPS: 100.0000 mg | ORAL_CAPSULE | Freq: Three times a day (TID) | ORAL | 0 refills | Status: DC | PRN

## 2023-11-10 MED ORDER — OSELTAMIVIR PHOSPHATE 75 MG PO CAPS: 75.0000 mg | ORAL_CAPSULE | Freq: Two times a day (BID) | ORAL | 0 refills | Status: AC

## 2023-11-10 NOTE — Progress Notes (Signed)
Virtual Visit Consent   Angela Henson, you are scheduled for a virtual visit with a  provider today. Just as with appointments in the office, your consent must be obtained to participate. Your consent will be active for this visit and any virtual visit you may have with one of our providers in the next 365 days. If you have a MyChart account, a copy of this consent can be sent to you electronically.  As this is a virtual visit, video technology does not allow for your provider to perform a traditional examination. This may limit your provider's ability to fully assess your condition. If your provider identifies any concerns that need to be evaluated in person or the need to arrange testing (such as labs, EKG, etc.), we will make arrangements to do so. Although advances in technology are sophisticated, we cannot ensure that it will always work on either your end or our end. If the connection with a video visit is poor, the visit may have to be switched to a telephone visit. With either a video or telephone visit, we are not always able to ensure that we have a secure connection.  By engaging in this virtual visit, you consent to the provision of healthcare and authorize for your insurance to be billed (if applicable) for the services provided during this visit. Depending on your insurance coverage, you may receive a charge related to this service.  I need to obtain your verbal consent now. Are you willing to proceed with your visit today? Angela Henson has provided verbal consent on 11/10/2023 for a virtual visit (video or telephone). Viviano Simas, FNP  Date: 11/10/2023 5:01 PM  Virtual Visit via Video Note   I, Viviano Simas, connected with  Angela Henson  ([id number], 12-11-1990) on 11/10/23 at  5:00 PM EST by a video-enabled telemedicine application and verified that I am speaking with the correct person using two identifiers.  Location: Patient: Virtual Visit Location Patient:  Home Provider: Virtual Visit Location Provider: Home Office   I discussed the limitations of evaluation and management by telemedicine and the availability of in person appointments. The patient expressed understanding and agreed to proceed.    History of Present Illness: Angela Henson is a 33 y.o. who identifies as a female who was assigned female at birth, and is being seen today with symptoms of cold chills, cough and chest congestion.   Loss of appetite with nausea  She did vomit three times symptoms yesterday  Has been pushing fluids   She has had symptoms for about 48 hours now   Denies chance for pregnancy or breastfeeding   Problems:  Patient Active Problem List   Diagnosis Date Noted   Previous cesarean delivery affecting pregnancy 06/26/2021   Status post repeat low transverse cesarean section 06/26/2021   Term pregnancy 03/22/2016   History of cesarean delivery 03/22/2016   Postpartum care following cesarean delivery 03/22/2016   MDD (major depressive disorder), single episode, moderate (HCC) 10/09/2014   Alcohol use disorder, mild, abuse 10/09/2014   Other substance dependence 10/09/2014   Xanax use disorder, 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/2014   Cannabis use disorder, moderate, dependence (HCC) 10/09/2014   Substance induced mood disorder (HCC) 10/08/2014   Polysubstance abuse (HCC) 03/05/2014   substance induced mood disorder 03/05/2014   Depressive disorder 03/05/2014   Preterm premature rupture of membranes (PPROM) delivered, current hospitalization 11/01/2012    Allergies: No Known Allergies Medications:  Current Outpatient Medications:    acetaminophen (  TYLENOL) 500 MG tablet, Take 2 tablets (1,000 mg total) by mouth every 6 (six) hours., Disp: 30 tablet, Rfl: 0   ibuprofen (ADVIL) 600 MG tablet, Take 1 tablet (600 mg total) by mouth every 6 (six) hours., Disp: 30 tablet, Rfl: 0   lidocaine (XYLOCAINE) 2 % solution, Use as directed 15 mLs in the mouth or throat every 6  (six) hours as needed for mouth pain., Disp: 100 mL, Rfl: 0   naproxen (NAPROSYN) 500 MG tablet, Take 1 tablet (500 mg total) by mouth 2 (two) times daily., Disp: 14 tablet, Rfl: 0   oxyCODONE (OXY IR/ROXICODONE) 5 MG immediate release tablet, Take 1-2 tablets (5-10 mg total) by mouth every 4 (four) hours as needed for moderate pain., Disp: 30 tablet, Rfl: 0   Prenatal Vit-Fe Fumarate-FA (PREPLUS) 27-1 MG TABS, Take 1 tablet by mouth daily., Disp: 30 tablet, Rfl: 13  Observations/Objective: Patient is well-developed, well-nourished in no acute distress.  Resting comfortably  at home.  Head is normocephalic, atraumatic.  No labored breathing.  Speech is clear and coherent with logical content.  Patient is alert and oriented at baseline.    Assessment and Plan:   1. Flu-like symptoms (Primary)  - oseltamivir (TAMIFLU) 75 MG capsule; Take 1 capsule (75 mg total) by mouth 2 (two) times daily for 5 days.  Dispense: 10 capsule; Refill: 0 - benzonatate (TESSALON) 100 MG capsule; Take 1 capsule (100 mg total) by mouth 3 (three) times daily as needed.  Dispense: 30 capsule; Refill: 0 - ondansetron (ZOFRAN-ODT) 4 MG disintegrating tablet; Take 1 tablet (4 mg total) by mouth every 8 (eight) hours as needed.  Dispense: 20 tablet; Refill: 0     Follow Up Instructions: I discussed the assessment and treatment plan with the patient. The patient was provided an opportunity to ask questions and all were answered. The patient agreed with the plan and demonstrated an understanding of the instructions.  A copy of instructions were sent to the patient via MyChart unless otherwise noted below.    The patient was advised to call back or seek an in-person evaluation if the symptoms worsen or if the condition fails to improve as anticipated.    Viviano Simas, FNP

## 2023-11-19 ENCOUNTER — Telehealth: Payer: Medicaid Other | Admitting: Family

## 2023-11-19 DIAGNOSIS — B9689 Other specified bacterial agents as the cause of diseases classified elsewhere: Secondary | ICD-10-CM | POA: Diagnosis not present

## 2023-11-19 DIAGNOSIS — J208 Acute bronchitis due to other specified organisms: Secondary | ICD-10-CM | POA: Diagnosis not present

## 2023-11-19 MED ORDER — BENZONATATE 100 MG PO CAPS: 100.0000 mg | ORAL_CAPSULE | Freq: Three times a day (TID) | ORAL | 0 refills | Status: DC | PRN

## 2023-11-19 MED ORDER — DOXYCYCLINE HYCLATE 100 MG PO TABS: 100.0000 mg | ORAL_TABLET | Freq: Two times a day (BID) | ORAL | 0 refills | Status: DC

## 2023-11-19 MED ORDER — PREDNISONE 10 MG (21) PO TBPK: ORAL_TABLET | ORAL | 0 refills | Status: DC

## 2023-11-19 NOTE — Progress Notes (Signed)
Virtual Visit Consent   Angela Henson, you are scheduled for a virtual visit with a [facility] provider today. Just as with appointments in the office, your consent must be obtained to participate. Your consent will be active for this visit and any virtual visit you may have with one of our providers in the next 365 days. If you have a MyChart account, a copy of this consent can be sent to you electronically.  As this is a virtual visit, video technology does not allow for your provider to perform a traditional examination. This may limit your provider's ability to fully assess your condition. If your provider identifies any concerns that need to be evaluated in person or the need to arrange testing (such as labs, EKG, etc.), we will make arrangements to do so. Although advances in technology are sophisticated, we cannot ensure that it will always work on either your end or our end. If the connection with a video visit is poor, the visit may have to be switched to a telephone visit. With either a video or telephone visit, we are not always able to ensure that we have a secure connection.  By engaging in this virtual visit, you consent to the provision of healthcare and authorize for your insurance to be billed (if applicable) for the services provided during this visit. Depending on your insurance coverage, you may receive a charge related to this service.  I need to obtain your verbal consent now. Are you willing to proceed with your visit today? Angela Henson has provided verbal consent on 11/19/2023 for a virtual visit (video or telephone). Jannifer Rodney, FNP  Date: 11/19/2023 11:31 AM  Virtual Visit via Video Note   I, Jannifer Rodney, connected with  Angela Henson  ([id number], 11-18-1990) on 11/19/23 at 11:30 AM EST by a video-enabled telemedicine application and verified that I am speaking with the correct person using two identifiers.  Location: Patient: Virtual Visit Location Patient:  Home Provider: Virtual Visit Location Provider: Home Office   I discussed the limitations of evaluation and management by telemedicine and the availability of in person appointments. The patient expressed understanding and agreed to proceed.    History of Present Illness: Angela Henson is a 33 y.o. who identifies as a female who was assigned female at birth, and is being seen today for chest pain that started 3 days ago. She was diagnosed with Flu on 11/10/23 and completed Tamiflu. She has been coughing since.   HPI: Chest Pain  This is a new problem. The current episode started in the past 7 days. The onset quality is sudden. Associated symptoms include a cough.  Cough This is a new problem. The current episode started 1 to 4 weeks ago. The problem has been unchanged. Associated symptoms include chest pain.    Problems:  Patient Active Problem List   Diagnosis Date Noted   Previous cesarean delivery affecting pregnancy 06/26/2021   Status post repeat low transverse cesarean section 06/26/2021   Term pregnancy 03/22/2016   History of cesarean delivery 03/22/2016   Postpartum care following cesarean delivery 03/22/2016   MDD (major depressive disorder), single episode, moderate (HCC) 10/09/2014   Alcohol use disorder, mild, abuse 10/09/2014   Other substance dependence 10/09/2014   Xanax use disorder, 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/2014   Cannabis use disorder, moderate, dependence (HCC) 10/09/2014   Substance induced mood disorder (HCC) 10/08/2014   Polysubstance abuse (HCC) 03/05/2014   substance induced mood disorder 03/05/2014  Depressive disorder 03/05/2014   Preterm premature rupture of membranes (PPROM) delivered, current hospitalization 11/01/2012    Allergies: No Known Allergies Medications:  Current Outpatient Medications:    benzonatate (TESSALON PERLES) 100 MG capsule, Take 1 capsule (100 mg total) by mouth 3 (three) times daily as needed., Disp: 20 capsule, Rfl: 0   doxycycline  (VIBRA-TABS) 100 MG tablet, Take 1 tablet (100 mg total) by mouth 2 (two) times daily., Disp: 20 tablet, Rfl: 0   predniSONE (STERAPRED UNI-PAK 21 TAB) 10 MG (21) TBPK tablet, Use as directed, Disp: 21 tablet, Rfl: 0   acetaminophen (TYLENOL) 500 MG tablet, Take 2 tablets (1,000 mg total) by mouth every 6 (six) hours., Disp: 30 tablet, Rfl: 0   lidocaine (XYLOCAINE) 2 % solution, Use as directed 15 mLs in the mouth or throat every 6 (six) hours as needed for mouth pain., Disp: 100 mL, Rfl: 0   naproxen (NAPROSYN) 500 MG tablet, Take 1 tablet (500 mg total) by mouth 2 (two) times daily., Disp: 14 tablet, Rfl: 0   ondansetron (ZOFRAN-ODT) 4 MG disintegrating tablet, Take 1 tablet (4 mg total) by mouth every 8 (eight) hours as needed., Disp: 20 tablet, Rfl: 0   oxyCODONE (OXY IR/ROXICODONE) 5 MG immediate release tablet, Take 1-2 tablets (5-10 mg total) by mouth every 4 (four) hours as needed for moderate pain., Disp: 30 tablet, Rfl: 0  Observations/Objective: Patient is well-developed, well-nourished in no acute distress.  Resting comfortably  at home.  Head is normocephalic, atraumatic.  No labored breathing.  Speech is clear and coherent with logical content.  Patient is alert and oriented at baseline.    Assessment and Plan: 1. Acute bacterial bronchitis (Primary) - doxycycline (VIBRA-TABS) 100 MG tablet; Take 1 tablet (100 mg total) by mouth 2 (two) times daily.  Dispense: 20 tablet; Refill: 0 - benzonatate (TESSALON PERLES) 100 MG capsule; Take 1 capsule (100 mg total) by mouth 3 (three) times daily as needed.  Dispense: 20 capsule; Refill: 0 - predniSONE (STERAPRED UNI-PAK 21 TAB) 10 MG (21) TBPK tablet; Use as directed  Dispense: 21 tablet; Refill: 0  Will treat as bacterial infection.  Unsure if chest pain is related to MSK pain or more pneumonia. Advised her to be seen in the next few days for chest x-ray if chest pain worsens.  Prednisone should help cough and chest pain if MSK.  -  Take meds as prescribed - Use a cool mist humidifier  -Use saline nose sprays frequently -Force fluids -For any cough or congestion  Use plain Mucinex- regular strength or max strength is fine -For fever or aces or pains- take tylenol or ibuprofen. -Throat lozenges if help -Follow up if symptoms worsen or do not improve   Follow Up Instructions: I discussed the assessment and treatment plan with the patient. The patient was provided an opportunity to ask questions and all were answered. The patient agreed with the plan and demonstrated an understanding of the instructions.  A copy of instructions were sent to the patient via MyChart unless otherwise noted below.     The patient was advised to call back or seek an in-person evaluation if the symptoms worsen or if the condition fails to improve as anticipated.    Jannifer Rodney, FNP

## 2024-02-29 LAB — OB RESULTS CONSOLE ANTIBODY SCREEN: Antibody Screen: NEGATIVE

## 2024-02-29 LAB — OB RESULTS CONSOLE HIV ANTIBODY (ROUTINE TESTING): HIV: NONREACTIVE

## 2024-02-29 LAB — OB RESULTS CONSOLE GC/CHLAMYDIA
Chlamydia: NEGATIVE
Neisseria Gonorrhea: NEGATIVE

## 2024-02-29 LAB — OB RESULTS CONSOLE RUBELLA ANTIBODY, IGM: Rubella: IMMUNE

## 2024-02-29 LAB — OB RESULTS CONSOLE RPR: RPR: NONREACTIVE

## 2024-02-29 LAB — OB RESULTS CONSOLE HEPATITIS B SURFACE ANTIGEN: Hepatitis B Surface Ag: NEGATIVE

## 2024-04-12 LAB — HEPATITIS C ANTIBODY: HCV Ab: POSITIVE

## 2024-06-26 LAB — OB RESULTS CONSOLE HIV ANTIBODY (ROUTINE TESTING): HIV: NONREACTIVE

## 2024-06-26 LAB — OB RESULTS CONSOLE RPR: RPR: NONREACTIVE

## 2024-08-15 ENCOUNTER — Encounter (HOSPITAL_COMMUNITY): Payer: Self-pay | Admitting: *Deleted

## 2024-08-15 NOTE — Patient Instructions (Signed)
 Angela Henson  08/15/2024   Your procedure is scheduled on:  08/30/2024  Arrive at 0530 at Entrance C on [address] at [facility]  and [employer]. You are invited to use the FREE valet parking or use the Visitor's parking deck.  Pick up the phone at the desk and dial [phone].  Call this number if you have problems the morning of surgery: [phone]  Remember:   Do not eat food:(After Midnight) Desps de medianoche.  You may drink clear liquids until  __0330___.  Clear liquids means a liquid you can see thru.  It can have color such as Cola or Kool aid.  Tea is OK and coffee as long as no milk or creamer of any kind.  Take these medicines the morning of surgery with A SIP OF WATER :  none   Do not wear jewelry, make-up or nail polish.  Do not wear lotions, powders, or perfumes. Do not wear deodorant.  Do not shave 48 hours prior to surgery.  Do not bring valuables to the hospital.  [facility] is not   responsible for any belongings or valuables brought to the hospital.  Contacts, dentures or bridgework may not be worn into surgery.  Leave suitcase in the car. After surgery it may be brought to your room.  For patients admitted to the hospital, checkout time is 11:00 AM the day of              discharge.      Please read over the following fact sheets that you were given:     Preparing for Surgery

## 2024-08-16 ENCOUNTER — Inpatient Hospital Stay (HOSPITAL_COMMUNITY): Admitting: Anesthesiology

## 2024-08-16 ENCOUNTER — Other Ambulatory Visit: Payer: Self-pay

## 2024-08-16 ENCOUNTER — Inpatient Hospital Stay (HOSPITAL_COMMUNITY)
Admission: AD | Admit: 2024-08-16 | Discharge: 2024-08-19 | DRG: 784 | Disposition: A | Discharge status: Home / Self Care | Attending: Obstetrics and Gynecology | Admitting: Obstetrics and Gynecology

## 2024-08-16 ENCOUNTER — Encounter (HOSPITAL_COMMUNITY): Payer: Self-pay | Admitting: Obstetrics and Gynecology

## 2024-08-16 ENCOUNTER — Encounter (HOSPITAL_COMMUNITY)
Admission: AD | Disposition: A | Discharge status: Home / Self Care | Payer: Self-pay | Source: Home / Self Care | Attending: Obstetrics and Gynecology

## 2024-08-16 ENCOUNTER — Encounter (HOSPITAL_COMMUNITY): Payer: Self-pay

## 2024-08-16 DIAGNOSIS — Z98891 History of uterine scar from previous surgery: Principal | ICD-10-CM

## 2024-08-16 DIAGNOSIS — O4202 Full-term premature rupture of membranes, onset of labor within 24 hours of rupture: Secondary | ICD-10-CM | POA: Diagnosis not present

## 2024-08-16 DIAGNOSIS — O4292 Full-term premature rupture of membranes, unspecified as to length of time between rupture and onset of labor: Principal | ICD-10-CM | POA: Diagnosis present

## 2024-08-16 DIAGNOSIS — Z9889 Other specified postprocedural states: Principal | ICD-10-CM

## 2024-08-16 DIAGNOSIS — F419 Anxiety disorder, unspecified: Secondary | ICD-10-CM | POA: Diagnosis present

## 2024-08-16 DIAGNOSIS — O99344 Other mental disorders complicating childbirth: Secondary | ICD-10-CM | POA: Diagnosis present

## 2024-08-16 DIAGNOSIS — O9962 Diseases of the digestive system complicating childbirth: Secondary | ICD-10-CM | POA: Diagnosis present

## 2024-08-16 DIAGNOSIS — O34211 Maternal care for low transverse scar from previous cesarean delivery: Secondary | ICD-10-CM | POA: Diagnosis present

## 2024-08-16 DIAGNOSIS — K219 Gastro-esophageal reflux disease without esophagitis: Secondary | ICD-10-CM | POA: Diagnosis present

## 2024-08-16 DIAGNOSIS — D62 Acute posthemorrhagic anemia: Secondary | ICD-10-CM | POA: Diagnosis not present

## 2024-08-16 DIAGNOSIS — Z302 Encounter for sterilization: Secondary | ICD-10-CM

## 2024-08-16 DIAGNOSIS — F17210 Nicotine dependence, cigarettes, uncomplicated: Secondary | ICD-10-CM | POA: Diagnosis present

## 2024-08-16 DIAGNOSIS — O9842 Viral hepatitis complicating childbirth: Secondary | ICD-10-CM | POA: Diagnosis present

## 2024-08-16 DIAGNOSIS — Z3A37 37 weeks gestation of pregnancy: Secondary | ICD-10-CM | POA: Diagnosis not present

## 2024-08-16 DIAGNOSIS — O9081 Anemia of the puerperium: Secondary | ICD-10-CM | POA: Diagnosis not present

## 2024-08-16 DIAGNOSIS — O99334 Smoking (tobacco) complicating childbirth: Secondary | ICD-10-CM | POA: Diagnosis present

## 2024-08-16 DIAGNOSIS — B1920 Unspecified viral hepatitis C without hepatic coma: Secondary | ICD-10-CM | POA: Diagnosis present

## 2024-08-16 DIAGNOSIS — Z833 Family history of diabetes mellitus: Secondary | ICD-10-CM

## 2024-08-16 DIAGNOSIS — O98413 Viral hepatitis complicating pregnancy, third trimester: Secondary | ICD-10-CM | POA: Diagnosis not present

## 2024-08-16 HISTORY — DX: Other specified abnormal immunological findings in serum: R76.89

## 2024-08-16 LAB — COMPREHENSIVE METABOLIC PANEL WITH GFR
ALT: 17 U/L (ref 0–44)
AST: 28 U/L (ref 15–41)
Albumin: 2.8 g/dL — ABNORMAL LOW (ref 3.5–5.0)
Alkaline Phosphatase: 232 U/L — ABNORMAL HIGH (ref 38–126)
Anion gap: 12 (ref 5–15)
BUN: 13 mg/dL (ref 6–20)
CO2: 21 mmol/L — ABNORMAL LOW (ref 22–32)
Calcium: 9.1 mg/dL (ref 8.9–10.3)
Chloride: 100 mmol/L (ref 98–111)
Creatinine, Ser: 0.73 mg/dL (ref 0.44–1.00)
GFR, Estimated: >60 mL/min (ref >60)
Glucose, Bld: 70 mg/dL (ref 70–99)
Potassium: 4.1 mmol/L (ref 3.5–5.1)
Sodium: 133 mmol/L — ABNORMAL LOW (ref 135–145)
Total Bilirubin: 0.4 mg/dL (ref 0.0–1.2)
Total Protein: 6.9 g/dL (ref 6.5–8.1)

## 2024-08-16 LAB — CBC
HCT: 33.4 % — ABNORMAL LOW (ref 36.0–46.0)
Hemoglobin: 10.5 g/dL — ABNORMAL LOW (ref 12.0–15.0)
MCH: 24.3 pg — ABNORMAL LOW (ref 26.0–34.0)
MCHC: 31.4 g/dL (ref 30.0–36.0)
MCV: 77.3 fL — ABNORMAL LOW (ref 80.0–100.0)
Platelets: 312 K/uL (ref 150–400)
RBC: 4.32 MIL/uL (ref 3.87–5.11)
RDW: 15.4 % (ref 11.5–15.5)
WBC: 15.2 K/uL — ABNORMAL HIGH (ref 4.0–10.5)
nRBC: 0.7 % — ABNORMAL HIGH (ref 0.0–0.2)

## 2024-08-16 LAB — RAPID URINE DRUG SCREEN, HOSP PERFORMED
Amphetamines: NOT DETECTED
Barbiturates: NOT DETECTED
Benzodiazepines: POSITIVE — AB
Cocaine: NOT DETECTED
Opiates: NOT DETECTED
Tetrahydrocannabinol: POSITIVE — AB

## 2024-08-16 LAB — TYPE AND SCREEN
ABO/RH(D): B POS
Antibody Screen: NEGATIVE

## 2024-08-16 LAB — RAPID HIV SCREEN (HIV 1/2 AB+AG)
HIV 1/2 Antibodies: NONREACTIVE
HIV-1 P24 Antigen - HIV24: NONREACTIVE

## 2024-08-16 LAB — POCT FERN TEST: POCT Fern Test: POSITIVE

## 2024-08-16 SURGERY — Surgical Case
Anesthesia: Spinal | Site: Abdomen | Laterality: Bilateral

## 2024-08-16 MED ORDER — FENTANYL CITRATE (PF) 100 MCG/2ML IJ SOLN
INTRAMUSCULAR | Status: AC
  Filled 2024-08-16: qty 2

## 2024-08-16 MED ORDER — DIPHENHYDRAMINE HCL 50 MG/ML IJ SOLN: 12.5000 mg | INTRAMUSCULAR | Status: DC | PRN

## 2024-08-16 MED ORDER — WITCH HAZEL-GLYCERIN EX PADS: 1.0000 | MEDICATED_PAD | CUTANEOUS | Status: DC | PRN

## 2024-08-16 MED ORDER — SODIUM CHLORIDE 0.9 % IR SOLN
Status: DC | PRN
  Administered 2024-08-16: 1

## 2024-08-16 MED ORDER — KETOROLAC TROMETHAMINE 30 MG/ML IJ SOLN
INTRAMUSCULAR | Status: AC
  Filled 2024-08-16: qty 1

## 2024-08-16 MED ORDER — SODIUM CHLORIDE 0.9% FLUSH: 3.0000 mL | INTRAVENOUS | Status: DC | PRN

## 2024-08-16 MED ORDER — MIDAZOLAM HCL 2 MG/2ML IJ SOLN
INTRAMUSCULAR | Status: AC
  Filled 2024-08-16: qty 2

## 2024-08-16 MED ORDER — SCOPOLAMINE 1 MG/3DAYS TD PT72: 1.0000 | MEDICATED_PATCH | Freq: Once | TRANSDERMAL | Status: DC

## 2024-08-16 MED ORDER — DIPHENHYDRAMINE HCL 25 MG PO CAPS: 25.0000 mg | ORAL_CAPSULE | Freq: Four times a day (QID) | ORAL | Status: DC | PRN

## 2024-08-16 MED ORDER — ACETAMINOPHEN 500 MG PO TABS: 1000.0000 mg | ORAL_TABLET | Freq: Four times a day (QID) | ORAL | Status: DC

## 2024-08-16 MED ORDER — TRANEXAMIC ACID-NACL 1000-0.7 MG/100ML-% IV SOLN
INTRAVENOUS | Status: DC | PRN
  Administered 2024-08-16 (×2): 1000 mg via INTRAVENOUS

## 2024-08-16 MED ORDER — KETOROLAC TROMETHAMINE 30 MG/ML IJ SOLN
30.0000 mg | Freq: Four times a day (QID) | INTRAMUSCULAR | Status: DC | PRN
  Administered 2024-08-16: 30 mg via INTRAVENOUS

## 2024-08-16 MED ORDER — ACETAMINOPHEN 10 MG/ML IV SOLN
1000.0000 mg | Freq: Four times a day (QID) | INTRAVENOUS | Status: DC
  Administered 2024-08-16: 1000 mg via INTRAVENOUS
  Filled 2024-08-16 (×3): qty 100

## 2024-08-16 MED ORDER — OXYTOCIN-SODIUM CHLORIDE 30-0.9 UT/500ML-% IV SOLN
2.5000 [IU]/h | INTRAVENOUS | Status: AC
  Administered 2024-08-17: 2.5 [IU]/h via INTRAVENOUS
  Filled 2024-08-16: qty 500

## 2024-08-16 MED ORDER — SODIUM CHLORIDE 0.9 % IV SOLN
500.0000 mg | INTRAVENOUS | Status: AC
  Administered 2024-08-16: 500 mg via INTRAVENOUS

## 2024-08-16 MED ORDER — MEPERIDINE HCL 25 MG/ML IJ SOLN: 6.2500 mg | INTRAMUSCULAR | Status: DC | PRN

## 2024-08-16 MED ORDER — SIMETHICONE 80 MG PO CHEW
80.0000 mg | CHEWABLE_TABLET | Freq: Three times a day (TID) | ORAL | Status: DC
  Administered 2024-08-17 – 2024-08-19 (×6): 80 mg via ORAL
  Filled 2024-08-16 (×8): qty 1

## 2024-08-16 MED ORDER — ONDANSETRON HCL 4 MG/2ML IJ SOLN: 4.0000 mg | Freq: Three times a day (TID) | INTRAMUSCULAR | Status: DC | PRN

## 2024-08-16 MED ORDER — ZOLPIDEM TARTRATE 5 MG PO TABS: 5.0000 mg | ORAL_TABLET | Freq: Every evening | ORAL | Status: DC | PRN

## 2024-08-16 MED ORDER — ONDANSETRON HCL 4 MG/2ML IJ SOLN
INTRAMUSCULAR | Status: DC | PRN
  Administered 2024-08-16: 4 mg via INTRAVENOUS

## 2024-08-16 MED ORDER — IBUPROFEN 600 MG PO TABS
600.0000 mg | ORAL_TABLET | Freq: Four times a day (QID) | ORAL | Status: DC
Start: 2024-08-17 — End: 2024-08-19
  Administered 2024-08-17 – 2024-08-19 (×5): 600 mg via ORAL
  Filled 2024-08-16 (×8): qty 1

## 2024-08-16 MED ORDER — BUPIVACAINE IN DEXTROSE 0.75-8.25 % IT SOLN
INTRATHECAL | Status: DC | PRN
  Administered 2024-08-16: 1.6 mL via INTRATHECAL

## 2024-08-16 MED ORDER — NALOXONE HCL 4 MG/10ML IJ SOLN: 1.0000 ug/kg/h | INTRAVENOUS | Status: DC | PRN

## 2024-08-16 MED ORDER — MIDAZOLAM HCL (PF) 2 MG/2ML IJ SOLN
INTRAMUSCULAR | Status: DC | PRN
  Administered 2024-08-16: 2 mg via INTRAVENOUS
  Administered 2024-08-16 (×2): 1 mg via INTRAVENOUS

## 2024-08-16 MED ORDER — FENTANYL CITRATE (PF) 100 MCG/2ML IJ SOLN
INTRAMUSCULAR | Status: DC | PRN
  Administered 2024-08-16 (×2): 50 ug via INTRAVENOUS
  Administered 2024-08-16: 35 ug via INTRAVENOUS

## 2024-08-16 MED ORDER — SENNOSIDES-DOCUSATE SODIUM 8.6-50 MG PO TABS
2.0000 | ORAL_TABLET | Freq: Every day | ORAL | Status: DC
  Administered 2024-08-17 – 2024-08-19 (×3): 2 via ORAL
  Filled 2024-08-16 (×5): qty 2

## 2024-08-16 MED ORDER — MORPHINE SULFATE (PF) 0.5 MG/ML IJ SOLN: INTRAMUSCULAR | Status: DC | PRN

## 2024-08-16 MED ORDER — FENTANYL CITRATE (PF) 100 MCG/2ML IJ SOLN
25.0000 ug | INTRAMUSCULAR | Status: DC | PRN
  Administered 2024-08-16: 50 ug via INTRAVENOUS

## 2024-08-16 MED ORDER — LACTATED RINGERS IV SOLN: INTRAVENOUS | Status: DC | PRN

## 2024-08-16 MED ORDER — OXYTOCIN-SODIUM CHLORIDE 30-0.9 UT/500ML-% IV SOLN
INTRAVENOUS | Status: DC | PRN
Start: 2024-08-16 — End: 2024-08-16
  Administered 2024-08-16: 300 mL via INTRAVENOUS

## 2024-08-16 MED ORDER — SODIUM CHLORIDE 0.9 % IV SOLN
INTRAVENOUS | Status: AC
  Filled 2024-08-16: qty 5

## 2024-08-16 MED ORDER — PROPOFOL 10 MG/ML IV BOLUS
INTRAVENOUS | Status: DC | PRN
  Administered 2024-08-16: 200 mg via INTRAVENOUS

## 2024-08-16 MED ORDER — DEXAMETHASONE SOD PHOSPHATE PF 10 MG/ML IJ SOLN
INTRAMUSCULAR | Status: DC | PRN
  Administered 2024-08-16: 10 mg via INTRAVENOUS

## 2024-08-16 MED ORDER — SUCCINYLCHOLINE CHLORIDE 200 MG/10ML IV SOSY
PREFILLED_SYRINGE | INTRAVENOUS | Status: DC | PRN
  Administered 2024-08-16: 100 mg via INTRAVENOUS

## 2024-08-16 MED ORDER — CEFAZOLIN SODIUM-DEXTROSE 2-4 GM/100ML-% IV SOLN
2.0000 g | INTRAVENOUS | Status: AC
  Administered 2024-08-16: 2 g via INTRAVENOUS

## 2024-08-16 MED ORDER — FENTANYL CITRATE (PF) 100 MCG/2ML IJ SOLN
INTRAMUSCULAR | Status: DC | PRN
  Administered 2024-08-16: 15 ug via INTRATHECAL

## 2024-08-16 MED ORDER — KETOROLAC TROMETHAMINE 30 MG/ML IJ SOLN: 30.0000 mg | Freq: Four times a day (QID) | INTRAMUSCULAR | Status: DC | PRN

## 2024-08-16 MED ORDER — MORPHINE SULFATE (PF) 0.5 MG/ML IJ SOLN
INTRAMUSCULAR | Status: AC
  Filled 2024-08-16: qty 10

## 2024-08-16 MED ORDER — TRANEXAMIC ACID-NACL 1000-0.7 MG/100ML-% IV SOLN: 1000.0000 mg | Freq: Once | INTRAVENOUS | Status: DC

## 2024-08-16 MED ORDER — DIBUCAINE (PERIANAL) 1 % EX OINT: 1.0000 | TOPICAL_OINTMENT | CUTANEOUS | Status: DC | PRN

## 2024-08-16 MED ORDER — NALOXONE HCL 0.4 MG/ML IJ SOLN: 0.4000 mg | INTRAMUSCULAR | Status: DC | PRN

## 2024-08-16 MED ORDER — PRENATAL MULTIVITAMIN CH
1.0000 | ORAL_TABLET | Freq: Every day | ORAL | Status: DC
  Filled 2024-08-16 (×3): qty 1

## 2024-08-16 MED ORDER — DEXMEDETOMIDINE HCL IN NACL 80 MCG/20ML IV SOLN
INTRAVENOUS | Status: DC | PRN
Start: 2024-08-16 — End: 2024-08-16
  Administered 2024-08-16 (×4): 8 ug via INTRAVENOUS

## 2024-08-16 MED ORDER — MORPHINE SULFATE (PF) 0.5 MG/ML IJ SOLN
INTRAMUSCULAR | Status: DC | PRN
  Administered 2024-08-16: 150 ug via INTRATHECAL

## 2024-08-16 MED ORDER — STERILE WATER FOR IRRIGATION IR SOLN
Status: DC | PRN
  Administered 2024-08-16: 1

## 2024-08-16 MED ORDER — SIMETHICONE 80 MG PO CHEW: 80.0000 mg | CHEWABLE_TABLET | ORAL | Status: DC | PRN

## 2024-08-16 MED ORDER — MENTHOL 3 MG MT LOZG
1.0000 | LOZENGE | OROMUCOSAL | Status: DC | PRN
Start: 2024-08-16 — End: 2024-08-19
  Administered 2024-08-17: 3 mg via ORAL
  Filled 2024-08-16: qty 9

## 2024-08-16 MED ORDER — KETOROLAC TROMETHAMINE 30 MG/ML IJ SOLN
30.0000 mg | Freq: Four times a day (QID) | INTRAMUSCULAR | Status: AC
  Administered 2024-08-17 (×3): 30 mg via INTRAVENOUS
  Filled 2024-08-16 (×3): qty 1

## 2024-08-16 MED ORDER — COCONUT OIL OIL: 1.0000 | TOPICAL_OIL | Status: DC | PRN

## 2024-08-16 MED ORDER — SOD CITRATE-CITRIC ACID 500-334 MG/5ML PO SOLN
30.0000 mL | ORAL | Status: AC
  Administered 2024-08-16: 30 mL via ORAL
  Filled 2024-08-16: qty 30

## 2024-08-16 MED ORDER — DIPHENHYDRAMINE HCL 25 MG PO CAPS: 25.0000 mg | ORAL_CAPSULE | ORAL | Status: DC | PRN

## 2024-08-16 MED ORDER — OXYCODONE HCL 5 MG PO TABS
5.0000 mg | ORAL_TABLET | ORAL | Status: DC | PRN
  Administered 2024-08-17 – 2024-08-19 (×10): 10 mg via ORAL
  Filled 2024-08-16 (×12): qty 2

## 2024-08-16 MED ORDER — PHENYLEPHRINE HCL-NACL 20-0.9 MG/250ML-% IV SOLN
INTRAVENOUS | Status: DC | PRN
  Administered 2024-08-16: 60 ug/min via INTRAVENOUS

## 2024-08-16 MED ORDER — ACETAMINOPHEN 500 MG PO TABS
1000.0000 mg | ORAL_TABLET | Freq: Four times a day (QID) | ORAL | Status: DC
  Administered 2024-08-17 – 2024-08-19 (×10): 1000 mg via ORAL
  Filled 2024-08-16 (×11): qty 2

## 2024-08-16 SURGICAL SUPPLY — 34 items
BENZOIN TINCTURE PRP APPL 2/3 (GAUZE/BANDAGES/DRESSINGS) IMPLANT
CHLORAPREP W/TINT 26ML (MISCELLANEOUS) ×4 IMPLANT
CLAMP UMBILICAL CORD (MISCELLANEOUS) ×2 IMPLANT
CLOTH BEACON ORANGE TIMEOUT ST (SAFETY) ×2 IMPLANT
DERMABOND ADVANCED .7 DNX6 (GAUZE/BANDAGES/DRESSINGS) IMPLANT
DRAPE C SECTION CLR SCREEN (DRAPES) ×2 IMPLANT
DRSG OPSITE POSTOP 4X10 (GAUZE/BANDAGES/DRESSINGS) ×2 IMPLANT
ELECTRODE REM PT RTRN 9FT ADLT (ELECTROSURGICAL) ×2 IMPLANT
EXTRACTOR VACUUM KIWI (MISCELLANEOUS) IMPLANT
GLOVE BIO SURGEON STRL SZ 6.5 (GLOVE) ×2 IMPLANT
GLOVE BIOGEL PI IND STRL 7.0 (GLOVE) ×4 IMPLANT
GOWN STRL REUS W/TWL LRG LVL3 (GOWN DISPOSABLE) ×4 IMPLANT
HEMOSTAT ARISTA ABSORB 3G PWDR (HEMOSTASIS) IMPLANT
KIT ABG SYR 3ML LUER SLIP (SYRINGE) IMPLANT
MAT PREVALON FULL STRYKER (MISCELLANEOUS) IMPLANT
NDL HYPO 25X5/8 SAFETYGLIDE (NEEDLE) IMPLANT
NEEDLE HYPO 25X5/8 SAFETYGLIDE (NEEDLE) IMPLANT
NS IRRIG 1000ML POUR BTL (IV SOLUTION) ×2 IMPLANT
PACK C SECTION WH (CUSTOM PROCEDURE TRAY) ×2 IMPLANT
PAD OB MATERNITY 4.3X12.25 (PERSONAL CARE ITEMS) ×2 IMPLANT
RETRACTOR WND ALEXIS 25 LRG (MISCELLANEOUS) ×2 IMPLANT
RTRCTR C-SECT PINK 25CM LRG (MISCELLANEOUS) IMPLANT
STRIP CLOSURE SKIN 1/2X4 (GAUZE/BANDAGES/DRESSINGS) IMPLANT
SUT CHROMIC 1 CTX 36 (SUTURE) ×4 IMPLANT
SUT PDS AB 0 CTX 60 (SUTURE) IMPLANT
SUT PLAIN 0 NONE (SUTURE) IMPLANT
SUT VIC AB 0 CT1 27XBRD ANBCTR (SUTURE) ×4 IMPLANT
SUT VIC AB 2-0 CT1 TAPERPNT 27 (SUTURE) ×2 IMPLANT
SUT VIC AB 4-0 KS 27 (SUTURE) ×2 IMPLANT
SUT VICRYL+ 3-0 36IN CT-1 (SUTURE) IMPLANT
SUTURE PLAIN GUT 2.0 ETHICON (SUTURE) ×2 IMPLANT
TOWEL OR 17X24 6PK STRL BLUE (TOWEL DISPOSABLE) ×2 IMPLANT
TRAY FOLEY W/BAG SLVR 14FR LF (SET/KITS/TRAYS/PACK) ×2 IMPLANT
WATER STERILE IRR 1000ML POUR (IV SOLUTION) ×2 IMPLANT

## 2024-08-16 NOTE — Anesthesia Preprocedure Evaluation (Addendum)
 Anesthesia Evaluation  Patient identified by MRN, date of birth, ID band Patient awake    Reviewed: Allergy & Precautions, NPO status , Patient's Chart, lab work & pertinent test results  Airway Mallampati: II  TM Distance: >3 FB     Dental no notable dental hx. (+) Teeth Intact, Dental Advisory Given   Pulmonary Current SmokerPatient did not abstain from smoking.   Pulmonary exam normal breath sounds clear to auscultation       Cardiovascular negative cardio ROS Normal cardiovascular exam Rhythm:Regular Rate:Normal     Neuro/Psych  Headaches PSYCHIATRIC DISORDERS  Depression       GI/Hepatic ,GERD  ,,(+) Hepatitis -, CUntreated   Endo/Other  diabetes, Well Controlled, Gestational    Renal/GU   negative genitourinary   Musculoskeletal   Abdominal   Peds  Hematology  (+) Blood dyscrasia, anemia   Anesthesia Other Findings   Reproductive/Obstetrics (+) Pregnancy Hx/o Previous C/sections x 3                              Anesthesia Physical Anesthesia Plan  ASA: 2  Anesthesia Plan: Spinal   Post-op Pain Management: Minimal or no pain anticipated   Induction: Intravenous  PONV Risk Score and Plan: 3 and Treatment may vary due to age or medical condition  Airway Management Planned: Natural Airway  Additional Equipment: None and Fetal Monitoring  Intra-op Plan:   Post-operative Plan:   Informed Consent: I have reviewed the patients History and Physical, chart, labs and discussed the procedure including the risks, benefits and alternatives for the proposed anesthesia with the patient or authorized representative who has indicated his/her understanding and acceptance.     Dental advisory given  Plan Discussed with: Anesthesiologist and CRNA  Anesthesia Plan Comments:          Anesthesia Quick Evaluation

## 2024-08-16 NOTE — Anesthesia Procedure Notes (Signed)
 Spinal  Patient location during procedure: OR Start time: 08/16/2024 5:40 PM End time: 08/16/2024 5:45 PM Reason for block: surgical anesthesia Staffing Performed: anesthesiologist  Anesthesiologist: Erma Thom SAUNDERS, MD Performed by: Erma Thom SAUNDERS, MD Authorized by: Epifanio Charleston, MD   Preanesthetic Checklist Completed: patient identified, IV checked, site marked, risks and benefits discussed, surgical consent, monitors and equipment checked, pre-op evaluation and timeout performed Spinal Block Patient position: sitting Prep: DuraPrep Patient monitoring: heart rate, cardiac monitor, continuous pulse ox and blood pressure Approach: midline Location: L4-5 Needle Needle type: Pencan  Needle gauge: 24 G Assessment Sensory level: T6 Additional Notes Functioning IV was confirmed and monitors were applied. Sterile prep and drape, including hand hygiene and sterile gloves were used. The patient was positioned and the spine was prepped. The skin was anesthetized with lidocaine .  Free flow of clear CSF was obtained prior to injecting local anesthetic into the CSF.  The spinal needle aspirated freely following injection.  The needle was carefully withdrawn.  The patient tolerated the procedure well.

## 2024-08-16 NOTE — H&P (Signed)
 Angela Henson is a 33 y.o. [phone] female at [redacted]w[redacted]d presenting for prelabor rupture of membranes. She reports a large gus of clear fluid at 1pm. She last ate at 11:30am. She denies vaginal bleeding, contractions, and decreased fetal movement. She reports some low back pain and strong anxiety. She is otherwise feeling well.   Her pregnancy is otherwise complicated by: -history of prior C-section x 3: most recent op note reviewed, no major adhesions or complications. Fundal placenta.  -desired permanent sterilization: confirmed on admission -Hepatitis C infection: Viral load of 3.43 million on 10/16 -anemia: Hgb 10.2 on 10/16 -history of pre-eclampsia in prior pregnancy -GBS unknown: declined testing -h/o polyhydramnios in this pregnancy: resolved OB History     Gravida  5   Para  3   Term  2   Preterm  1   AB  1   Living  3      SAB  1   IAB  0   Ectopic  0   Multiple  0   Live Births  3          Past Medical History:  Diagnosis Date   Gestational diabetes    Headache(784.0)    Hepatitis C antibody positive    History of pre-eclampsia    Hx of chlamydia infection    Hx of varicella    Past Surgical History:  Procedure Laterality Date   CESAREAN SECTION  11/03/2012   Procedure: CESAREAN SECTION;  Surgeon: Debby JULIANNA Lares, MD;  Location: [facility];  Service: Obstetrics;  Laterality: N/A;  Primary cesarean section with delivery of baby boy at 26. Apgars 8/9.   CESAREAN SECTION N/A 03/22/2016   Procedure: REPEAT CESAREAN SECTION;  Surgeon: Ted Rogena Solo, DO;  Location: [facility];  Service: Obstetrics;  Laterality: N/A;  Heather K to RNFA- confirmed   CESAREAN SECTION N/A 06/26/2021   Procedure: CESAREAN SECTION;  Surgeon: Solo Ted Rogena, DO;  Location: [facility] ORS;  Service: Obstetrics;  Laterality: N/A;  request RNFA or 2nd scrub tech   INCISION AND DRAINAGE ABSCESS ANAL  10/25/2011   I&D of right buttock   WISDOM TOOTH EXTRACTION     Family  History: family history includes Diabetes in her father. Social History:  reports that she has been smoking cigarettes. She started smoking about 16 years ago. She has a 4 pack-year smoking history. She has never used smokeless tobacco. She reports that she does not drink alcohol and does not use drugs.     Maternal Diabetes: No Genetic Screening: Declined Maternal Ultrasounds/Referrals: Normal Fetal Ultrasounds or other Referrals:  None Maternal Substance Abuse:  history of prior drug use.  Significant Maternal Medications:  None Significant Maternal Lab Results:  Other: Hep C positive, declined GBS testing Number of Prenatal Visits:greater than 3 verified prenatal visits Maternal Vaccinations:TDap  Review of Systems  All other systems reviewed and are negative.  Maternal Medical History:  Reason for admission: Rupture of membranes.   Contractions: Onset was 3-5 hours ago.   Fetal activity: Perceived fetal activity is normal.   Prenatal complications: Infection and polyhydramnios.   No bleeding, cholelithiasis, HIV, PIH, IUGR, nephrolithiasis, oligohydramnios, placental abnormality, pre-eclampsia, preterm labor, substance abuse, thrombocytopenia or thrombophilia.   Prenatal Complications - Diabetes: none.     Blood pressure (!) 104/56, pulse 89, temperature 98.2 F (36.8 C), temperature source Oral, resp. rate 18, height 4' 11 (1.499 m), weight 61.7 kg, SpO2 99%, unknown if currently breastfeeding. Maternal Exam:  Abdomen: Patient  reports no abdominal tenderness. Estimated fetal weight is 7lb.   Fetal presentation: vertex Introitus: Ferning test: positive.    Fetal Exam Fetal Monitor Review: Baseline rate: 120.  Variability: moderate (6-25 bpm).   Pattern: accelerations present and variable decelerations.   Fetal State Assessment: Category II - tracings are indeterminate.   Physical Exam Vitals reviewed.  Constitutional:      Appearance: Normal appearance.  HENT:      Head: Normocephalic.  Eyes:     Extraocular Movements: Extraocular movements intact.  Cardiovascular:     Comments: Well perfused Pulmonary:     Effort: Pulmonary effort is normal.  Abdominal:     Comments: Gravid, non-tender  Musculoskeletal:        General: Normal range of motion.  Skin:    General: Skin is warm and dry.  Neurological:     General: No focal deficit present.     Mental Status: She is alert and oriented to person, place, and time.  Psychiatric:        Behavior: Behavior normal.        Thought Content: Thought content normal.        Judgment: Judgment normal.     Prenatal labs: ABO, Rh: --/--/PENDING (10/24 1655) Antibody: PENDING (10/24 1655) Rubella: Immune (05/08 0000) RPR: Nonreactive (05/08 0000)  HBsAg: Negative (05/08 0000)  HIV: Non-reactive (09/03 0000)  GBS:   unk  Assessment/Plan: Angela Henson is a 33 y.o. [id number] female at [redacted]w[redacted]d presenting for prelabor rupture of membranes. -history of prior C-section x 3: most recent op note reviewed, no major adhesions or complications. Fundal placenta. Plan repeat urgently due to concern for vertical transmission of Hep C. High OBH risk. Ancef  and azithro for infectious ppx. SCDs for VTE ppx. Consented as below.  -desired permanent sterilization: confirmed on admission. Will perform bilateral salpingectomy at time of C-section.  -Hepatitis C infection: Viral load of 3.43 million on 10/16. Standard PPE precautions. F/u with infectious disease. Patient uncertain regarding breastfeeding.  -anemia: Hgb 10.5 on admit.  -history of pre-eclampsia in prior pregnancy -GBS unknown: declined testing -h/o polyhydramnios in this pregnancy: resolved -h/o drug use in prior documentation: patient consents to admission urine drug screen.   Dispo: To OR.    We thoroughly discussed the risks and benefits of of a repeat C-section and bilateral salpingectomy. Risks include bleeding (she would accept a blood transfusion in an  emergency), damage to surrounding structures, and infection. We discussed that any of these complications could lead to the need for longer hospitalization, need for additional procedures/surgeries, or need for more medications. She is also aware of the risk of regret with sterilization. All questions were answered and patient voiced understanding of risks. She would like to proceed with the surgery.    Marga Gramajo A Jermaine Tholl 08/16/2024, 5:16 PM

## 2024-08-16 NOTE — Anesthesia Postprocedure Evaluation (Signed)
 Anesthesia Post Note  Patient: Angela Henson  Procedure(s) Performed: CESAREAN SECTION, WITH BILATERAL TUBAL LIGATION (Bilateral: Abdomen)     Patient location during evaluation: PACU Anesthesia Type: Spinal and General Level of consciousness: awake and alert Pain management: pain level controlled Vital Signs Assessment: post-procedure vital signs reviewed and stable Respiratory status: spontaneous breathing, nonlabored ventilation, respiratory function stable and patient connected to nasal cannula oxygen Cardiovascular status: blood pressure returned to baseline and stable Postop Assessment: no apparent nausea or vomiting Anesthetic complications: no   No notable events documented.  Last Vitals:  Vitals:   08/16/24 2050 08/16/24 2051  BP:    Pulse: 82 79  Resp: 11 15  Temp:    SpO2: 100% 100%    Last Pain:  Vitals:   08/16/24 2051  TempSrc:   PainSc: 8    Pain Goal:                   [name]

## 2024-08-16 NOTE — Progress Notes (Signed)
 Called Dr. Claire to address positive toxicology in the past. Asked if the pt needs a more current screen as an indicator of the need for the baby to be screened. Dr. Claire stated that she would discuss with the patient and then put an order in if indicated.

## 2024-08-16 NOTE — Op Note (Signed)
 CESAREAN SECTION Procedure Note  Patient: Angela Henson  Preoperative Diagnosis: IUP at [redacted]w[redacted]d, prelabor rupture of membranes, history of C-sections x3, Hepatitis C with a positive viral load, desired permanent sterilization Postoperative Diagnosis: same, delivered  Procedure: repeat low transverse C-section and bilateral salpingectomy     Surgeon: Rubie Husky, MD  Assistant surgeon: Leeroy Pouch, MD  A skilled assistant was required for this case due to its complexity.  Anesthesia: Spinal anesthesia converted to general anesthesia  Findings: Normal appearing uterus, fallopian tubes bilaterally (removed), and ovaries bilaterally.  Viable female infant in vertex presentation delivered at 18:27 with weight 6lb 9.5oz; Apgars 7 and 8.  Estimated Blood Loss:  512cc         Specimens: Placenta and bilateral fallopian tubes to pathology         Complications:  severe anxiety vs. Poor pain control requiring general anesthesia during surgery         Disposition: PACU - hemodynamically stable.         Condition: stable    Description of Procedure: The patient was taken to the operating room where spinal anesthesia was placed and found to be adequate. The patient was placed in the dorsal supine position.  Fetal heart tones were confirmed. SCD were applied and cycling. A foley catheter was inserted and draining. Ancef  2g and 500mg  of azithromycin  were given for infection prophylaxis. The patient was subsequently prepped and draped in the normal sterile fashion.  A routine pre-operative time out was performed. TXA was given to the patient.  A low transverse skin incision was made with a scalpel at the site of prior C-section and carried down to the level of the fascia with the Bovie.  The fascia was incised in the midline with the scalpel and extended laterally with curved Mayo scissors.  Kocher clamps were applied to the superior fascial edge and the fascia was dissected off the rectus muscle  sharply using the Mayo scissors. The rectus muscles then were separated in the midline.  The peritoneum was found free of adherent bowel and the peritoneal cavity was entered bluntly.  The uterus was identified and the alexis retractor was placed intraperitoneal.  A bladder flap was then created sharply with Metzenbaum scissors and separated from the lower uterine segment digitally. Patient anxiety vs. control was not well controlled and decision as made to place patient under general anesthesia. I continued surgery after confirmation by anesthesia providers that I should do so.   A low transverse hysterotomy was then made with a scalpel. Light meconium stained fluid was noted. The infant was found in the vertex presentation was delivered atraumatically and without difficulty with standard maneuvers. No nuchal. Baby had good tone and cry and routine bulb suction of nares was performed. After 60 seconds of delayed cord clamping the cord was clamped and cut and the infant was handed off to the baby nurse. The placenta was delivered with gentle traction on umbilical cord and manual massage of the uterine fundus.  The uterus was cleared of all clot and debris.  The hysterotomy was then closed with 0 monocryl in a running locked fashion. Attention was turned to the left fallopian tube which was elevated away from the mesosalpinx by Babcock clamps. The Ligasure was used to cauterize and ligate across the mesosalpinx from the fimbriae to the cornua and then used to come across the cornua to remove the tube. The specimen was handed off to the scrub tech. This procedure was repeated on the  right tube.   Light oozing was noted of the peritoneum overlying the bladder. This was rendered hemostatic with light Bovie use followed by Arista. An imbricating layer was placed using 0 monocryl over the hysterotomy with good hemostasis. The hysterotomy was found to be hemostatic after light Bovie use on serosal edges.   The  Alexis retractor was removed. The fascia was closed with a 0 PDS suture in a continuous running fashion.  The subcutaneous tissue was irrigated and rendered hemostatic with cautery.  The skin was closed with 4-0 vicryl  in a running subcuticular fashion.  Sponge, lap and needle counts were correct. Dermabond and a Honeycomb dressing were placed on the incision.  The patient was doing well when I left the OR. Baby was doing well, with FOB in PACU. I debriefed the situation with FOB, who voiced understanding and gratitude for [place]'s care.   Rubie Husky, MD 08/16/2024, 19:35

## 2024-08-16 NOTE — MAU Note (Signed)
 MAU Labor Triage Note:  .Angela Henson is a 33 y.o. at [redacted]w[redacted]d here in MAU reporting:  Contractions every: irregular  Pain Score: 6  Pain Location: Abdomen  ROM: 1300 today, clear fluid Vaginal Bleeding: none Last SVE: none, scheduled repeat c/s Labor Pain Management Plan: repeat c/s  GBS: Unknown  Fetal Movement: Reports positive FM FHT: Fetal Heart Rate Mode: External Baseline Rate (A): 130 bpm Multiple birth?: No  Vitals:   08/16/24 1546  BP: 115/73  Pulse: 97  Resp: 18  Temp: 98.2 F (36.8 C)  SpO2: 99%      Lab orders placed from triage: MAU Labor Eval and Fern swab OB Office: [place]

## 2024-08-16 NOTE — Transfer of Care (Signed)
 Immediate Anesthesia Transfer of Care Note  Patient: Angela Henson  Procedure(s) Performed: CESAREAN SECTION, WITH BILATERAL TUBAL LIGATION (Bilateral: Abdomen)  Patient Location: PACU  Anesthesia Type:General  Level of Consciousness: awake, alert , and oriented  Airway & Oxygen Therapy: Patient Spontanous Breathing and Patient connected to nasal cannula oxygen  Post-op Assessment: Report given to RN and Post -op Vital signs reviewed and stable  Post vital signs: Reviewed and stable  Last Vitals:  Vitals Value Taken Time  BP 111/73 08/16/24 19:45  Temp    Pulse 95 08/16/24 19:48  Resp 18 08/16/24 19:48  SpO2 100 % 08/16/24 19:48  Vitals shown include unfiled device data.  Last Pain:  Vitals:   08/16/24 1551  TempSrc:   PainSc: 6          Complications: No notable events documented.

## 2024-08-17 ENCOUNTER — Encounter (HOSPITAL_COMMUNITY): Payer: Self-pay | Admitting: Obstetrics and Gynecology

## 2024-08-17 LAB — CBC
HCT: 23.7 % — ABNORMAL LOW (ref 36.0–46.0)
Hemoglobin: 7.5 g/dL — ABNORMAL LOW (ref 12.0–15.0)
MCH: 24.4 pg — ABNORMAL LOW (ref 26.0–34.0)
MCHC: 31.6 g/dL (ref 30.0–36.0)
MCV: 77.2 fL — ABNORMAL LOW (ref 80.0–100.0)
Platelets: 248 K/uL (ref 150–400)
RBC: 3.07 MIL/uL — ABNORMAL LOW (ref 3.87–5.11)
RDW: 15.3 % (ref 11.5–15.5)
WBC: 20.4 K/uL — ABNORMAL HIGH (ref 4.0–10.5)
nRBC: 0 % (ref 0.0–0.2)

## 2024-08-17 LAB — RPR: RPR Ser Ql: NONREACTIVE

## 2024-08-17 MED ORDER — DIPHENHYDRAMINE HCL 50 MG/ML IJ SOLN: 25.0000 mg | Freq: Once | INTRAMUSCULAR | Status: DC | PRN

## 2024-08-17 MED ORDER — SODIUM CHLORIDE 0.9 % IV SOLN: INTRAVENOUS | Status: DC | PRN

## 2024-08-17 MED ORDER — SODIUM CHLORIDE 0.9 % IV BOLUS: 500.0000 mL | Freq: Once | INTRAVENOUS | Status: DC | PRN

## 2024-08-17 MED ORDER — IRON SUCROSE 500 MG IVPB - SIMPLE MED
500.0000 mg | Freq: Once | INTRAVENOUS | Status: DC
  Filled 2024-08-17: qty 275

## 2024-08-17 MED ORDER — EPINEPHRINE 0.3 MG/0.3ML IJ SOAJ: 0.3000 mg | Freq: Once | INTRAMUSCULAR | Status: DC | PRN

## 2024-08-17 MED ORDER — SODIUM CHLORIDE 0.9 % IV SOLN
500.0000 mg | Freq: Once | INTRAVENOUS | Status: AC
  Administered 2024-08-17: 500 mg via INTRAVENOUS
  Filled 2024-08-17: qty 25

## 2024-08-17 MED ORDER — METHYLPREDNISOLONE SODIUM SUCC 125 MG IJ SOLR
40.0000 mg | Freq: Once | INTRAMUSCULAR | Status: AC
Start: 2024-08-17 — End: 2024-08-17
  Administered 2024-08-17: 40 mg via INTRAVENOUS
  Filled 2024-08-17: qty 2

## 2024-08-17 MED ORDER — SODIUM CHLORIDE 0.9 % IV SOLN: INTRAVENOUS | Status: AC | PRN

## 2024-08-17 MED ORDER — ACETAMINOPHEN 500 MG PO TABS: 1000.0000 mg | ORAL_TABLET | Freq: Once | ORAL | Status: DC

## 2024-08-17 MED ORDER — ALBUTEROL SULFATE (2.5 MG/3ML) 0.083% IN NEBU: 2.5000 mg | INHALATION_SOLUTION | Freq: Once | RESPIRATORY_TRACT | Status: DC | PRN

## 2024-08-17 MED ORDER — METHYLPREDNISOLONE SODIUM SUCC 125 MG IJ SOLR: 125.0000 mg | Freq: Once | INTRAMUSCULAR | Status: DC | PRN

## 2024-08-17 MED ORDER — LORATADINE 10 MG PO TABS
10.0000 mg | ORAL_TABLET | Freq: Once | ORAL | Status: AC
Start: 2024-08-17 — End: 2024-08-17
  Administered 2024-08-17: 10 mg via ORAL
  Filled 2024-08-17: qty 1
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
## 2024-08-17 NOTE — Progress Notes (Signed)
 Subjective: [name] reports incisional pain, tolerating PO, + flatus, + BM, and no problems voiding. She admits to continued abdominal pain with fundal rub - some anxiety preceeding exams. She reports pain otherwise well controlled. Denies CP, SOB or lightheadedness. She is bonding well with daughter, Colbert. Attempted breastfeeding but too painful so bottlefeeding now. Has no complaints.   Objective: Vital signs in last 24 hours: Temp:  [98 F (36.7 C)-98.7 F (37.1 C)] 98 F (36.7 C) (10/25 0450) Pulse Rate:  [65-102] 65 (10/25 0450) Resp:  [10-24] 20 (10/25 0450) BP: (104-132)/(53-86) 117/70 (10/25 0450) SpO2:  [97 %-100 %] 100 % (10/25 0450) Weight:  [61.7 kg] 61.7 kg (10/24 1546)  Physical Exam:  General: alert, cooperative, and conversant Lochia: appropriate Uterine Fundus: firm; tender on exam - moderate apprehension expressed  Incision: no significant drainage DVT Evaluation: No evidence of DVT seen on physical exam.  Recent Labs    08/16/24 1655 08/17/24 0457  HGB 10.5* 7.5*  HCT 33.4* 23.7*    Assessment/Plan: Status post Cesarean section. Doing well postoperatively.  Pain control: oxy/ ibuprofen   Chronic anemia exacerbated by blood loss during delivery - pt asymptomatic but given how low Hg is, offered iv fereheme and agrees. Hep C - will get ID consult while pt inpt. Had rising titers in past week after initially being stable ( VL 3.60mill on 10/16) GBS unknown  Hx preE in previous pregnancy; BP stable S/P sterilization  Anxiety disorder - will evaluate and treat if persists  Ted LELON Solo, DO 08/17/2024, 9:23 AM

## 2024-08-17 NOTE — Lactation Note (Signed)
 This note was copied from a baby's chart. Lactation Consultation Note  Patient Name: Angela Henson Unijb'd Date: 08/17/2024 Age:31 hours Reason for consult: Initial assessment;Early term 37-38.6wks  P4 Mom didn't BF her other children. Mom stated she is just going to BF for a little bit for the baby to get her colostrum. LC placed baby to the breast but the baby isn't interested in feeding right now. Sleepy. Newborn feeding habits, behavior, STS, I&O, positioning, body alignment reviewed. Mom encouraged to feed baby 8-12 times/24 hours and with feeding cues.  Discussed w/mom if her nipples cracked or if she sees blood not to BF or give the baby her milk. Mom stated yes because of the Hep. C LC stated yes. Mom stated she was going to be careful for that. LC taught hand expression collecting a little bit of colostrum and spoon feeding baby to remind her she might be hungry.  Baby took colostrum but wouldn't open her mouth for latching. Mom holding baby STS. Since baby isn't interested encouraged mom to try again in 3 hrs unless cues before then.   Maternal Data Has patient been taught Hand Expression?: Yes Does the patient have breastfeeding experience prior to this delivery?: No  Feeding    LATCH Score Latch: Too sleepy or reluctant, no latch achieved, no sucking elicited.  Audible Swallowing: None  Type of Nipple: Everted at rest and after stimulation  Comfort (Breast/Nipple): Soft / non-tender  Hold (Positioning): Assistance needed to correctly position infant at breast and maintain latch.  LATCH Score: 5   Lactation Tools Discussed/Used    Interventions Interventions: Breast feeding basics reviewed;Assisted with latch;Skin to skin;Breast massage;Hand express;Breast compression;Adjust position;Support pillows;Position options;Expressed milk;Education;LC Services brochure  Discharge Pump: Referral sent for [facility] Pump [facility] Program: Yes  Consult Status Consult Status:  Follow-up Date: 08/17/24 Follow-up type: In-patient    Tinleigh Whitmire G 08/17/2024, 12:32 AM

## 2024-08-17 NOTE — Progress Notes (Signed)
 Patient ID: Angela Henson, female   DOB: 01-01-1991, 33 y.o.   MRN: [id number] Spoke to Dr Kathie ( ID) - will likely see pt on Monday 08/19/24. Will consider options for inpt vs outpt mgmt of Hep C

## 2024-08-17 NOTE — Lactation Note (Signed)
 This note was copied from a baby's chart. Lactation Consultation Note  Patient Name: Angela Henson Unijb'd Date: 08/17/2024 Age:31 hours  [name] informed LC she has decided to formula feed infant only due to infant having low blood sugars and not liking how pumping feels. LC acknowledge [name] feelings and choice. LC informed LC if she changes her mind we are here to support her.    Maternal Data    Feeding    LATCH Score                    Lactation Tools Discussed/Used    Interventions    Discharge    Consult Status      [name] 08/17/2024, 10:49 AM

## 2024-08-18 MED ORDER — SERTRALINE HCL 50 MG PO TABS
50.0000 mg | ORAL_TABLET | Freq: Every day | ORAL | Status: DC
  Filled 2024-08-18 (×3): qty 1

## 2024-08-18 NOTE — Progress Notes (Signed)
 N. [place] took DEBP to patient Rm 502
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
## 2024-08-19 DIAGNOSIS — Z9889 Other specified postprocedural states: Secondary | ICD-10-CM

## 2024-08-19 MED ORDER — ACETAMINOPHEN 500 MG PO TABS: 500.0000 mg | ORAL_TABLET | Freq: Four times a day (QID) | ORAL | Status: AC

## 2024-08-19 MED ORDER — OXYCODONE HCL 5 MG PO TABS: 5.0000 mg | ORAL_TABLET | ORAL | 0 refills | Status: AC | PRN

## 2024-08-19 MED ORDER — IBUPROFEN 600 MG PO TABS: 600.0000 mg | ORAL_TABLET | Freq: Four times a day (QID) | ORAL | Status: AC

## 2024-08-19 MED ORDER — MEDROXYPROGESTERONE ACETATE 150 MG/ML IM SUSP
150.0000 mg | Freq: Once | INTRAMUSCULAR | Status: AC
  Administered 2024-08-19: 150 mg via INTRAMUSCULAR
  Filled 2024-08-19: qty 1

## 2024-08-19 MED ORDER — FERROUS SULFATE 325 (65 FE) MG PO TABS: 325.0000 mg | ORAL_TABLET | ORAL | 1 refills | Status: AC

## 2024-08-19 NOTE — Discharge Summary (Incomplete)
 Postpartum Discharge Summary  Date of Service updated***     Patient Name: Angela Henson DOB: 07-13-1991 MRN: [id number]  Date of admission: 08/16/2024 Delivery date:08/16/2024 Delivering provider: CLAIRE RAMAN A Date of discharge: 08/19/2024  Admitting diagnosis: Post-operative state [Z98.890] Intrauterine pregnancy: [redacted]w[redacted]d     Secondary diagnosis:  Principal Problem:   Post-operative state  Additional problems: ***    Discharge diagnosis: {DX.:23714}                                              Post partum procedures:{Postpartum procedures:23558} Augmentation: {Augmentation:20782} Complications: {OB Labor/Delivery Complications:20784}  Hospital course: {Courses:23701}  Magnesium  Sulfate received: {Mag received:30440022} BMZ received: {BMZ received:30440023} Rhophylac:{Rhophylac received:30440032} FFM:{FFM:69559966} T-DaP:{Tdap:23962} Flu: {Qol:76036} RSV Vaccine received: {RSV:31013} Transfusion:{Transfusion received:30440034} Immunizations administered: Immunization History  Administered Date(s) Administered   Tdap 01/06/2016    Physical exam  Vitals:   08/18/24 0514 08/18/24 1838 08/18/24 2343 08/19/24 0654  BP: 118/81 117/68 119/72 119/83  Pulse: 86 80 80 94  Resp: 20 18 20 20   Temp: 98.3 F (36.8 C) 98 F (36.7 C) 98.9 F (37.2 C) 98.5 F (36.9 C)  TempSrc: Oral  Oral Oral  SpO2: 100% 100% 100% 100%  Weight:      Height:       General: {Exam; general:21111117} Lochia: {Desc; appropriate/inappropriate:30686::appropriate} Uterine Fundus: {Desc; firm/soft:30687} Incision: {Exam; incision:21111123} DVT Evaluation: {Exam; dvt:2111122} Labs: Lab Results  Component Value Date   WBC 20.4 (H) 08/17/2024   HGB 7.5 (L) 08/17/2024   HCT 23.7 (L) 08/17/2024   MCV 77.2 (L) 08/17/2024   PLT 248 08/17/2024      Latest Ref Rng & Units 08/16/2024    4:55 PM  CMP  Glucose 70 - 99 mg/dL 70   BUN 6 - 20 mg/dL 13   Creatinine 9.55 - 1.00 mg/dL 9.26    Sodium 864 - 854 mmol/L 133   Potassium 3.5 - 5.1 mmol/L 4.1   Chloride 98 - 111 mmol/L 100   CO2 22 - 32 mmol/L 21   Calcium 8.9 - 10.3 mg/dL 9.1   Total Protein 6.5 - 8.1 g/dL 6.9   Total Bilirubin 0.0 - 1.2 mg/dL 0.4   Alkaline Phos 38 - 126 U/L 232   AST 15 - 41 U/L 28   ALT 0 - 44 U/L 17    Edinburgh Score:    08/19/2024   12:36 AM  Edinburgh Postnatal Depression Scale Screening Tool  I have been able to laugh and see the funny side of things. --      After visit meds:  Allergies as of 08/19/2024   No Known Allergies      Medication List     STOP taking these medications    benzonatate  100 MG capsule Commonly known as: Tessalon  Perles   doxycycline  100 MG tablet Commonly known as: VIBRA -TABS   ferrous sulfate  325 (65 FE) MG EC tablet Replaced by: ferrous sulfate  325 (65 FE) MG tablet   lidocaine  2 % solution Commonly known as: XYLOCAINE    naproxen  500 MG tablet Commonly known as: NAPROSYN    ondansetron  4 MG disintegrating tablet Commonly known as: ZOFRAN -ODT   predniSONE  10 MG (21) Tbpk tablet Commonly known as: STERAPRED UNI-PAK 21 TAB       TAKE these medications    acetaminophen  500 MG tablet Commonly known as: TYLENOL  Take 1 tablet (  500 mg total) by mouth every 6 (six) hours. What changed:  how much to take when to take this   ferrous sulfate  325 (65 FE) MG tablet Take 1 tablet (325 mg total) by mouth every other day. Replaces: ferrous sulfate  325 (65 FE) MG EC tablet   ibuprofen  600 MG tablet Commonly known as: ADVIL  Take 1 tablet (600 mg total) by mouth every 6 (six) hours.   oxyCODONE  5 MG immediate release tablet Commonly known as: Oxy IR/ROXICODONE  Take 1 tablet (5 mg total) by mouth every 4 (four) hours as needed for moderate pain (pain score 4-6). What changed: how much to take         Discharge home in stable condition Infant Feeding: {Baby feeding:23562} Infant Disposition:{CHL IP OB HOME WITH  FNUYZM:76418} Discharge instruction: per After Visit Summary and Postpartum booklet. Activity: Advance as tolerated. Pelvic rest for 6 weeks.  Diet: {OB diet:21111121} Anticipated Birth Control: {Birth Control:23956} Postpartum Appointment:{Outpatient follow up:23559} Additional Postpartum F/U: {PP Procedure:23957} Future Appointments:No future appointments. Follow up Visit:  Follow-up Information     [facility]. Schedule an appointment as soon as possible for a visit in 1 week(s).   Contact information: [address]  [address] [place] [address] [phone]                     08/19/2024 Rubie DELENA Husky, MD

## 2024-08-19 NOTE — Progress Notes (Signed)
 Came by to speak with Kaira about Hepatitis C treatment now that she has delivered.  She plans to bottle feed baby. BTL for contraception going forward.  She estimates she has had it a few years and knew about it when she was pregnant and delivered her 31 yo son.   RNA level 3.43 million copies 10/16 and initially referred to [facility] however she prefers an appt local here in [place] for easier access to treatmetn.   We discussed treatment going forward. Would absolutely support her to establish a bit of a routine now while she recovers form C/Section and new post partum period to ensure success with her medication compliance for treatment.  She feels like an appt 3-4 weeks out during school time so it does not interfere with other older children would be idea.   Appt set November 18th with me at 10:45 am to discuss treatment and complete work up with genotype and fibrosis assessment.  Very nice, seems motivated to get this treated.   All questions answered she had for me today. Appt information placed on D/C navigator    Corean Fireman, MSN, NP-C [facility] for Infectious Disease [facility]  [email or web address] Pager: [phone] Office: [phone] [facility] Main Line: [phone] *Secure Chat Communication Welcome  Total Encounter Time: 10 min

## 2024-08-19 NOTE — Progress Notes (Signed)
 Subjective: Postpartum Day 3: Cesarean Delivery Elona is feeling well this morning and grateful for her care. We debriefed her delivery. She doesn't remember much and is understanding of the reason that she had to go under general anesthesia. All questions answered. Her pain has improved. She is ambulating and voiding well. Passing gas but has not yet had a bowel movement. Tolerating PO. Lochia appropriate. She has decided not to breastfeed for now, but will see if her milk comes in.   Objective: Vital signs in last 24 hours: Temp:  [98 F (36.7 C)-98.9 F (37.2 C)] 98.5 F (36.9 C) (10/27 0654) Pulse Rate:  [80-94] 94 (10/27 0654) Resp:  [18-20] 20 (10/27 0654) BP: (117-119)/(68-83) 119/83 (10/27 0654) SpO2:  [100 %] 100 % (10/27 0654)  Physical Exam:  General: alert, cooperative, no distress Lochia: appropriate Uterine Fundus: improvement in tenderness per patient Incision: Honeycomb in place with no strikethrough DVT Evaluation: No evidence of DVT seen on physical exam.  Recent Labs    08/16/24 1655 08/17/24 0457  HGB 10.5* 7.5*  HCT 33.4* 23.7*    Assessment/Plan: ANNABELLA ELFORD is a 33 y.o. G5 now P25 female POD3 s/p rCS and BS at [redacted]w[redacted]d after presenting for prelabor rupture of membranes. Surgery complicated by poor pain control versus severe anxiety requiring conversion to general anesthesia. -POD3: Doing well. Continue routine postpartum care. -Hepatitis C infection: Viral load of 3.43 million on 10/16. Standard PPE precautions. F/u with infectious disease, per Dr. Fleeta Rothman, his team will schedule a visit for her; hopeful they may be able to see her today prior to discharge. Patient uncertain regarding breastfeeding.  -Acute blood loss anemia, clinically significant for this hospitalization: Hgb 10.5-7.5. Now s/p iron infusion; plan every other day PO iron at home.  -history of pre-eclampsia in prior pregnancy: normotensive since delivery -postpartum depression/anxiety:  declines medication management for now. Gave PPD/A precautions.  -HMB: patient desire depo provera  injection prior to discharge, ordered.   Dispo: Anticipate discharge today.   Rubie DELENA Husky, MD 08/19/2024, 11:08 AM

## 2024-08-20 LAB — SURGICAL PATHOLOGY

## 2024-08-21 ENCOUNTER — Other Ambulatory Visit (HOSPITAL_COMMUNITY): Payer: Self-pay

## 2024-08-21 ENCOUNTER — Telehealth: Payer: Self-pay

## 2024-08-21 NOTE — Telephone Encounter (Signed)
 [facility] Patient Advocate Encounter  Insurance verification completed.    The patient is insured through [employer].     Ran test claim for MAVYRET  The current 30 day co-pay is $4.  Ran test claim for EPCLUSA  The current 30 day co-pay is $4.   We will continue to follow to see if copay assistance is needed.  This test claim was processed through [facility]- copay amounts may vary at other pharmacies due to pharmacy/plan contracts, or as the patient moves through the different stages of their insurance plan.

## 2024-08-22 NOTE — Lactation Note (Addendum)
 This note was copied from a baby's chart.  NICU Lactation Consultation Note  Patient Name: Angela Henson Unijb'd Date: 08/22/2024 Age:31 days  Reason for consult: Follow-up assessment; Mother's request; RN request; NICU baby; Early term 59-38.6wks; Other (Comment); 1st time breastfeeding (Hep C (+), tobacco use while pregnant, [name] (+) for benzodiazepines and THC, transfer from the [facility])  SUBJECTIVE NICU RN Mardy NOVAK and Leotis D asked for lactation because Ms. Zwahlen was experiencing breast breast. Visited with family of 31 45/52 weeks old AGA NICU female Angela Henson; she reported she's planning on formula feeding but didn't know what to do to treat her breast pain. She received her Stork pump and started pumping. Let her know that if her goal is to dry out her supply; she shouldn't be emptying the breast but only pumping for comfort. Provided ice packs and a hand pump for this purpose.   She started asking more questions about pumping and is on the fence about providing her breastmilk for baby because she smokes cigarettes. Let her know that the benefits of breastmilk will likely outweigh the risks if all she's worried about is the nicotine , she denied any other substance use.   Reviewed engorgement prevention/treatment and asked her to call for lactation PRN. NICU RN notified regarding her Hep C (+) status and will ask [name] to discard her milk in case her nipples are bleeding (if she decides to start pumping for baby). Advised to start icing round the clock and only pump for comfort if she desires to dry out her milk. No other support person at this time. All questions and concerns answered, family to contact [facility] services PRN.  OBJECTIVE Infant data: Mother's Current Feeding Choice: Formula  O2 Device: Room Air  Infant feeding assessment IDFTS - Readiness: 2   Maternal data: [id number] C-Section, Low Transverse Has patient been taught Hand Expression?: Yes Hand Expression Comments: colostrum  noted Current breast feeding challenges:: NICU admission Does the patient have breastfeeding experience prior to this delivery?: No Pumping frequency: PRN Flange Size: 21 Risk factor for low/delayed milk supply:: C/S, infant separation  WIC Program: Yes WIC Referral Sent?: No What county?: [place] Pump: Received Stork Pump, Manual  ASSESSMENT Infant: Feeding Status: Continuous gastric feedings Feeding method: Continuous Gastric  Maternal: No data recorded INTERVENTIONS/PLAN Interventions: Interventions: Breast feeding basics reviewed; Hand pump; Education; NICU Pumping Log; CDC Guidelines for Breast Pump Cleaning; CDC milk storage guidelines Discharge Education: Engorgement and breast care Tools: Pump; Flanges Pump Education: Setup, frequency, and cleaning; Milk Storage  Plan: Consult Status: PRN   Celest Reitz S Macauley Mossberg 08/22/2024, 6:37 PM

## 2024-08-28 ENCOUNTER — Encounter (HOSPITAL_COMMUNITY)
Admission: RE | Admit: 2024-08-28 | Discharge: 2024-08-28 | Disposition: A | Discharge status: Home / Self Care | Source: Ambulatory Visit | Attending: Obstetrics and Gynecology | Admitting: Obstetrics and Gynecology

## 2024-08-30 ENCOUNTER — Inpatient Hospital Stay (HOSPITAL_COMMUNITY): Admission: RE | Admit: 2024-08-30 | Source: Home / Self Care | Admitting: Obstetrics and Gynecology

## 2024-09-03 ENCOUNTER — Telehealth (HOSPITAL_COMMUNITY): Payer: Self-pay | Admitting: *Deleted

## 2024-09-03 NOTE — Telephone Encounter (Signed)
 09/03/2024  Name: Angela Henson MRN: [id number] DOB: 10/09/1991  Reason for Call:  Transition of Care Hospital Discharge Call  Contact Status: Patient Contact Status: Message  Language assistant needed:          Follow-Up Questions:    Van Postnatal Depression Scale:  In the Past 7 Days:    PHQ2-9 Depression Scale:     Discharge Follow-up:    Post-discharge interventions: NA  Mliss Sieve, RN 09/03/2024 10:41

## 2024-09-10 ENCOUNTER — Other Ambulatory Visit: Payer: Self-pay

## 2024-09-10 ENCOUNTER — Ambulatory Visit (INDEPENDENT_AMBULATORY_CARE_PROVIDER_SITE_OTHER): Admitting: Infectious Diseases

## 2024-09-10 VITALS — BP 116/77 | HR 108 | Temp 97.8°F | Ht 59.0 in | Wt 116.0 lb

## 2024-09-10 DIAGNOSIS — B182 Chronic viral hepatitis C: Secondary | ICD-10-CM

## 2024-09-10 MED ORDER — SOFOSBUVIR-VELPATASVIR 400-100 MG PO TABS
1.0000 | ORAL_TABLET | Freq: Every day | ORAL | 2 refills | Status: AC
  Filled 2024-09-12: qty 28, 28d supply, fill #0
  Filled 2024-10-08: qty 28, 28d supply, fill #1

## 2024-09-10 NOTE — Progress Notes (Signed)
 Patient Name: Angela Henson  Date of Birth: [birth year]  MRN: [id number]  PCP: [facility] (Inactive)  Referring Provider: No ref. provider found, Ph#: N/A   Subjective   CC: New patient - initial evaluation and management of chronic hepatitis C infection.   Discussed the use of AI scribe software for clinical note transcription with the patient, who gave verbal consent to proceed.  History of Present Illness   Angela Henson is a 33 year old female with hepatitis C who presents for new hepatitis C treatment. She is accompanied by her newborn baby and another child.  She has been diagnosed with hepatitis C for a few years. Her RNA level was 3.43 million copies on August 08, 2024. She is currently breastfeeding her newborn.  She recently underwent a cesarean delivery on August 16, 2024, and is experiencing cramps and significant bleeding post-surgery. She has had an infusion check and is scheduled for a postpartum check-up. The bleeding is still present and is being monitored.  She is experiencing fatigue, which she attributes to low iron levels. She has not started iron tablets yet. She experienced ice cravings during pregnancy, which have since resolved. She is not currently on any medications except for occasional Excedrin for headaches.  Her social history includes having multiple tattoos, which she suspects may be the source of her hepatitis C infection. She denies any history of drug use and notes that her husband has been tested and is negative for hepatitis C. She is aware of the low risk of sexual transmission of the virus.       Review of Systems  Constitutional:  Positive for malaise/fatigue.  All other systems reviewed and are negative.   Past Medical History:  Diagnosis Date   Gestational diabetes    Headache(784.0)    Hepatitis C antibody positive    History of pre-eclampsia    Hx of chlamydia infection    Hx of varicella     Outpatient Medications Prior to  Visit  Medication Sig Dispense Refill   acetaminophen  (TYLENOL ) 500 MG tablet Take 1 tablet (500 mg total) by mouth every 6 (six) hours.     ibuprofen  (ADVIL ) 600 MG tablet Take 1 tablet (600 mg total) by mouth every 6 (six) hours.     oxyCODONE  (OXY IR/ROXICODONE ) 5 MG immediate release tablet Take 1 tablet (5 mg total) by mouth every 4 (four) hours as needed for moderate pain (pain score 4-6). 10 tablet 0   ferrous sulfate  325 (65 FE) MG tablet Take 1 tablet (325 mg total) by mouth every other day. (Patient not taking: Reported on 09/10/2024) 30 tablet 1   No facility-administered medications prior to visit.     Allergies  Allergen Reactions   Latex Rash and Other (See Comments)    Yeast infection    Social History   Tobacco Use   Smoking status: Every Day    Current packs/day: 0.00    Average packs/day: 0.5 packs/day for 8.0 years (4.0 ttl pk-yrs)    Types: Cigarettes    Start date: 02/09/2008    Last attempt to quit: 02/09/2016    Years since quitting: 8.5   Smokeless tobacco: Never  Vaping Use   Vaping status: Never Used  Substance Use Topics   Alcohol use: No   Drug use: No    Family History  Problem Relation Age of Onset   Diabetes Father    Hypotension Neg Hx    Asthma Neg Hx  Arthritis Neg Hx    Alcohol abuse Neg Hx    Birth defects Neg Hx    Cancer Neg Hx    COPD Neg Hx    Depression Neg Hx    Drug abuse Neg Hx    Early death Neg Hx    Hearing loss Neg Hx    Heart disease Neg Hx    Hyperlipidemia Neg Hx    Hypertension Neg Hx    Kidney disease Neg Hx    Mental illness Neg Hx    Learning disabilities Neg Hx    Mental retardation Neg Hx    Miscarriages / Stillbirths Neg Hx    Stroke Neg Hx    Vision loss Neg Hx    Varicose Veins Neg Hx          Objective   Today's Vitals   09/10/24 1054  BP: 116/77  Pulse: (!) 108  Temp: 97.8 F (36.6 C)  TempSrc: Temporal  SpO2: 100%  Weight: 116 lb (52.6 kg)  Height: 4' 11 (1.499 m)  PainSc: 0-No  pain   Body mass index is 23.43 kg/m.  Physical Exam Constitutional:      Appearance: Normal appearance. She is not ill-appearing.  HENT:     Mouth/Throat:     Mouth: Mucous membranes are moist.     Pharynx: Oropharynx is clear.  Eyes:     General: No scleral icterus. Cardiovascular:     Rate and Rhythm: Tachycardia present.  Pulmonary:     Effort: Pulmonary effort is normal.  Neurological:     Mental Status: She is oriented to person, place, and time.  Psychiatric:        Mood and Affect: Mood normal.        Thought Content: Thought content normal.        Assessment & Plan:     Chronic hepatitis C virus infection Confirmed by active viral load (interpreted and ordered by other provider). Will order genotype and fibrosis assessment today. No evidence of cirrhosis or significant liver damage. Treatment is recommended to prevent progression to cirrhosis and liver cancer, with a 7-10% risk of cirrhosis and 10% risk of liver cancer in untreated cases.  Epclusa is preferred due to its smaller pill size and ease of administration. Treatment duration is 12 weeks, with follow-up labs to monitor response. Discussed potential side effects including headaches and fatigue, and management strategies. Emphasized the importance of treatment at any stage to prevent severe liver damage. - Ordered blood work for [place] - Prescribed Epclusa, one tablet once daily for 12 weeks - Will repeat viral load test 4-6 weeks into treatment - Will schedule follow-up appointment 12 weeks after last pill to confirm cure - Will coordinate with pharmacist for medication delivery and counseling  Postpartum state following recent cesarean delivery Postpartum state following cesarean delivery on October 24th. Recovery is progressing well with some cramps and bleeding. No breastfeeding. Postpartum check-up is scheduled. - Monitor bleeding and cramps - Attend scheduled postpartum  check-up  Postpartum iron deficiency anemia Likely due to blood loss during cesarean delivery. Symptoms include fatigue and low energy. Iron deficiency can be exacerbated by postpartum bleeding. Discussed potential need for iron infusions and oral iron supplementation. - Discuss iron supplementation and potential infusions with OB - Monitor symptoms and adjust treatment as needed    Orders Placed This Encounter  Procedures   Hepatitis C genotype   Liver Fibrosis, FibroTest-ActiTest   Hepatic function panel   Hepatitis B surface  antibody,quantitative    Standing Status:   Future    Expected Date:   10/22/2024    Expiration Date:   01/20/2025   Hepatitis B Core Antibody, total    Standing Status:   Future    Expected Date:   10/22/2024    Expiration Date:   01/20/2025   Hepatitis A Ab, Total    Standing Status:   Future    Expected Date:   10/22/2024    Expiration Date:   01/20/2025    Meds ordered this encounter  Medications   Sofosbuvir-Velpatasvir (EPCLUSA) 400-100 MG TABS    Sig: Take 1 tablet by mouth daily.    Dispense:  28 tablet    Refill:  2    Return in about 6 weeks (around 10/22/2024).  Corean Fireman, MSN, NP-C [facility] for Infectious Disease [facility]  [email or web address] Pager: [phone] Office: [phone] [facility] Main Line: [phone] *Secure Chat Communication Welcome

## 2024-09-10 NOTE — Patient Instructions (Signed)
 VISIT SUMMARY:  Today, you were seen for a follow-up visit to address your chronic hepatitis C infection and postpartum recovery. We discussed your new hepatitis C treatment plan, your postpartum symptoms following your recent cesarean delivery, and your iron deficiency anemia.  YOUR PLAN:  -CHRONIC HEPATITIS C VIRUS INFECTION: Hepatitis C is a liver infection caused by the hepatitis C virus. We confirmed your diagnosis with viral load and genotype testing, and there is no evidence of severe liver damage. To prevent progression to cirrhosis and liver cancer, we have prescribed Epclusa, which you will take once daily for 12 weeks. We will monitor your response with follow-up labs and schedule a follow-up appointment 12 weeks after you complete the medication to confirm the cure. Potential side effects include headaches and fatigue, and we discussed how to manage these. Blood work has been ordered for insurance purposes, and we will coordinate with the pharmacist for medication delivery and counseling.  -POSTPARTUM STATE FOLLOWING RECENT CESAREAN DELIVERY: You are recovering from a cesarean delivery performed on October 24th. You are experiencing some cramps and bleeding, which are being monitored. It is important to attend your scheduled postpartum check-up to ensure proper recovery.  -POSTPARTUM IRON DEFICIENCY ANEMIA: Iron deficiency anemia is a condition where your body lacks enough iron to produce healthy red blood cells, often leading to fatigue and low energy. This is likely due to blood loss during your cesarean delivery. We discussed the need for iron supplementation and the possibility of iron infusions. Please monitor your symptoms and discuss iron supplementation with your OB.  INSTRUCTIONS:  Please take Epclusa as prescribed, one tablet once daily for 12 weeks. Monitor your symptoms and attend your scheduled postpartum check-up. Discuss iron supplementation and potential infusions with your  OB. We will repeat your viral load test 4-6 weeks into your hepatitis C treatment and schedule a follow-up appointment 12 weeks after you complete the medication to confirm the cure.

## 2024-09-12 ENCOUNTER — Other Ambulatory Visit: Payer: Self-pay

## 2024-09-12 ENCOUNTER — Other Ambulatory Visit (HOSPITAL_COMMUNITY): Payer: Self-pay

## 2024-09-12 NOTE — Progress Notes (Signed)
 Specialty Pharmacy Initial Fill Coordination Note  Angela Henson is a 33 y.o. female contacted today regarding initial fill of specialty medication(s) Sofosbuvir-Velpatasvir   Patient requested Courier to Provider Office   Delivery date: 09/16/24   Verified address: [address] [place] [address]   Medication will be filled on 09/13/24.   Patient is aware of $4.00 copayment.  CC on file

## 2024-09-13 ENCOUNTER — Other Ambulatory Visit: Payer: Self-pay

## 2024-09-13 NOTE — Progress Notes (Signed)
 FYI still pending genotype and fibrosis, but she does not exhibit any concerns for cirrhosis. Low albumin during labor and delivery admission due to pregnancy. Corean stated she was bottle feeding in CC chart (though I see her note saying breastfeeding). [place]

## 2024-09-16 ENCOUNTER — Other Ambulatory Visit (HOSPITAL_COMMUNITY): Payer: Self-pay

## 2024-09-16 ENCOUNTER — Other Ambulatory Visit: Payer: Self-pay

## 2024-09-16 LAB — LIVER FIBROSIS, FIBROTEST-ACTITEST
ALT: 23 U/L (ref 6–29)
Alpha-2-Macroglobulin: 196 mg/dL (ref 106–279)
Apolipoprotein A1: 207 mg/dL — ABNORMAL HIGH (ref 101–198)
Bilirubin: 0.3 mg/dL (ref 0.2–1.2)
Fibrosis Score: 0.04
GGT: 31 U/L (ref 3–50)
Haptoglobin: 159 mg/dL (ref 43–212)
Necroinflammat ACT Score: 0.07
Reference ID: 5808733

## 2024-09-16 LAB — HEPATIC FUNCTION PANEL
AG Ratio: 1.5 (calc) (ref 1.0–2.5)
ALT: 24 U/L (ref 6–29)
AST: 25 U/L (ref 10–30)
Albumin: 4.7 g/dL (ref 3.6–5.1)
Alkaline phosphatase (APISO): 63 U/L (ref 31–125)
Bilirubin, Direct: 0.1 mg/dL (ref 0.0–0.2)
Globulin: 3.1 g/dL (ref 1.9–3.7)
Indirect Bilirubin: 0.2 mg/dL (ref 0.2–1.2)
Total Bilirubin: 0.3 mg/dL (ref 0.2–1.2)
Total Protein: 7.8 g/dL (ref 6.1–8.1)

## 2024-09-16 LAB — HEPATITIS C GENOTYPE

## 2024-09-17 ENCOUNTER — Other Ambulatory Visit (HOSPITAL_COMMUNITY): Payer: Self-pay

## 2024-09-17 ENCOUNTER — Other Ambulatory Visit: Payer: Self-pay

## 2024-09-18 ENCOUNTER — Other Ambulatory Visit (HOSPITAL_COMMUNITY): Payer: Self-pay

## 2024-09-18 ENCOUNTER — Other Ambulatory Visit: Payer: Self-pay

## 2024-09-18 ENCOUNTER — Telehealth: Payer: Self-pay

## 2024-09-18 NOTE — Telephone Encounter (Signed)
 [facility] Patient Advocate Encounter  Patient's medications Epclusa  have been couriered to [facility] from [facility] pharmacy and will be picked up on 09/18/24.  Arland Hutchinson, CPhT Specialty Pharmacy Patient [facility] for Infectious Disease Phone: [phone] Fax:  [phone]

## 2024-09-23 NOTE — Telephone Encounter (Signed)
 LVM for counseling; will try again tomorrow.

## 2024-09-24 ENCOUNTER — Ambulatory Visit: Payer: Self-pay | Admitting: Infectious Diseases

## 2024-09-24 NOTE — Telephone Encounter (Signed)
 Attempted to reach patient to offer counseling on Epclusa . Left a voicemail for patient to return our call.   Feliciano Close, PharmD PGY2 Infectious Diseases Pharmacy Resident  09/24/2024 1:53 PM

## 2024-09-26 ENCOUNTER — Encounter: Payer: Self-pay | Admitting: Pharmacist

## 2024-09-26 NOTE — Telephone Encounter (Signed)
 FYI Cassie and I were out of office when she picked up her Epclusa . She's scheduled with you 10/29/24. I have attempted to call her several times leaving voicemails each time. Will send MyChart with counseling points today though she has not checked recently. Hopefully, everything is laid out very clearly for her already.

## 2024-09-27 ENCOUNTER — Other Ambulatory Visit: Payer: Self-pay | Admitting: Pharmacist

## 2024-09-27 NOTE — Progress Notes (Signed)
 Specialty Pharmacy Initiation Note   Angela Henson is a 33 y.o. female who will be followed by the specialty pharmacy service for RxSp Hepatitis C    Review of administration, indication, effectiveness, safety, potential side effects, storage/disposable, and missed dose instructions occurred today for patient's specialty medication(s) Sofosbuvir -Velpatasvir      Patient/Caregiver did not have any additional questions or concerns.   Patient's therapy is appropriate to: Initiate    Goals Addressed             This Visit's Progress    Achieve virologic cure as evidenced by SVR       Patient is initiating therapy. Patient will be evaluated at upcoming provider appointment to assess progress      Comply with lab assessments       Patient is on track. Patient will adhere to provider and/or lab appointments      Maintain optimal adherence to therapy       Patient is initiating therapy. Patient will be evaluated at upcoming provider appointment to assess progress         Alan JINNY Geralds Specialty Pharmacist

## 2024-10-08 ENCOUNTER — Other Ambulatory Visit: Payer: Self-pay

## 2024-10-10 ENCOUNTER — Other Ambulatory Visit: Payer: Self-pay

## 2024-10-14 ENCOUNTER — Other Ambulatory Visit: Payer: Self-pay

## 2024-10-29 ENCOUNTER — Ambulatory Visit: Admitting: Infectious Diseases
